# Patient Record
Sex: Male | Born: 1955 | Race: Black or African American | Hispanic: No | Marital: Married | Smoking: Former smoker
Health system: Southern US, Community
[De-identification: ages and names within clinical notes are randomized; demographics above are authoritative.]

## PROBLEM LIST (undated history)

## (undated) DIAGNOSIS — I10 Essential (primary) hypertension: Secondary | ICD-10-CM

## (undated) DIAGNOSIS — J9601 Acute respiratory failure with hypoxia: Secondary | ICD-10-CM

## (undated) DIAGNOSIS — I639 Cerebral infarction, unspecified: Secondary | ICD-10-CM

## (undated) HISTORY — DX: Cerebral infarction, unspecified: I63.9

## (undated) HISTORY — PX: SHOULDER SURGERY: SHX246

---

## 1997-12-02 ENCOUNTER — Encounter: Admission: RE | Admit: 1997-12-02 | Discharge: 1997-12-02 | Payer: Self-pay | Admitting: Family Medicine

## 1997-12-19 ENCOUNTER — Encounter: Admission: RE | Admit: 1997-12-19 | Discharge: 1997-12-19 | Payer: Self-pay | Admitting: Family Medicine

## 1997-12-31 ENCOUNTER — Encounter: Payer: Self-pay | Admitting: Cardiology

## 1997-12-31 ENCOUNTER — Ambulatory Visit (HOSPITAL_COMMUNITY): Admission: RE | Admit: 1997-12-31 | Discharge: 1997-12-31 | Payer: Self-pay | Admitting: Cardiology

## 1998-03-11 ENCOUNTER — Encounter: Admission: RE | Admit: 1998-03-11 | Discharge: 1998-03-11 | Payer: Self-pay | Admitting: Family Medicine

## 1998-05-15 ENCOUNTER — Encounter: Admission: RE | Admit: 1998-05-15 | Discharge: 1998-05-15 | Payer: Self-pay

## 1998-05-30 ENCOUNTER — Emergency Department (HOSPITAL_COMMUNITY): Admission: EM | Admit: 1998-05-30 | Discharge: 1998-05-30 | Payer: Self-pay | Admitting: Emergency Medicine

## 1998-06-04 ENCOUNTER — Encounter: Admission: RE | Admit: 1998-06-04 | Discharge: 1998-06-04 | Payer: Self-pay | Admitting: Family Medicine

## 1998-07-02 ENCOUNTER — Encounter: Admission: RE | Admit: 1998-07-02 | Discharge: 1998-07-02 | Payer: Self-pay | Admitting: Family Medicine

## 1998-07-09 ENCOUNTER — Encounter: Admission: RE | Admit: 1998-07-09 | Discharge: 1998-07-09 | Payer: Self-pay | Admitting: Family Medicine

## 1998-07-17 ENCOUNTER — Encounter: Admission: RE | Admit: 1998-07-17 | Discharge: 1998-07-17 | Payer: Self-pay | Admitting: Family Medicine

## 1998-07-18 ENCOUNTER — Encounter: Admission: RE | Admit: 1998-07-18 | Discharge: 1998-07-18 | Payer: Self-pay | Admitting: Family Medicine

## 1998-08-04 ENCOUNTER — Encounter: Admission: RE | Admit: 1998-08-04 | Discharge: 1998-08-04 | Payer: Self-pay | Admitting: Family Medicine

## 2000-10-27 ENCOUNTER — Emergency Department (HOSPITAL_COMMUNITY): Admission: EM | Admit: 2000-10-27 | Discharge: 2000-10-27 | Payer: Self-pay | Admitting: Emergency Medicine

## 2000-10-27 ENCOUNTER — Encounter: Payer: Self-pay | Admitting: Emergency Medicine

## 2004-06-12 ENCOUNTER — Emergency Department (HOSPITAL_COMMUNITY): Admission: EM | Admit: 2004-06-12 | Discharge: 2004-06-12 | Payer: Self-pay | Admitting: Emergency Medicine

## 2004-06-14 ENCOUNTER — Ambulatory Visit: Payer: Self-pay | Admitting: Infectious Diseases

## 2004-06-14 ENCOUNTER — Inpatient Hospital Stay (HOSPITAL_COMMUNITY): Admission: EM | Admit: 2004-06-14 | Discharge: 2004-06-18 | Payer: Self-pay | Admitting: Emergency Medicine

## 2004-06-14 ENCOUNTER — Ambulatory Visit: Payer: Self-pay | Admitting: Internal Medicine

## 2004-06-15 ENCOUNTER — Encounter (INDEPENDENT_AMBULATORY_CARE_PROVIDER_SITE_OTHER): Payer: Self-pay | Admitting: Cardiology

## 2004-06-19 ENCOUNTER — Inpatient Hospital Stay (HOSPITAL_COMMUNITY): Admission: EM | Admit: 2004-06-19 | Discharge: 2004-06-22 | Payer: Self-pay | Admitting: Emergency Medicine

## 2004-06-24 ENCOUNTER — Ambulatory Visit: Payer: Self-pay | Admitting: Internal Medicine

## 2006-12-19 ENCOUNTER — Emergency Department (HOSPITAL_COMMUNITY): Admission: EM | Admit: 2006-12-19 | Discharge: 2006-12-20 | Payer: Self-pay | Admitting: Emergency Medicine

## 2010-04-05 ENCOUNTER — Encounter: Payer: Self-pay | Admitting: Neurology

## 2010-07-31 NOTE — Discharge Summary (Signed)
Luis, Johnson NO.:  [id number]   MEDICAL RECORD NO.:  [id number]          PATIENT TYPE:  INP   LOCATION:                               FACILITY:  [facility]   PHYSICIAN:  Madaline Guthrie, M.D.    DATE OF BIRTH:  [birth year]   DATE OF ADMISSION:  06/19/2004  DATE OF DISCHARGE:                                 DISCHARGE SUMMARY   PRIMARY CARE PHYSICIAN:  Judie Grieve, MD   DISCHARGE DIAGNOSES:  1. Nausea, vomiting, headache secondary to recent right parietal occipital      intracranial hemorrhage.  2. Hypertensive urgency most likely to primary.  3. Right parietal occipital intracranial hemorrhage secondary to      hypertension April 2006.  4. Hyperlipidemia, elevated LDL.  5. History of nausea, vomiting secondary to opiate induced ileus.  6. History of elevated creatinine level secondary to prerenal volume      depletion.  7. History of left shoulder giant rotator cuff tear.     DISCHARGE MEDICATIONS:  1. Norvasc 10 mg p.o. b.i.d.  2. HCTZ 25 mg p.o. daily.  3. Zocor 40 mg p.o. daily.  4. Phenergan 25 mg p.o. q.8h. p.r.n.  5. Dulcolax 15 mg p.o. daily p.r.n. constipation.  6. Dilaudid 4 mg p.o. q.4h. p.r.n. headache.  7. Depakote 500 mg p.o. b.i.d. for headache.     DISPOSITION AND FOLLOW-UP:  Patient is being discharged home in fair  condition to follow up with me, Dr. Shannan Harper on April 12 in [facility] and also has a follow-up with Dr. Pearlean Brownie on June 12 at [facility]  Neurology.  At the time of this follow-up need to follow up on his blood  pressure status on Norvasc and HCTZ, follow up on his BMET to follow up on  potassium level, his creatinine level, his clinical improvement on PT and OT  at home, his clinical improvement in nausea, vomiting, headache, left-sided  hemianopsia.   CONSULTS:  Neurology, Dr. Pearlean Brownie   PROCEDURES:  1. CT scan of the head without contrast which showed no significant change      from prior CT head done on  April 2.  No change in the right occipital      intracranial hematoma.  No new hemorrhage.  2. MRA of the renal arteries negative for renal artery stenosis, left      renal cyst, bilateral patent single renal artery.     HISTORY OF PRESENT ILLNESS:  Luis Johnson is a 55 year old African-American  male with past medical history significant for hypertension, hyperlipidemia  who was recently in the hospital and was diagnosed with right parietal  occipital intracranial hemorrhage.  Returned shortly after discharge with  the complaints of intractable headache and vomiting with associated  dizziness.  He says he went home at 4 p.m. from the hospital, lied down,  then awoke with severe pounding headache behind both eyes and emesis x3  without any hematemesis.  He also had complaints of blurry vision in both  eyes with some visual defect on the left.  His blood pressure check  at that  time in emergency department was 195/117.   PHYSICAL EXAMINATION:  VITAL SIGNS:  Pulse 93, blood pressure 195/117,  temperature 100.2, respiratory rate 22, O2 saturations 95% on room air.  GENERAL:  Within normal limits, in mild distress secondary to headache.  HEENT:  Eyes:  PERRLA.  Extraocular muscles intact.  Mild photophobia.  Left  hemianopsia.  ENT:  Oropharynx clear without any erythema or exudates.  Mucous membranes moist.  NECK:  Supple.  No JVD or carotid bruit.  RESPIRATORY:  Clear to auscultation bilaterally.  No wheezing, rhonchi, or  rales.  CVS:  Regular rate and rhythm.  S1, S2.  GASTROINTESTINAL:  Soft, nontender, nondistended abdomen.  Positive bowel  sounds.  EXTREMITIES:  No clubbing, cyanosis, edema.  SKIN:  Warm and dry.  LYMPH:  No lymphadenopathy.  MUSCULOSKELETAL:  No joint effusions or deformities.  NEUROLOGIC:  No focal deficits.  Alert, awake, oriented x3.  Left temporal  hemianopsia.   ADMISSION LABORATORIES:  BMET with sodium 136, potassium 3.8, chloride 103,  BUN 13,  creatinine 1, glucose 108, bicarbonate 27.  CT scan of the head:  No  evidence of new bleed or CVA extension.   HOSPITAL COURSE:  #1 - HEADACHE WITH NAUSEA AND VOMITING WITH RECENT HISTORY  OF INTRACRANIAL HEMORRHAGE:  Was worrisome for new bleed of extension of the  prior bleed in the right parietal occipital region.  For this reason I did a  stat head CT which was negative for the above.  Headache was thought to be  most likely secondary to persisting irrigation of the dura from the existing  hematoma leading to pain and nausea and vomiting and also could have lead to  elevated blood pressure.  Given his history of vertigo along with headache  and nausea, also thought about vertigo but since he had a recent MRA/MRI in  April 3 which demonstrated wide patent vertebrobasilar system unlikely to be  vertigo.  His left homonomous hemianopsia was stable, did not have any new  focal deficits.  Treated him symptomatically with Phenergan and pain  management with Dilaudid and IV fluids for dehydration.  Monitored him  overnight.  Remained stable.  Nausea and vomiting resolved the next day and  he was discharged home in fair condition.   #2 - HYPERTENSIVE URGENCY:  [name] has recent history of intracerebral  hemorrhage which was thought most likely secondary to hypertension since his  work-up for thromboembolic phenomenon was negative.  Were concerned about  sudden elevation in the blood pressure in spite of medication with Norvasc,  HCTZ so at that point went ahead and did work-up of secondary causes  including renal artery stenosis.  MRA renal arteries was negative for renal  artery stenosis.  Pheochromocytoma work-up was done checking for urine  metanephrine and catecholamine.  Urine metanephrine was 950.  Normetanephrine was slightly elevated at 988, normal range being 50-650 and urine epinephrine was also slightly elevated at 107, normal range below 100.  These slight elevations are  associated with conditions such as emotional  stress, drug interferences, are not significantly high enough to consider  pheochromocytoma.  He was treated with a dose of labetalol in the emergency  department.  His blood pressure brought under pretty good control and later  he was continued on his home medication that is Norvasc 10 b.i.d. and  hydrochlorothiazide with good control of the blood pressure throughout his  hospitalization and was further discharged on the same medication.  The  sudden elevation in the blood pressure could most likely be secondary to  pain, headache, and nausea.  Later he was followed up in the clinic.  At  that time his blood pressure was pretty stable.   #3 - HYPERLIPIDEMIA:  Was continued on Zocor.   #4 - HEADACHE:  Most likely secondary to intracranial hematoma and dural  irritation.  His pain was not well controlled with opiates including  Dilaudid.  He was started on Depakote per neurology recommendation by Dr.  Pearlean Brownie.  Patient had very good control of the pain on Depakote and he was to  be continued on the medication for a couple of months until his headache is  under good control.   Discharge vital signs:  Blood pressure 150/80, heart rate 90, temperature  98.9, O2 saturations 99% on room air.  BMET showed sodium 135, potassium  4.2, chloride 94, BUN 29, creatinine 1.2.   DISCHARGE LABORATORIES:  Urine for catecholamines:  Urine norepinephrine  107, slightly high; urine epinephrine 20, within normal range; urine  dopamine 240, within normal range; urine metanephrine 950 mL with 220,  within normal limits; urine normetanephrine 988, slightly elevated, normal  range 50-650.      ________________________________________  Judie Grieve, MD  ___________________________________________  Madaline Guthrie, M.D.    SY/MEDQ  D:  08/12/2004  T:  08/12/2004  Job:  [id number]   cc:   [facility]   Pramod P. Pearlean Brownie, MD  Fax: [phone]
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
## 2010-07-31 NOTE — Discharge Summary (Signed)
Luis Johnson, Luis Johnson NO.:  [id number]   MEDICAL RECORD NO.:  [id number]          PATIENT TYPE:  INP   LOCATION:  5522                         FACILITY:  [facility]   PHYSICIAN:  Madaline Guthrie, M.D.    DATE OF BIRTH:  1955/09/09   DATE OF ADMISSION:  06/14/2004  DATE OF DISCHARGE:  06/17/2004                                 DISCHARGE SUMMARY   PRIMARY CARE PHYSICIAN:  [facility].   DISCHARGE DIAGNOSES:  1.  Right parietooccipital intracranial hemorrhage, most likely secondary to      hypertension.  2.  Hypertension.  3.  Hyperlipidemia, elevated LDL.  4.  Nausea, vomiting secondary to opiate-induced ileus.  5.  Increased creatinine level secondary to prerenal volume depletion.  6.  History of left shoulder giant rotator cuff tear.   DISCHARGE MEDICATIONS:  1.  Norvasc 10 mg p.o. daily.  2.  HCTZ 25 mg p.o. daily.  3.  Zocor 40 mg p.o. daily.  4.  Phenergan 25 mg p.o. q.8h. p.r.n.  5.  Dulcolax 15 mg p.o. daily p.r.n. constipation.  6.  Dilaudid 4 mg p.o. q.4h. p.r.n. headache.   DISPOSITION AND FOLLOW UP:  Luis Johnson has been discharged home in stable  condition to follow up with Dr. Shannan Harper, in [facility]  on April 12, Wednesday, at 2 p.m.  At that time, need to follow up on  following issues:  1.  His blood pressure.  With a goal systolic blood pressure around 130.  2.  His BMET to follow up on the potassium level.  3.  His creatinine level.  4.  His clinical improvement on PT and OT at home.  5.  His clinical improvement on nausea, vomiting, headache left-sided,      hemianopsia.   CONSULTATIONS:  Neurology, Dr. Sharene Skeans, Dr. Pearlean Brownie.  </PROCEDURES>  1.  CT head without contrast, April 2, showed 4 cm right occipital acute      intracranial hemorrhage with some surrounding edema.  2.  MRI done without contrast done on 3rd April, showed approximately 4 cm      right occipital bleed with surrounding edema, multiple __________   hypertensive-related hemorrhage are seen including microvascular      ischemic change in the white matter along with prominent perivascular      spaces.  No abnormal enhancement to suggest hemorrhagic metastases are      AV malformation.  3.  MR angiogram head without contrast showed mild intracranial      atherosclerotic disease without evidence for vascular malformation or      significant proximal occlusion.  4.  Repeat CT done on 4th April showed stable intraparenchymal hemorrhage      and mild associated edema in the right parietooccipital region.  5.  Bilateral carotid Dopplers done on June 16, 2004, showed right-sided      mild intimal wall thickening in the CCA and left mild intimal wall      thickening in common carotid artery, tortuosity of the proximal and      distal internal carotid artery and bilaterally  no internal carotid      artery stenosis.  Vertebral artery flow antegrade.  6.  2 D echocardiogram done showed overall left ventricular systolic      function within normal limits, left ventricular ejection fraction 55-      65%, no regional wall abnormalities, left ventricular thickness mildly      increased, moderate asymmetric septal hypertrophy.   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  It felt like something hit his left eye.  Pain started  aching around 6-9 intensity, coming around back of the eye, continuous, then  he started noticing visual deficit in his left eye.  He pulled over and  called his wife and was brought to the ER by his wife on that day, that is  June 12, 2004, 2 days prior to admission.  Referred to the ophthalmologist,  eye exam was completely normal and was again seen back  in the ER on the day  of admission with complaints of nausea, vomiting from the day prior 3 times  with speckles of blood in the vomitus.  Otherwise, he did not mention about  any fever, difficulty walking, or weakness in his arms, difficulty  swallowing, slurry speech, or trouble speaking.  He does not have any prior  history of similar episodes in the past.   PHYSICAL EXAMINATION:  ADMISSION VITAL SIGNS:  Pulse 93, blood pressure  173/105, temperature 98.1, respiratory rate 20, O2 saturations 99% on room  air.  GENERAL EXAMINATION:  No acute distress.  EYES:  PERRLA.  Extraocular muscles intact.  ENT:  Oropharynx is clear.  No lesions.  NECK:  Supple, no lymphadenopathy, no JVD.  CHEST:  Clear to auscultation bilaterally.  CVS:  S1, S2, regular rate and rhythm.  No murmur, gallops, or rubs  appreciated.  GI:  Soft, nontender, nondistended abdomen.  Positive bowel sounds.  EXTREMITIES:  No edema, cyanosis, or clubbing.  SKIN:  No lesions appreciated.  LYMPHADENOPATHY:  Negative.  NEUROLOGIC:  He is alert, awake, oriented x 3.  Muscle strength 5/5 in all  extremities.  Sensation is intact.  CRANIAL NERVE EXAMINATION:  Significant for left temporal peripheral  hemianopsia, Babinski's negative.  Vibration and position sense intact.   ADMISSION LABORATORY DATA:  CBC showed hemoglobin 15.3, white count 4.7,  thrombocytes 244, MCV 79.9, __________ showed sodium 135, potassium 3.7,  chloride 106, bicarbonate 23, BUN 5, creatinine 0.9, glucose 105, PT 12.1,  INR 0.9.  CT of head without contrast showed 4 cm right occipital acute  intraparenchymal hemorrhage.  Liver function tests within normal limits.   HOSPITAL COURSE BY ACTIVE PROBLEM LIST:  #1 - RIGHT PARIETOOCCIPITAL  HEMORRHAGE:  Most likely subacute in nature given history of headache,  nausea, vomiting since past 2-3 days and surrounding edema on the CT scan. Neurology consult was done for further management and care.  Given his  age,  there was the possibility of multiple etiologies including hypertension or  ischemic secondary to embolic-atheroembolic phenomenon.  For this reason, he  had a thorough work-up including 2 D echocardiogram, carotid Dopplers, and  hypercoagulability and MRI/MRA to look for cut off in the right posterior  middle cerebral artery branch that would suggest that the hemorrhage was  primarily hemorrhagic or ischemic.  MRA to rule out cerebral artery  aneurysms and minute hemorrhages or other smaller lesions or tumors.  Homocystine level was checked which was normal limits.  Carotid Dopplers  were done which did show atherosclerotic changes.  MRA showed significant  relation to hypertensive etiology for the hemorrhage countering the white  matter changes and prominent perivascular spaces.  2 D echocardiogram was  done which showed normal ejection fraction.  We have considered  transesophageal echocardiogram and Doppler bubble study if embolic  phenomenon was suspected but given the clear picture of hypertensive  etiology, did not do further work-up in this regard.  His blood pressure was  mildly elevated at the time of admission.  He was started on Norvasc.  His  blood pressure came down to 150s in systolic.  Given the subacute nature of  the hemorrhage, started him on HCTZ to control his blood pressure slowly  which came down to 130-140s systolic, and we will continue him on this same  medication after discharge with a blood pressure goal of 130s systolic.  Also routine screening was done for all the risk factors including a fasting  lipid profile which showed elevated cholesterol, so he was started on  statin, Zocor 40 mg daily.  Hemoglobin A1c was also checked which was normal  at 5.9.  Given his risk for recurrent hemorrhage, at this time did not start  him on aspirin, but this needs to be followed up in future.  After 4-6  weeks, he needs to be started on baby aspirin 81 mg daily.  Over  the next  couple of days, he also had a few problems with nausea, vomiting, and severe  headache.  He was started on Phenergan p.r.n. for nausea, vomiting and also  tried on Darvocet and IV morphine for headache, but there was a suspicion of  ileus and nausea and vomiting secondary to that because of the opiate use.  Morphine and Darvocet were discontinued, and he was started on Dilaudid IV,  later on switched to p.o.  His nausea, vomiting, and headache improved over  time, and he was stable at the time of discharge.  He is going to follow up  with me, Dr. Shannan Harper, in the outpatient clinic on April 12 and also with  Dr. Pearlean Brownie in neurology after 2 months.  Also arranged for PT and OT during  his hospital stay.  He did pretty good regarding this matter given his left  temporal hemianopsia which did improve during his hospital stay.  Would  continue him on PT and OT therapy after discharge.  Arranged for home health care for that reason and will follow up on that.   #2 - HYPERTENSION:  Again, a goal of systolic blood pressure at this time.  Given subacute nature, his blood pressure was slowly controlled.  He was  started on Norvasc.  Later on, HCTZ was added the next day which remained  pretty control in 130s systolic, and we will continue him on the same  medication at the time of discharge.   #3 - HYPERLIPIDEMIA:  Elevated LDL.  Started him on Zocor and will discharge  him on the same medication.   #4 - NAUSEA, VOMITING SECONDARY TO NARCOTIC-INDUCED ILEUS:  Morphine and  Darvocet were discontinued.  He was put on Phenergan for symptomatic relief,  also started him on Dilaudid for headache.  His nausea and vomiting resolved  over time and was in pretty good shape at  time of discharge.  A CT scan of  the head was repeated to rule out cerebellar edema or signs of increased  intracranial pressure, concerned about his nausea, which showed stable  hematoma and no significant change.   #5 -  ACUTE RENAL FAILURE:  Most likely secondary to prerenal failure  secondary to volume depletion from nausea, vomiting.  His creatinine level  bumped up from 0.8 to 1.6.  He was started on IV fluids, normal saline, put  on Phenergan for nausea, vomiting.  His nausea, vomiting improved over time.  He received IV fluids.      SY/MEDQ  D:  06/17/2004  T:  06/17/2004  Job:  [id number]   cc:   [facility]   Pramod P. Pearlean Brownie, MD  Fax: [phone]

## 2013-05-20 ENCOUNTER — Emergency Department: Payer: Self-pay | Admitting: Emergency Medicine

## 2014-01-21 ENCOUNTER — Emergency Department (HOSPITAL_COMMUNITY)
Admission: EM | Admit: 2014-01-21 | Discharge: 2014-01-21 | Disposition: A | Discharge status: Home / Self Care | Payer: BC Managed Care – PPO | Attending: Emergency Medicine | Admitting: Emergency Medicine

## 2014-01-21 ENCOUNTER — Emergency Department (HOSPITAL_COMMUNITY): Payer: BC Managed Care – PPO

## 2014-01-21 ENCOUNTER — Encounter (HOSPITAL_COMMUNITY): Payer: Self-pay | Admitting: Emergency Medicine

## 2014-01-21 DIAGNOSIS — R0602 Shortness of breath: Secondary | ICD-10-CM

## 2014-01-21 DIAGNOSIS — J439 Emphysema, unspecified: Secondary | ICD-10-CM | POA: Diagnosis not present

## 2014-01-21 DIAGNOSIS — R51 Headache: Secondary | ICD-10-CM | POA: Diagnosis present

## 2014-01-21 DIAGNOSIS — I10 Essential (primary) hypertension: Secondary | ICD-10-CM | POA: Insufficient documentation

## 2014-01-21 DIAGNOSIS — Z87891 Personal history of nicotine dependence: Secondary | ICD-10-CM | POA: Diagnosis not present

## 2014-01-21 HISTORY — DX: Essential (primary) hypertension: I10

## 2014-01-21 LAB — BASIC METABOLIC PANEL
ANION GAP: 16 — AB (ref 5–15)
BUN: 12 mg/dL (ref 6–23)
CO2: 22 mEq/L (ref 19–32)
Calcium: 9.6 mg/dL (ref 8.4–10.5)
Chloride: 100 mEq/L (ref 96–112)
Creatinine, Ser: 0.78 mg/dL (ref 0.50–1.35)
GLUCOSE: 124 mg/dL — AB (ref 70–99)
POTASSIUM: 3.8 meq/L (ref 3.7–5.3)
SODIUM: 138 meq/L (ref 137–147)

## 2014-01-21 LAB — CBC WITH DIFFERENTIAL/PLATELET
BASOS ABS: 0 10*3/uL (ref 0.0–0.1)
Basophils Relative: 0 % (ref 0–1)
EOS ABS: 0 10*3/uL (ref 0.0–0.7)
Eosinophils Relative: 0 % (ref 0–5)
HCT: 47.8 % (ref 39.0–52.0)
Hemoglobin: 16.1 g/dL (ref 13.0–17.0)
LYMPHS ABS: 1.1 10*3/uL (ref 0.7–4.0)
LYMPHS PCT: 12 % (ref 12–46)
MCH: 26.9 pg (ref 26.0–34.0)
MCHC: 33.7 g/dL (ref 30.0–36.0)
MCV: 79.9 fL (ref 78.0–100.0)
Monocytes Absolute: 0.8 10*3/uL (ref 0.1–1.0)
Monocytes Relative: 9 % (ref 3–12)
NEUTROS PCT: 79 % — AB (ref 43–77)
Neutro Abs: 7.2 10*3/uL (ref 1.7–7.7)
PLATELETS: 253 10*3/uL (ref 150–400)
RBC: 5.98 MIL/uL — AB (ref 4.22–5.81)
RDW: 14.4 % (ref 11.5–15.5)
WBC: 9.2 10*3/uL (ref 4.0–10.5)

## 2014-01-21 MED ORDER — PREDNISONE 20 MG PO TABS
60.0000 mg | ORAL_TABLET | Freq: Once | ORAL | Status: AC
  Administered 2014-01-21: 60 mg via ORAL
  Filled 2014-01-21: qty 3

## 2014-01-21 MED ORDER — AZITHROMYCIN 250 MG PO TABS
500.0000 mg | ORAL_TABLET | Freq: Once | ORAL | Status: AC
  Administered 2014-01-21: 500 mg via ORAL
  Filled 2014-01-21: qty 2

## 2014-01-21 MED ORDER — MAGNESIUM SULFATE 2 GM/50ML IV SOLN
2.0000 g | Freq: Once | INTRAVENOUS | Status: AC
  Administered 2014-01-21: 2 g via INTRAVENOUS
  Filled 2014-01-21: qty 50

## 2014-01-21 MED ORDER — IPRATROPIUM BROMIDE 0.02 % IN SOLN
1.0000 mg | Freq: Once | RESPIRATORY_TRACT | Status: AC
  Administered 2014-01-21: 1 mg via RESPIRATORY_TRACT
  Filled 2014-01-21: qty 5

## 2014-01-21 MED ORDER — SODIUM CHLORIDE 0.9 % IV BOLUS (SEPSIS)
1000.0000 mL | Freq: Once | INTRAVENOUS | Status: AC
  Administered 2014-01-21: 1000 mL via INTRAVENOUS

## 2014-01-21 MED ORDER — ACETAMINOPHEN-CODEINE #3 300-30 MG PO TABS
2.0000 | ORAL_TABLET | Freq: Once | ORAL | Status: AC
  Administered 2014-01-21: 2 via ORAL
  Filled 2014-01-21: qty 2

## 2014-01-21 MED ORDER — AZITHROMYCIN 250 MG PO TABS: 250.0000 mg | ORAL_TABLET | Freq: Every day | ORAL | Status: DC

## 2014-01-21 MED ORDER — PREDNISONE 20 MG PO TABS: 60.0000 mg | ORAL_TABLET | Freq: Every day | ORAL | Status: DC

## 2014-01-21 MED ORDER — ALBUTEROL (5 MG/ML) CONTINUOUS INHALATION SOLN
10.0000 mg/h | INHALATION_SOLUTION | RESPIRATORY_TRACT | Status: DC
  Filled 2014-01-21: qty 20

## 2014-01-21 MED ORDER — IPRATROPIUM BROMIDE 0.02 % IN SOLN
0.5000 mg | Freq: Once | RESPIRATORY_TRACT | Status: DC
  Filled 2014-01-21: qty 2.5

## 2014-01-21 NOTE — ED Provider Notes (Signed)
7:09 AM Patient signed out to me by Dr. Mora Bellmanni. Patient currently receiving hour-long nebulizer. Patient's chest xray unremarkable for acute changes. Patient will be re-evaluated after nebulizer treatment.   10:18 AM Patient reports initially "feeling the same" after his nebulizer. Patient requested to be observed for a bit in the ED. Patient is now "feeling much better" and is able to ambulate without feeling short of breath. Patient will be discharged without further evaluation. Patient instructed to return with worsening or concerning symptoms.   Results for orders placed or performed during the hospital encounter of 01/21/14  CBC with Differential  Result Value Ref Range   WBC 9.2 4.0 - 10.5 K/uL   RBC 5.98 (H) 4.22 - 5.81 MIL/uL   Hemoglobin 16.1 13.0 - 17.0 g/dL   HCT 16.147.8 09.639.0 - 04.552.0 %   MCV 79.9 78.0 - 100.0 fL   MCH 26.9 26.0 - 34.0 pg   MCHC 33.7 30.0 - 36.0 g/dL   RDW 40.914.4 81.111.5 - 91.415.5 %   Platelets 253 150 - 400 K/uL   Neutrophils Relative % 79 (H) 43 - 77 %   Neutro Abs 7.2 1.7 - 7.7 K/uL   Lymphocytes Relative 12 12 - 46 %   Lymphs Abs 1.1 0.7 - 4.0 K/uL   Monocytes Relative 9 3 - 12 %   Monocytes Absolute 0.8 0.1 - 1.0 K/uL   Eosinophils Relative 0 0 - 5 %   Eosinophils Absolute 0.0 0.0 - 0.7 K/uL   Basophils Relative 0 0 - 1 %   Basophils Absolute 0.0 0.0 - 0.1 K/uL  Basic metabolic panel  Result Value Ref Range   Sodium 138 137 - 147 mEq/L   Potassium 3.8 3.7 - 5.3 mEq/L   Chloride 100 96 - 112 mEq/L   CO2 22 19 - 32 mEq/L   Glucose, Bld 124 (H) 70 - 99 mg/dL   BUN 12 6 - 23 mg/dL   Creatinine, Ser 7.820.78 0.50 - 1.35 mg/dL   Calcium 9.6 8.4 - 95.610.5 mg/dL   GFR calc non Af Amer >90 >90 mL/min   GFR calc Af Amer >90 >90 mL/min   Anion gap 16 (H) 5 - 15   Dg Chest Port 1 View  01/21/2014   CLINICAL DATA:  Shortness of breath.  Emphysema.  EXAM: PORTABLE CHEST - 1 VIEW  COMPARISON:  PA and lateral chest 06/14/2004.  FINDINGS: The lungs are emphysematous but appear  clear. Heart size is normal. No pneumothorax pleural effusion.  IMPRESSION: Emphysema without acute disease.   Electronically Signed   By: Drusilla Kannerhomas  Dalessio M.D.   On: 01/21/2014 06:30      Emilia BeckKaitlyn Shanette Tamargo, PA-C 01/21/14 1019  Tomasita CrumbleAdeleke Oni, MD 01/21/14 1627

## 2014-01-21 NOTE — ED Notes (Signed)
Patient here with complaint of cough, shortness of breath, blurred vision, and headache. All of which began Friday. Denies chest pain, nausea, vomiting.

## 2014-01-21 NOTE — ED Notes (Signed)
Pt's hour neb is complete-- pt states he is still congested and not ready to go home. Dr. Patria Maneampos notified.

## 2014-01-21 NOTE — Discharge Instructions (Signed)
Emphysema Luis Johnson, you were seen for cough and wheezing. Continue to take medication as prescribed. Follow up with your primary care doctor within 3 days for continued treatment. If any of your symptoms worsen come back to emergency department immediately for repeat evaluation. Thank you.   Chronic obstructive pulmonary disease (COPD) is a common lung problem. In COPD, the flow of air from the lungs is limited. COPD exacerbations are times that breathing gets worse and you need extra treatment. Without treatment they can be life threatening. If they happen often, your lungs can become more damaged. HOME CARE  Do not smoke.  Avoid tobacco smoke and other things that bother your lungs.  If given, take your antibiotic medicine as told. Finish the medicine even if you start to feel better.  Only take medicines as told by your doctor.  Drink enough fluids to keep your pee (urine) clear or pale yellow (unless your doctor has told you not to).  Use a cool mist machine (vaporizer).  If you use oxygen or a machine that turns liquid medicine into a mist (nebulizer), continue to use them as told.  Keep up with shots (vaccinations) as told by your doctor.  Exercise regularly.  Eat healthy foods.  Keep all doctor visits as told. GET HELP RIGHT AWAY IF:  You are very short of breath and it gets worse.  You have trouble talking.  You have bad chest pain.  You have blood in your spit (sputum).  You have a fever.  You keep throwing up (vomiting).  You feel weak, or you pass out (faint).  You feel confused.  You keep getting worse. MAKE SURE YOU:   Understand these instructions.  Will watch your condition.  Will get help right away if you are not doing well or get worse. Document Released: 02/18/2011 Document Revised: 12/20/2012 Document Reviewed: 11/03/2012 [facility] Patient Information 2015 [place], [place]. This information is not intended to replace advice given to you by  your health care provider. Make sure you discuss any questions you have with your health care provider.

## 2014-01-21 NOTE — ED Notes (Signed)
Kaitlyn, PA in to see pt.

## 2014-01-21 NOTE — ED Provider Notes (Signed)
CSN: [id number]     Arrival date & time 01/21/14  0539 History   None    Chief Complaint  Patient presents with  . Headache  . Cough     (Consider location/radiation/quality/duration/timing/severity/associated sxs/prior Treatment) HPI  Antionette Polesony Freimark is a 58 y.o. male with past medical history of hypertension, emphysema coming in with coughing and shortness of breath. Patient states this began on November 6. His symptoms including subjective fevers and chills, productive cough, shortness of breath. He previously had a viral URI and now his symptoms have gotten worse. He states he rarely has problems with his emphysema. 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 He denies any  chest pain, nausea, vomiting. He is not taking any medication to help his symptoms. Coughing makes his shortness of breath worse. He is now developing associated headache as well. Patient did not describe any blurry vision to meet despite triage notes. Patient has no further complaints.  10 Systems reviewed and are negative for acute change except as noted in the HPI.   Past Medical History  Diagnosis Date  . Hypertension    No past surgical history on file. No family history on file. History  Substance Use Topics  . Smoking status: Former Smoker    Quit date: 01/21/1994  . Smokeless tobacco: Not on file  . Alcohol Use: No    Review of Systems    Allergies  Eggs or egg-derived products  Home Medications   Prior to Admission medications   Not on File   BP 151/96 mmHg  Pulse 102  Temp(Src) 99 F (37.2 C) (Oral)  Ht 5\' 7"  (1.702 m)  Wt 160 lb (72.576 kg)  BMI 25.05 kg/m2  SpO2 95% Physical Exam  Constitutional: He is oriented to person, place, and time. Vital signs are normal. He appears well-developed and well-nourished.  Non-toxic appearance. He does not appear ill. No distress.  HENT:  Head: Normocephalic and atraumatic.  Nose: Nose normal.  Mouth/Throat: Oropharynx is clear and  moist. No oropharyngeal exudate.  Eyes: Conjunctivae and EOM are normal. Pupils are equal, round, and reactive to light. No scleral icterus.  Neck: Normal range of motion. Neck supple. No tracheal deviation, no edema, no erythema and normal range of motion present. No thyroid mass and no thyromegaly present.  Cardiovascular: Normal rate, regular rhythm, S1 normal, S2 normal, normal heart sounds, intact distal pulses and normal pulses.  Exam reveals no gallop and no friction rub.   No murmur heard. Pulses:      Radial pulses are 2+ on the right side, and 2+ on the left side.       Dorsalis pedis pulses are 2+ on the right side, and 2+ on the left side.  Pulmonary/Chest: Effort normal. No respiratory distress. He has wheezes. He has no rhonchi. He has no rales.  Inspiratory and expiratory rhonchi heard bilateral lung fields.  Abdominal: Soft. Normal appearance and bowel sounds are normal. He exhibits no distension, no ascites and no mass. There is no hepatosplenomegaly. There is no tenderness. There is no rebound, no guarding and no CVA tenderness.  Musculoskeletal: Normal range of motion. He exhibits no edema or tenderness.  Lymphadenopathy:    He has no cervical adenopathy.  Neurological: He is alert and oriented to person, place, and time. He has normal strength. No cranial nerve deficit or sensory deficit. He exhibits normal muscle tone. GCS eye subscore is 4. GCS verbal subscore is 5. GCS motor subscore is  6.  Skin: Skin is warm, dry and intact. No petechiae and no rash noted. He is not diaphoretic. No erythema. No pallor.  Psychiatric: He has a normal mood and affect. His behavior is normal. Judgment normal.  Nursing note and vitals reviewed.   ED Course  Procedures (including critical care time) Labs Review Labs Reviewed  CBC WITH DIFFERENTIAL - Abnormal; Notable for the following:    RBC 5.98 (*)    Neutrophils Relative % 79 (*)    All other components within normal limits  BASIC  METABOLIC PANEL - Abnormal; Notable for the following:    Glucose, Bld 124 (*)    Anion gap 16 (*)    All other components within normal limits    Imaging Review Dg Chest Port 1 View  01/21/2014   CLINICAL DATA:  Shortness of breath.  Emphysema.  EXAM: PORTABLE CHEST - 1 VIEW  COMPARISON:  PA and lateral chest 06/14/2004.  FINDINGS: The lungs are emphysematous but appear clear. Heart size is normal. No pneumothorax pleural effusion.  IMPRESSION: Emphysema without acute disease.   Electronically Signed   By: Drusilla Kannerhomas  Dalessio M.D.   On: 01/21/2014 06:30     EKG Interpretation None      MDM   Final diagnoses:  SOB (shortness of breath)    Patient presents emergency department for shortness of breath and coughing. I have suspicion for pneumonia as the patient had a viral URI then suddenly got worse. Really does not have problems with his emphysema but is wheezing and has rhonchi today. We'll treat aggressively with breathing treatments, steroids, magnesium. Chest x-ray is pending. Was given Tylenol 3 with codeine for his fever, pain, and cough.  Patient signed out to Dr. Patria Maneampos and PA.  Finish breathing treatment and likely will be able to bo DC'ed.  I completed DC paperwork with follow up, prednisone and azithromycin course.    Tomasita CrumbleAdeleke Sequoia Witz, MD 01/21/14 [phone]

## 2014-11-11 ENCOUNTER — Emergency Department (HOSPITAL_COMMUNITY): Payer: BLUE CROSS/BLUE SHIELD

## 2014-11-11 ENCOUNTER — Encounter (HOSPITAL_COMMUNITY): Payer: Self-pay | Admitting: Emergency Medicine

## 2014-11-11 ENCOUNTER — Inpatient Hospital Stay (HOSPITAL_COMMUNITY)
Admission: EM | Admit: 2014-11-11 | Discharge: 2014-11-13 | DRG: 064 | Disposition: A | Discharge status: Home / Self Care | Payer: BLUE CROSS/BLUE SHIELD | Attending: Neurology | Admitting: Neurology

## 2014-11-11 ENCOUNTER — Inpatient Hospital Stay (HOSPITAL_COMMUNITY): Payer: BLUE CROSS/BLUE SHIELD

## 2014-11-11 DIAGNOSIS — Z79899 Other long term (current) drug therapy: Secondary | ICD-10-CM

## 2014-11-11 DIAGNOSIS — I10 Essential (primary) hypertension: Secondary | ICD-10-CM | POA: Diagnosis present

## 2014-11-11 DIAGNOSIS — I609 Nontraumatic subarachnoid hemorrhage, unspecified: Secondary | ICD-10-CM | POA: Diagnosis present

## 2014-11-11 DIAGNOSIS — I6523 Occlusion and stenosis of bilateral carotid arteries: Secondary | ICD-10-CM | POA: Diagnosis present

## 2014-11-11 DIAGNOSIS — I611 Nontraumatic intracerebral hemorrhage in hemisphere, cortical: Principal | ICD-10-CM | POA: Diagnosis present

## 2014-11-11 DIAGNOSIS — I619 Nontraumatic intracerebral hemorrhage, unspecified: Secondary | ICD-10-CM

## 2014-11-11 DIAGNOSIS — E785 Hyperlipidemia, unspecified: Secondary | ICD-10-CM | POA: Diagnosis present

## 2014-11-11 DIAGNOSIS — Z87891 Personal history of nicotine dependence: Secondary | ICD-10-CM

## 2014-11-11 DIAGNOSIS — I639 Cerebral infarction, unspecified: Secondary | ICD-10-CM

## 2014-11-11 DIAGNOSIS — Z91012 Allergy to eggs: Secondary | ICD-10-CM | POA: Diagnosis not present

## 2014-11-11 DIAGNOSIS — I6789 Other cerebrovascular disease: Secondary | ICD-10-CM | POA: Diagnosis not present

## 2014-11-11 DIAGNOSIS — G936 Cerebral edema: Secondary | ICD-10-CM | POA: Diagnosis present

## 2014-11-11 LAB — CBC
HEMATOCRIT: 48.5 % (ref 39.0–52.0)
Hemoglobin: 15.9 g/dL (ref 13.0–17.0)
MCH: 26.8 pg (ref 26.0–34.0)
MCHC: 32.8 g/dL (ref 30.0–36.0)
MCV: 81.8 fL (ref 78.0–100.0)
Platelets: 257 10*3/uL (ref 150–400)
RBC: 5.93 MIL/uL — AB (ref 4.22–5.81)
RDW: 14.8 % (ref 11.5–15.5)
WBC: 4.5 10*3/uL (ref 4.0–10.5)

## 2014-11-11 LAB — DIFFERENTIAL
Basophils Absolute: 0 10*3/uL (ref 0.0–0.1)
Basophils Relative: 0 % (ref 0–1)
Eosinophils Absolute: 0.1 10*3/uL (ref 0.0–0.7)
Eosinophils Relative: 2 % (ref 0–5)
LYMPHS PCT: 32 % (ref 12–46)
Lymphs Abs: 1.5 10*3/uL (ref 0.7–4.0)
MONO ABS: 0.3 10*3/uL (ref 0.1–1.0)
MONOS PCT: 7 % (ref 3–12)
NEUTROS ABS: 2.6 10*3/uL (ref 1.7–7.7)
Neutrophils Relative %: 59 % (ref 43–77)

## 2014-11-11 LAB — I-STAT CHEM 8, ED
BUN: 19 mg/dL (ref 6–20)
CALCIUM ION: 1.19 mmol/L (ref 1.13–1.30)
Chloride: 105 mmol/L (ref 101–111)
Creatinine, Ser: 1 mg/dL (ref 0.61–1.24)
GLUCOSE: 93 mg/dL (ref 65–99)
HCT: 53 % — ABNORMAL HIGH (ref 39.0–52.0)
HEMOGLOBIN: 18 g/dL — AB (ref 13.0–17.0)
Potassium: 4.7 mmol/L (ref 3.5–5.1)
Sodium: 141 mmol/L (ref 135–145)
TCO2: 25 mmol/L (ref 0–100)

## 2014-11-11 LAB — COMPREHENSIVE METABOLIC PANEL
ALK PHOS: 69 U/L (ref 38–126)
ALT: 15 U/L — AB (ref 17–63)
AST: 19 U/L (ref 15–41)
Albumin: 3.8 g/dL (ref 3.5–5.0)
Anion gap: 7 (ref 5–15)
BUN: 12 mg/dL (ref 6–20)
CALCIUM: 9.4 mg/dL (ref 8.9–10.3)
CO2: 25 mmol/L (ref 22–32)
CREATININE: 0.98 mg/dL (ref 0.61–1.24)
Chloride: 108 mmol/L (ref 101–111)
Glucose, Bld: 96 mg/dL (ref 65–99)
Potassium: 4.7 mmol/L (ref 3.5–5.1)
Sodium: 140 mmol/L (ref 135–145)
TOTAL PROTEIN: 7.6 g/dL (ref 6.5–8.1)
Total Bilirubin: 0.7 mg/dL (ref 0.3–1.2)

## 2014-11-11 LAB — PROTIME-INR
INR: 0.98 (ref 0.00–1.49)
Prothrombin Time: 13.2 seconds (ref 11.6–15.2)

## 2014-11-11 LAB — I-STAT TROPONIN, ED: TROPONIN I, POC: 0 ng/mL (ref 0.00–0.08)

## 2014-11-11 LAB — APTT: aPTT: 27 seconds (ref 24–37)

## 2014-11-11 MED ORDER — LABETALOL HCL 5 MG/ML IV SOLN: 10.0000 mg | INTRAVENOUS | Status: DC | PRN

## 2014-11-11 MED ORDER — STROKE: EARLY STAGES OF RECOVERY BOOK
Freq: Once | Status: DC
  Filled 2014-11-11: qty 1

## 2014-11-11 MED ORDER — NICARDIPINE HCL IN NACL 20-0.86 MG/200ML-% IV SOLN
3.0000 mg/h | Freq: Once | INTRAVENOUS | Status: AC
  Administered 2014-11-11: 2.5 mg/h via INTRAVENOUS
  Filled 2014-11-11: qty 200

## 2014-11-11 MED ORDER — ACETAMINOPHEN 650 MG RE SUPP: 650.0000 mg | RECTAL | Status: DC | PRN

## 2014-11-11 MED ORDER — ACETAMINOPHEN 325 MG PO TABS: 650.0000 mg | ORAL_TABLET | ORAL | Status: DC | PRN

## 2014-11-11 MED ORDER — AMLODIPINE BESYLATE 5 MG PO TABS
10.0000 mg | ORAL_TABLET | Freq: Once | ORAL | Status: AC
  Administered 2014-11-11: 10 mg via ORAL
  Filled 2014-11-11: qty 2

## 2014-11-11 MED ORDER — SODIUM CHLORIDE 0.9 % IV SOLN: INTRAVENOUS | Status: DC

## 2014-11-11 MED ORDER — PANTOPRAZOLE SODIUM 40 MG IV SOLR
40.0000 mg | Freq: Every day | INTRAVENOUS | Status: DC
  Administered 2014-11-11: 40 mg via INTRAVENOUS
  Filled 2014-11-11 (×2): qty 40

## 2014-11-11 MED ORDER — BUTALBITAL-APAP-CAFFEINE 50-325-40 MG PO TABS
1.0000 | ORAL_TABLET | ORAL | Status: DC | PRN
  Administered 2014-11-11 – 2014-11-12 (×3): 1 via ORAL
  Filled 2014-11-11 (×3): qty 1

## 2014-11-11 MED ORDER — SENNOSIDES-DOCUSATE SODIUM 8.6-50 MG PO TABS
1.0000 | ORAL_TABLET | Freq: Two times a day (BID) | ORAL | Status: DC
  Administered 2014-11-12 – 2014-11-13 (×2): 1 via ORAL
  Filled 2014-11-11 (×5): qty 1

## 2014-11-11 NOTE — ED Notes (Signed)
Attempted report x1. 

## 2014-11-11 NOTE — ED Notes (Signed)
Pt taken to MRI  

## 2014-11-11 NOTE — ED Notes (Signed)
Meal tray ordered per EDP 

## 2014-11-11 NOTE — ED Notes (Signed)
Attempted report 

## 2014-11-11 NOTE — ED Notes (Signed)
Pt sts blurred vision and left arm numbness upon waking this am; no obvious deficits noted

## 2014-11-11 NOTE — ED Notes (Signed)
Verbal order received from Dr Leroy Kennedy to d/c carden drip

## 2014-11-11 NOTE — ED Provider Notes (Signed)
CSN: [id number]     Arrival date & time 11/11/14  1429 History   First MD Initiated Contact with Patient 11/11/14 1542     Chief Complaint  Patient presents with  . Blurred Vision  . Numbness     (Consider location/radiation/quality/duration/timing/severity/associated sxs/prior Treatment) Patient is a 59 y.o. male presenting with neurologic complaint.  Neurologic Problem This is a new problem. The current episode started yesterday. The problem occurs constantly. The problem has been gradually improving. Associated symptoms include headaches, numbness and a visual change. Pertinent negatives include no abdominal pain, arthralgias, chest pain, chills, congestion, fever, myalgias, nausea, sore throat, vomiting or weakness. Associated symptoms comments: Ataxia. Nothing aggravates the symptoms. He has tried nothing for the symptoms.    Past Medical History  Diagnosis Date  . Hypertension    History reviewed. No pertinent past surgical history. History reviewed. No pertinent family history. Social History  Substance Use Topics  . Smoking status: Former Smoker    Quit date: 01/21/1994  . Smokeless tobacco: None  . Alcohol Use: No    Review of Systems  Constitutional: Negative for fever and chills.  HENT: Negative for congestion and sore throat.   Eyes: Negative for visual disturbance.  Respiratory: Negative for shortness of breath and wheezing.   Cardiovascular: Negative for chest pain.  Gastrointestinal: Negative for nausea, vomiting, abdominal pain, diarrhea and constipation.  Genitourinary: Negative for dysuria and difficulty urinating.  Musculoskeletal: Negative for myalgias and arthralgias.  Skin: Negative for wound.  Neurological: Positive for speech difficulty, numbness and headaches. Negative for syncope and weakness.  Psychiatric/Behavioral: Negative for behavioral problems.  All other systems reviewed and are negative.     Allergies  Eggs or egg-derived  products  Home Medications   Prior to Admission medications   Medication Sig Start Date End Date Taking? Authorizing Provider  acetaminophen (TYLENOL) 325 MG tablet Take 650 mg by mouth every 6 (six) hours as needed for mild pain.   Yes Historical Provider, MD  amLODipine (NORVASC) 10 MG tablet Take 10 mg by mouth daily.   Yes Historical Provider, MD  atorvastatin (LIPITOR) 40 MG tablet Take 40 mg by mouth daily.   Yes Historical Provider, MD  albuterol-ipratropium (COMBIVENT) 18-103 MCG/ACT inhaler Inhale 1 puff into the lungs every 4 (four) hours as needed for wheezing or shortness of breath.    Historical Provider, MD  azithromycin (ZITHROMAX) 250 MG tablet Take 1 tablet (250 mg total) by mouth daily. Patient not taking: Reported on 11/11/2014 01/21/14   Tomasita Crumble, MD  predniSONE (DELTASONE) 20 MG tablet Take 3 tablets (60 mg total) by mouth daily. Patient not taking: Reported on 11/11/2014 01/21/14   Tomasita Crumble, MD   BP 155/94 mmHg  Pulse 98  Temp(Src) 98.9 F (37.2 C) (Oral)  Resp 18  SpO2 96% Physical Exam  Constitutional: He is oriented to person, place, and time. He appears well-developed and well-nourished.  HENT:  Head: Normocephalic and atraumatic.  Eyes: EOM are normal.  Neck: Normal range of motion.  Cardiovascular: Normal rate, regular rhythm and normal heart sounds.   No murmur heard. Pulmonary/Chest: Effort normal and breath sounds normal. No respiratory distress.  Abdominal: Soft. There is no tenderness.  Musculoskeletal: He exhibits no edema.  Neurological: He is alert and oriented to person, place, and time. He has normal strength and normal reflexes. A cranial nerve deficit and sensory deficit is present. He displays a negative Romberg sign. Coordination abnormal. Gait normal. GCS eye subscore is 4. GCS verbal  subscore is 5. GCS motor subscore is 6.  Skin: No rash noted. He is not diaphoretic.    ED Course  Procedures (including critical care time) Labs  Review Labs Reviewed  CBC - Abnormal; Notable for the following:    RBC 5.93 (*)    All other components within normal limits  COMPREHENSIVE METABOLIC PANEL - Abnormal; Notable for the following:    ALT 15 (*)    All other components within normal limits  I-STAT CHEM 8, ED - Abnormal; Notable for the following:    Hemoglobin 18.0 (*)    HCT 53.0 (*)    All other components within normal limits  MRSA PCR SCREENING  PROTIME-INR  APTT  DIFFERENTIAL  URINE RAPID DRUG SCREEN, HOSP PERFORMED  URINALYSIS, ROUTINE W REFLEX MICROSCOPIC (NOT AT [facility])  I-STAT TROPOININ, ED    Imaging Review Ct Head Wo Contrast  11/11/2014   CLINICAL DATA:  Loss of vision to both eyes and left arm numbness last night.  EXAM: CT HEAD WITHOUT CONTRAST  TECHNIQUE: Contiguous axial images were obtained from the base of the skull through the vertex without intravenous contrast.  COMPARISON:  December 19, 2006  FINDINGS: There is acute hemorrhage in the left parietal occipital lobe measuring 1.7 x 1.9 cm. There is generalized bilateral cerebral edema with low-density in the bilateral frontal/parietal and temporal white matter which should present on prior CT of 2008. Left basal ganglia calcification is unchanged compared to prior exam. There is no midline shift or hydrocephalus. The bony calvarium is intact. There is a right maxillary sinus retention cyst.  IMPRESSION: Acute hemorrhage involving the left parietal occipital lobe measuring 1.7 x 1.9 cm. Generalized changes of bilateral cerebrum as described. The findings raises the possibility of posterior reversible encephalopathy syndrome. Further evaluation with MRI is recommended.  These results were called by telephone at the time of interpretation on 11/11/2014 at 3:49 pm to Dr. Cathren Laine , who verbally acknowledged these results.   Electronically Signed   By: Sherian Rein M.D.   On: 11/11/2014 15:52   [name] Head Wo Contrast  11/11/2014   CLINICAL DATA:  59 year old  hypertensive male with headache and left arm numbness with slurred speech. Subsequent encounter.  EXAM: MRI HEAD WITHOUT CONTRAST  MRA HEAD WITHOUT CONTRAST  TECHNIQUE: Multiplanar, multiecho pulse sequences of the brain and surrounding structures were obtained without intravenous contrast. Angiographic images of the head were obtained using MRA technique without contrast.  COMPARISON:  11/11/2014, 12/19/2006 and 06/18/2004 head CT.  FINDINGS: MRI HEAD FINDINGS  Acute left occipital lobe 2.4 x 2 x 2.2 cm hematoma with surrounding vasogenic edema.  Remote insult with blood-stained encephalomalacia posterior inferior left temporal- occipital lobe.  Remote hemorrhage (noted as acute on 06/18/2004 CT) right parietal-occipital lobe and now with blood-stained encephalomalacia.  The cause of these areas of hemorrhage is indeterminate. In this hypertensive patient with significant peri vascular spaces and white matter type changes, findings may be related to hypertensive bleeds. Result of underlying vasculitis or amyloid angiopathy are possibilities. With involvement of the posterior circulation, posterior reversible encephalopathy syndrome with subsequent bleeds not entirely excluded although a secondary consideration.  The major dural sinuses appear patent and therefore hemorrhage from venous occlusion is a less likely consideration.  No hydrocephalus.  No intracranial mass lesion seen from the above described findings. Contrast was not administered.  Cerebellar tonsils minimally low lying but within the range of normal limits. Although there appears to be a slightly sagging appearance  of the pons, no other findings of intracranial hypotension.  Orbital structures unremarkable.  Polypoid complex opacification inferior right maxillary sinus.  MRA HEAD FINDINGS  Anterior circulation without medium or large size vessel significant stenosis or occlusion.  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  Ectatic codominant vertebral arteries with slight narrowing but without high-grade stenosis.  Mild irregularity posterior inferior cerebellar arteries.  Mild ectasia mild narrowing basilar artery.  Nonvisualized anterior inferior cerebellar arteries.  Posterior cerebral artery branch vessel irregularity bilaterally.  No abnormal vessels seen surrounding the left occipital lobe acute hemorrhage.  IMPRESSION: MRI HEAD FINDINGS  Acute left occipital lobe 2.4 x 2 x 2.2 cm hematoma with surrounding vasogenic edema.  Remote insult with blood-stained encephalomalacia posterior inferior left temporal- occipital lobe.  Remote hemorrhage (noted as acute on 06/18/2004 CT) right parietal-occipital lobe and now with blood-stained encephalomalacia.  The cause of these areas of hemorrhage is indeterminate. In this hypertensive patient with significant peri vascular spaces and white matter type changes, findings may be related to hypertensive bleeds. Result of underlying vasculitis or amyloid angiopathy are possibilities. With involvement of the posterior circulation, posterior reversible encephalopathy syndrome with subsequent bleeds not entirely excluded although a secondary consideration.  The major dural sinuses appear patent and therefore hemorrhage from venous occlusion is a less likely consideration.  Cerebellar tonsils minimally low lying but within the range of normal limits. Although there appears to be a slightly sagging appearance of the pons, no other findings of intracranial hypotension.  Polypoid complex opacification inferior right maxillary sinus.  MRA HEAD FINDINGS  No abnormal vessels seen surrounding the left occipital lobe acute hemorrhage.  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  Anterior circulation without medium or large size vessel significant stenosis or occlusion.  Ectatic codominant vertebral arteries with slight  narrowing but without high-grade stenosis.  Mild irregularity posterior inferior cerebellar arteries.  Mild ectasia mild narrowing basilar artery.  Nonvisualized anterior inferior cerebellar arteries.  Posterior cerebral artery branch vessel irregularity bilaterally.   Electronically Signed   By: Lacy Duverney M.D.   On: 11/11/2014 21:57   Mr Brain Wo Contrast  11/11/2014   CLINICAL DATA:  59 year old hypertensive male with headache and left arm numbness with slurred speech. Subsequent encounter.  EXAM: MRI HEAD WITHOUT CONTRAST  MRA HEAD WITHOUT CONTRAST  TECHNIQUE: Multiplanar, multiecho pulse sequences of the brain and surrounding structures were obtained without intravenous contrast. Angiographic images of the head were obtained using MRA technique without contrast.  COMPARISON:  11/11/2014, 12/19/2006 and 06/18/2004 head CT.  FINDINGS: MRI HEAD FINDINGS  Acute left occipital lobe 2.4 x 2 x 2.2 cm hematoma with surrounding vasogenic edema.  Remote insult with blood-stained encephalomalacia posterior inferior left temporal- occipital lobe.  Remote hemorrhage (noted as acute on 06/18/2004 CT) right parietal-occipital lobe and now with blood-stained encephalomalacia.  The cause of these areas of hemorrhage is indeterminate. In this hypertensive patient with significant peri vascular spaces and white matter type changes, findings may be related to hypertensive bleeds. Result of underlying vasculitis or amyloid angiopathy are possibilities. With involvement of the posterior circulation, posterior reversible encephalopathy syndrome with subsequent bleeds not entirely excluded although a secondary consideration.  The major dural sinuses appear patent and therefore hemorrhage from venous occlusion is a less likely consideration.  No hydrocephalus.  No intracranial mass lesion seen from the above described findings. Contrast was not administered.  Cerebellar tonsils minimally  low lying but within the range of normal  limits. Although there appears to be a slightly sagging appearance of the pons, no other findings of intracranial hypotension.  Orbital structures unremarkable.  Polypoid complex opacification inferior right maxillary sinus.  MRA HEAD FINDINGS  Anterior circulation without medium or large size vessel significant stenosis or occlusion.  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  Ectatic codominant vertebral arteries with slight narrowing but without high-grade stenosis.  Mild irregularity posterior inferior cerebellar arteries.  Mild ectasia mild narrowing basilar artery.  Nonvisualized anterior inferior cerebellar arteries.  Posterior cerebral artery branch vessel irregularity bilaterally.  No abnormal vessels seen surrounding the left occipital lobe acute hemorrhage.  IMPRESSION: MRI HEAD FINDINGS  Acute left occipital lobe 2.4 x 2 x 2.2 cm hematoma with surrounding vasogenic edema.  Remote insult with blood-stained encephalomalacia posterior inferior left temporal- occipital lobe.  Remote hemorrhage (noted as acute on 06/18/2004 CT) right parietal-occipital lobe and now with blood-stained encephalomalacia.  The cause of these areas of hemorrhage is indeterminate. In this hypertensive patient with significant peri vascular spaces and white matter type changes, findings may be related to hypertensive bleeds. Result of underlying vasculitis or amyloid angiopathy are possibilities. With involvement of the posterior circulation, posterior reversible encephalopathy syndrome with subsequent bleeds not entirely excluded although a secondary consideration.  The major dural sinuses appear patent and therefore hemorrhage from venous occlusion is a less likely consideration.  Cerebellar tonsils minimally low lying but within the range of normal limits. Although there appears to be a slightly sagging appearance of the pons, no other findings of intracranial hypotension.   Polypoid complex opacification inferior right maxillary sinus.  MRA HEAD FINDINGS  No abnormal vessels seen surrounding the left occipital lobe acute hemorrhage.  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  Anterior circulation without medium or large size vessel significant stenosis or occlusion.  Ectatic codominant vertebral arteries with slight narrowing but without high-grade stenosis.  Mild irregularity posterior inferior cerebellar arteries.  Mild ectasia mild narrowing basilar artery.  Nonvisualized anterior inferior cerebellar arteries.  Posterior cerebral artery branch vessel irregularity bilaterally.   Electronically Signed   By: Lacy Duverney M.D.   On: 11/11/2014 21:57   I have personally reviewed and evaluated these images and lab results as part of my medical decision-making.   EKG Interpretation   Date/Time:  Monday November 11 2014 15:46:22 EDT Ventricular Rate:  85 PR Interval:  153 QRS Duration: 80 QT Interval:  352 QTC Calculation: 418 R Axis:   47 Text Interpretation:  Sinus rhythm RAE, consider biatrial enlargement No  significant change since last tracing Confirmed by Rhunette Croft, MD, Janey Genta  [phone]) on 11/11/2014 4:30:26 PM      MDM   Final diagnoses:  Subarachnoid bleed     Patient is a 59 year old male with a history of hypertension that presents with left upper extremity numbness that started last night. Patient awoke this morning at 3 AM with a headache and had left eye blurriness difficulty with ambulation and slurred speech. Patient also had a headache at that time. Patient states that his headache is resolved his ataxia has symmetrically improved however he still has left hand numbness and left eye blurriness. On arrival to the ED the patient is hypertensive otherwise vitals are stable. In triage CT scan was performed which shows a left occipital intraparenchymal hemorrhage. On exam the patient has dysmetria of the left  hand, blurred vision of the left eye, decreased sensation in the left hand. Patient has normal strength in bilateral upper and lower extremity's. Patient is able to ambulate without any ataxia. Patient has a negative Romberg. We will send screening labs and consult neurosurgery. Neurosurgery was consult and evaluate the case and feel that they have no additional intervention at this time. Neurology was consult it and they will admit the patient to their service. Patient's blood pressure was controlled with boluses and did not require a Cardene drip.   Beverely Risen, MD 11/12/14 1610  Derwood Kaplan, MD 11/15/14 1900

## 2014-11-11 NOTE — ED Notes (Signed)
RES at bedside 

## 2014-11-11 NOTE — Consult Note (Addendum)
Referring Physician: ED    Chief Complaint: left sided numbness, blurred vision, dysarthria, left occipital hemorrhage on CT brain  HPI:                                                                                                                                         Luis Johnson is an 59 y.o. male with a past medical history significant for HTN, presents to the ED for evaluation of the above stated symptoms. Patient indicated that he went to bed last night " with a small HA" and when he woke up this morning his left arm was numb, speech was slurred, his vision was blurry, and the HA was still there. Denies associated vertigo, double vision, focal weakness, imbalance, or confusion. Further, no recent head trauma, no use of anticoagulants. CT brain was personally reviewed and showed acute hemorrhage involving the left parietal occipital lobe measuring 1.7 x 1.9 cm as well as generalized bilateral cerebral edema with low-density in the bilateral frontal/parietal and temporal white matter. No hydrocephalus or midline shifting. Serologies reviewed: platelets 253, PTT 2, INR 0.98, PTT 27 BP 155/94 mmHg in the ED  ICH volume 4.9cc ICH score: 0 Past Medical History  Diagnosis Date  . Hypertension     History reviewed. No pertinent past surgical history.  History reviewed. No pertinent family history. Social History:  reports that he quit smoking about 20 years ago. He does not have any smokeless tobacco history on file. He reports that he does not drink alcohol or use illicit drugs.  Family history: mother had brain aneurysm. No epilepsy, brain tumor, or MS Allergies:  Allergies  Allergen Reactions  . Eggs Or Egg-Derived Products Swelling    Medications:                                                                                                                           I have reviewed the patient's current medications.  ROS:  History obtained from chart review and the patient  General ROS: negative for - chills, fatigue, fever, night sweats, weight gain or weight loss Psychological ROS: negative for - behavioral disorder, hallucinations, memory difficulties, mood swings or suicidal ideation Ophthalmic ROS: negative for - blurry vision, double vision, eye pain or loss of vision ENT ROS: negative for - epistaxis, nasal discharge, oral lesions, sore throat, tinnitus or vertigo Allergy and Immunology ROS: negative for - hives or itchy/watery eyes Hematological and Lymphatic ROS: negative for - bleeding problems, bruising or swollen lymph nodes Endocrine ROS: negative for - galactorrhea, hair pattern changes, polydipsia/polyuria or temperature intolerance Respiratory ROS: negative for - cough, hemoptysis, shortness of breath or wheezing Cardiovascular ROS: negative for - chest pain, dyspnea on exertion, edema or irregular heartbeat Gastrointestinal ROS: negative for - abdominal pain, diarrhea, hematemesis, nausea/vomiting or stool incontinence Genito-Urinary ROS: negative for - dysuria, hematuria, incontinence or urinary frequency/urgency Musculoskeletal ROS: negative for - joint swelling or muscular weakness Neurological ROS: as noted in HPI Dermatological ROS: negative for rash and skin lesion changes  Physical exam:  Constitutional: well developed, pleasant male in no apparent distress. Blood pressure 147/86, pulse 73, temperature 98.1 F (36.7 C), temperature source Oral, resp. rate 15, SpO2 97 %. Eyes: no jaundice or exophthalmos.  Head: normocephalic. Neck: supple, no bruits, no JVD. Cardiac: no murmurs. Lungs: clear. Abdomen: soft, no tender, no mass. Extremities: no edema, clubbing, or cyanosis.  Skin: no rash  Neurologic Examination:                                                                                                       General: Mental Status: Alert, oriented, thought content appropriate.  Speech fluent without evidence of aphasia.  Able to follow 3 step commands without difficulty. Cranial Nerves: II: Discs flat bilaterally; Visual fields grossly normal, pupils equal, round, reactive to light and accommodation III,IV, VI: ptosis not present, extra-ocular motions intact bilaterally V,VII: smile symmetric, facial light touch sensation normal bilaterally VIII: hearing normal bilaterally IX,X: uvula rises symmetrically XI: bilateral shoulder shrug XII: midline tongue extension without atrophy or fasciculations  Motor: Right : Upper extremity   5/5    Left:     Upper extremity   5/5  Lower extremity   5/5     Lower extremity   5/5 Tone and bulk:normal tone throughout; no atrophy noted Sensory: Pinprick and light touch intact throughout, bilaterally Deep Tendon Reflexes:  Right: Upper Extremity   Left: Upper extremity   biceps (C-5 to C-6) 2/4   biceps (C-5 to C-6) 2/4 tricep (C7) 2/4    triceps (C7) 2/4 Brachioradialis (C6) 2/4  Brachioradialis (C6) 2/4  Lower Extremity Lower Extremity  quadriceps (L-2 to L-4) 2/4   quadriceps (L-2 to L-4) 2/4 Achilles (S1) 2/4   Achilles (S1) 2/4  Plantars: Right: downgoing   Left: downgoing Cerebellar: normal finger-to-nose,  normal heel-to-shin test Gait:  No tested due to' \multiple'  leads     Results for orders placed or performed during the hospital encounter of 11/11/14 (from the past 48 hour(s))  I-stat troponin, ED (not at [facility], [facility])     Status: None   Collection Time: 11/11/14  3:07 PM  Result Value Ref Range   Troponin i, poc 0.00 0.00 - 0.08 ng/mL   Comment 3            Comment: Due to the release kinetics of cTnI, a negative result within the first hours of the onset of symptoms does not rule out myocardial infarction with certainty. If myocardial infarction is still suspected, repeat the test at appropriate intervals.   Protime-INR      Status: None   Collection Time: 11/11/14  3:09 PM  Result Value Ref Range   Prothrombin Time 13.2 11.6 - 15.2 seconds   INR 0.98 0.00 - 1.49  APTT     Status: None   Collection Time: 11/11/14  3:09 PM  Result Value Ref Range   aPTT 27 24 - 37 seconds  CBC     Status: Abnormal   Collection Time: 11/11/14  3:09 PM  Result Value Ref Range   WBC 4.5 4.0 - 10.5 K/uL   RBC 5.93 (H) 4.22 - 5.81 MIL/uL   Hemoglobin 15.9 13.0 - 17.0 g/dL   HCT 48.5 39.0 - 52.0 %   MCV 81.8 78.0 - 100.0 fL   MCH 26.8 26.0 - 34.0 pg   MCHC 32.8 30.0 - 36.0 g/dL   RDW 14.8 11.5 - 15.5 %   Platelets 257 150 - 400 K/uL  Differential     Status: None   Collection Time: 11/11/14  3:09 PM  Result Value Ref Range   Neutrophils Relative % 59 43 - 77 %   Neutro Abs 2.6 1.7 - 7.7 K/uL   Lymphocytes Relative 32 12 - 46 %   Lymphs Abs 1.5 0.7 - 4.0 K/uL   Monocytes Relative 7 3 - 12 %   Monocytes Absolute 0.3 0.1 - 1.0 K/uL   Eosinophils Relative 2 0 - 5 %   Eosinophils Absolute 0.1 0.0 - 0.7 K/uL   Basophils Relative 0 0 - 1 %   Basophils Absolute 0.0 0.0 - 0.1 K/uL  Comprehensive metabolic panel     Status: Abnormal   Collection Time: 11/11/14  3:09 PM  Result Value Ref Range   Sodium 140 135 - 145 mmol/L   Potassium 4.7 3.5 - 5.1 mmol/L   Chloride 108 101 - 111 mmol/L   CO2 25 22 - 32 mmol/L   Glucose, Bld 96 65 - 99 mg/dL   BUN 12 6 - 20 mg/dL   Creatinine, Ser 0.98 0.61 - 1.24 mg/dL   Calcium 9.4 8.9 - 10.3 mg/dL   Total Protein 7.6 6.5 - 8.1 g/dL   Albumin 3.8 3.5 - 5.0 g/dL   AST 19 15 - 41 U/L   ALT 15 (L) 17 - 63 U/L   Alkaline Phosphatase 69 38 - 126 U/L   Total Bilirubin 0.7 0.3 - 1.2 mg/dL   GFR calc non Af Amer >60 >60 mL/min   GFR calc Af Amer >60 >60 mL/min    Comment: (NOTE) The eGFR has been calculated using the CKD EPI equation. This calculation has not been validated in all clinical situations. eGFR's persistently <60 mL/min signify possible Chronic Kidney Disease.    Anion  gap 7 5 - 15  I-Stat Chem 8, ED  (not at [facility], [facility])     Status: Abnormal   Collection Time: 11/11/14  3:09 PM  Result Value Ref Range  Sodium 141 135 - 145 mmol/L   Potassium 4.7 3.5 - 5.1 mmol/L   Chloride 105 101 - 111 mmol/L   BUN 19 6 - 20 mg/dL   Creatinine, Ser 1.00 0.61 - 1.24 mg/dL   Glucose, Bld 93 65 - 99 mg/dL   Calcium, Ion 1.19 1.13 - 1.30 mmol/L   TCO2 25 0 - 100 mmol/L   Hemoglobin 18.0 (H) 13.0 - 17.0 g/dL   HCT 53.0 (H) 39.0 - 52.0 %   Ct Head Wo Contrast  11/11/2014   CLINICAL DATA:  Loss of vision to both eyes and left arm numbness last night.  EXAM: CT HEAD WITHOUT CONTRAST  TECHNIQUE: Contiguous axial images were obtained from the base of the skull through the vertex without intravenous contrast.  COMPARISON:  December 19, 2006  FINDINGS: There is acute hemorrhage in the left parietal occipital lobe measuring 1.7 x 1.9 cm. There is generalized bilateral cerebral edema with low-density in the bilateral frontal/parietal and temporal white matter which should present on prior CT of 2008. Left basal ganglia calcification is unchanged compared to prior exam. There is no midline shift or hydrocephalus. The bony calvarium is intact. There is a right maxillary sinus retention cyst.  IMPRESSION: Acute hemorrhage involving the left parietal occipital lobe measuring 1.7 x 1.9 cm. Generalized changes of bilateral cerebrum as described. The findings raises the possibility of posterior reversible encephalopathy syndrome. Further evaluation with MRI is recommended.  These results were called by telephone at the time of interpretation on 11/11/2014 at 3:49 pm to Dr. Lajean Saver , who verbally acknowledged these results.   Electronically Signed   By: Abelardo Diesel M.D.   On: 11/11/2014 15:52     Assessment: 59 y.o. male presents to the ED with new onset left sided numbness, blurred vision, dysarthria, and CT brain with an acute hemorrhage involving the left parietal occipital lobe measuring 1.7  x 1.9 cm as well as generalized bilateral cerebral edema with low-density in the bilateral frontal/parietal and temporal white matter. No hydrocephalus or midline shifting. Findings raising concern for PRES. ? Hemorrhagic mass Ordered MRI/MRA brain. BP control. No antiplatelets or anticoagulants. Stroke team will follow up tomorrow.   Stroke Risk Factors - age, HTN      Dorian Pod, MD [facility] [phone]  11/11/2014, 5:56 PM

## 2014-11-11 NOTE — ED Notes (Signed)
Meal tray at bedside.  

## 2014-11-12 ENCOUNTER — Inpatient Hospital Stay (HOSPITAL_COMMUNITY): Payer: BLUE CROSS/BLUE SHIELD

## 2014-11-12 ENCOUNTER — Encounter (HOSPITAL_COMMUNITY): Payer: Self-pay | Admitting: *Deleted

## 2014-11-12 DIAGNOSIS — E785 Hyperlipidemia, unspecified: Secondary | ICD-10-CM

## 2014-11-12 DIAGNOSIS — I10 Essential (primary) hypertension: Secondary | ICD-10-CM

## 2014-11-12 DIAGNOSIS — I611 Nontraumatic intracerebral hemorrhage in hemisphere, cortical: Principal | ICD-10-CM

## 2014-11-12 DIAGNOSIS — I639 Cerebral infarction, unspecified: Secondary | ICD-10-CM

## 2014-11-12 LAB — BASIC METABOLIC PANEL
Anion gap: 7 (ref 5–15)
BUN: 11 mg/dL (ref 6–20)
CALCIUM: 9.2 mg/dL (ref 8.9–10.3)
CO2: 25 mmol/L (ref 22–32)
CREATININE: 0.87 mg/dL (ref 0.61–1.24)
Chloride: 106 mmol/L (ref 101–111)
GFR calc non Af Amer: >60 mL/min (ref >60)
Glucose, Bld: 90 mg/dL (ref 65–99)
Potassium: 4 mmol/L (ref 3.5–5.1)
SODIUM: 138 mmol/L (ref 135–145)

## 2014-11-12 LAB — CBC
HEMATOCRIT: 48.9 % (ref 39.0–52.0)
Hemoglobin: 16.3 g/dL (ref 13.0–17.0)
MCH: 26.8 pg (ref 26.0–34.0)
MCHC: 33.3 g/dL (ref 30.0–36.0)
MCV: 80.4 fL (ref 78.0–100.0)
Platelets: 238 10*3/uL (ref 150–400)
RBC: 6.08 MIL/uL — ABNORMAL HIGH (ref 4.22–5.81)
RDW: 14.9 % (ref 11.5–15.5)
WBC: 4.7 10*3/uL (ref 4.0–10.5)

## 2014-11-12 LAB — URINALYSIS, ROUTINE W REFLEX MICROSCOPIC
BILIRUBIN URINE: NEGATIVE
Glucose, UA: NEGATIVE mg/dL
HGB URINE DIPSTICK: NEGATIVE
Ketones, ur: NEGATIVE mg/dL
Leukocytes, UA: NEGATIVE
Nitrite: NEGATIVE
PROTEIN: NEGATIVE mg/dL
Specific Gravity, Urine: 1.013 (ref 1.005–1.030)
UROBILINOGEN UA: 0.2 mg/dL (ref 0.0–1.0)
pH: 7 (ref 5.0–8.0)

## 2014-11-12 LAB — RAPID URINE DRUG SCREEN, HOSP PERFORMED
AMPHETAMINES: NOT DETECTED
BARBITURATES: POSITIVE — AB
Benzodiazepines: NOT DETECTED
Cocaine: NOT DETECTED
Opiates: NOT DETECTED
TETRAHYDROCANNABINOL: NOT DETECTED

## 2014-11-12 LAB — LIPID PANEL
Cholesterol: 302 mg/dL — ABNORMAL HIGH (ref 0–200)
HDL: 41 mg/dL (ref >40)
LDL CALC: 231 mg/dL — AB (ref 0–99)
TRIGLYCERIDES: 151 mg/dL — AB (ref <150)
Total CHOL/HDL Ratio: 7.4 RATIO
VLDL: 30 mg/dL (ref 0–40)

## 2014-11-12 LAB — TSH: TSH: 1.4 u[IU]/mL (ref 0.350–4.500)

## 2014-11-12 LAB — MRSA PCR SCREENING: MRSA BY PCR: NEGATIVE

## 2014-11-12 LAB — T4, FREE: FREE T4: 0.85 ng/dL (ref 0.61–1.12)

## 2014-11-12 MED ORDER — ATORVASTATIN CALCIUM 80 MG PO TABS
80.0000 mg | ORAL_TABLET | Freq: Every day | ORAL | Status: DC
  Administered 2014-11-13: 80 mg via ORAL
  Filled 2014-11-12: qty 1

## 2014-11-12 MED ORDER — AMLODIPINE BESYLATE 10 MG PO TABS
10.0000 mg | ORAL_TABLET | Freq: Every day | ORAL | Status: DC
  Administered 2014-11-12 – 2014-11-13 (×2): 10 mg via ORAL
  Filled 2014-11-12 (×2): qty 1

## 2014-11-12 MED ORDER — ATORVASTATIN CALCIUM 40 MG PO TABS
40.0000 mg | ORAL_TABLET | Freq: Every day | ORAL | Status: DC
  Administered 2014-11-12: 40 mg via ORAL
  Filled 2014-11-12: qty 1

## 2014-11-12 MED ORDER — PANTOPRAZOLE SODIUM 40 MG PO TBEC
40.0000 mg | DELAYED_RELEASE_TABLET | Freq: Every day | ORAL | Status: DC
  Administered 2014-11-12 – 2014-11-13 (×2): 40 mg via ORAL
  Filled 2014-11-12 (×2): qty 1

## 2014-11-12 MED ORDER — LABETALOL HCL 5 MG/ML IV SOLN: 10.0000 mg | INTRAVENOUS | Status: DC | PRN

## 2014-11-12 NOTE — Progress Notes (Signed)
Patient arrived to 5MW01, alert, oriented, very jovial. VSS. Informed of being in video room. Informed of bed alarm rules and oriented to unit. Patient resting at this time. Says his vision is "much much better" and that he has "no more numbness or weakness anywhere".

## 2014-11-12 NOTE — Evaluation (Signed)
Physical Therapy Evaluation and D/C Patient Details Name: Luis Johnson MRN: [id number] DOB: Aug 04, 1955 Today's Date: 11/12/2014   History of Present Illness  Pt admitted with blurry vision, left sided numbness (upper not lower), and headache. MRI: Acute left occipital lobe 2.4 x 2 x 2.2 cm hematoma with surrounding vasogenic edema.Remote insult with blood-stained encephalomalacia posterior inferior left temporal- occipital lobe.Remote hemorrhage (noted as acute on 06/18/2004 CT) right parietal-occipital lobe and now with blood-stained encephalomalacia.  Clinical Impression  Pt admitted with above diagnosis. Pt currently without significant functional limitations ambulating without physical assist at Independent level.  Pt will need Outpt OT f/u to address visual deficits but no PT f/u warranted at this time.  Will sign off as pt has no PT needs.        Follow Up Recommendations No PT follow up;Supervision - Intermittent    Equipment Recommendations  None recommended by PT    Recommendations for Other Services       Precautions / Restrictions Precautions Precautions: Other (comment) (visual disturbance) Precaution Comments: Right superior vision deficits both eyes as well as some "double"/blurring vision to mid-far left and right fo midline Restrictions Weight Bearing Restrictions: No      Mobility  Bed Mobility Overal bed mobility: Independent                Transfers Overall transfer level: Independent                  Ambulation/Gait Ambulation/Gait assistance: Independent Ambulation Distance (Feet): 450 Feet Assistive device: None Gait Pattern/deviations: WFL(Within Functional Limits)   Gait velocity interpretation: Below normal speed for age/gender General Gait Details: No LOB with challenges and with head turns as well as with min perturbations to balance.  C/O  a little dizziness with head turns but did not lose balance.  Question if this was secondary to  visual issue - see OT note.    Stairs            Wheelchair Mobility    Modified Rankin (Stroke Patients Only)       Balance Overall balance assessment: Needs assistance;History of Falls Sitting-balance support: No upper extremity supported;Feet supported Sitting balance-Leahy Scale: Good     Standing balance support: No upper extremity supported;During functional activity Standing balance-Leahy Scale: Good               High level balance activites: Direction changes;Turns;Sudden stops;Head turns High Level Balance Comments: Pt can do all of above with independence without need for assistance.               Pertinent Vitals/Pain Pain Assessment: 0-10 Pain Score: 6  Pain Location: headache Pain Descriptors / Indicators: Aching Pain Intervention(s): Limited activity within patient's tolerance;Monitored during session;Premedicated before session;Repositioned  VSS    Home Living Family/patient expects to be discharged to:: Private residence Living Arrangements: Spouse/significant other Available Help at Discharge: Available PRN/intermittently Type of Home: House Home Access: Stairs to enter Entrance Stairs-Rails: Right;Left;Can reach both Entrance Stairs-Number of Steps: 2 Home Layout: Two level Home Equipment: Cane - single point;Walker - 2 wheels Additional Comments: 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    Prior Function Level of Independence: Independent               Hand Dominance   Dominant Hand: Right    Extremity/Trunk Assessment   Upper Extremity Assessment: Defer to OT evaluation       LUE Deficits / Details:  hard to look at [facility] due to where IV is placed and pt trying not to aggravate site; GM intact, decreased light touch compared to RUE   Lower Extremity Assessment: Generalized weakness      Cervical / Trunk Assessment: Normal  Communication   Communication: No difficulties  Cognition  Arousal/Alertness: Awake/alert Behavior During Therapy: WFL for tasks assessed/performed Overall Cognitive Status: Within Functional Limits for tasks assessed                      General Comments      Exercises        Assessment/Plan    PT Assessment Patent does not need any further PT services  PT Diagnosis Generalized weakness   PT Problem List Decreased mobility  PT Treatment Interventions Patient/family education;Gait training;Stair training;Balance training   PT Goals (Current goals can be found in the Care Plan section) Acute Rehab PT Goals Patient Stated Goal: to go home PT Goal Formulation: All assessment and education complete, DC therapy    Frequency     Barriers to discharge        Co-evaluation PT/OT/SLP Co-Evaluation/Treatment: Yes Reason for Co-Treatment: For patient/therapist safety PT goals addressed during session: Mobility/safety with mobility OT goals addressed during session: ADL's and self-care;Strengthening/ROM       End of Session Equipment Utilized During Treatment: Gait belt Activity Tolerance: Patient tolerated treatment well Patient left: in chair;with call bell/phone within reach Nurse Communication: Mobility status         Time: 1610-9604 PT Time Calculation (min) (ACUTE ONLY): 20 min   Charges:   PT Evaluation $Initial PT Evaluation Tier I: 1 Procedure     PT G CodesBerline Lopes November 20, 2014, 11:03 AM  [facility] [phone] (pager)

## 2014-11-12 NOTE — Progress Notes (Signed)
CM received consult:Patient needs PCP an opthamology follow up - how to arrange? Needs assistance with arranging. CM manager spoke with with pt regarding no PCP and provided pt with information on [facility] to establish PCP. CM scheduled f/u opthalmology appointment with [facility] for 11/15/14 @ 8:15 am, pt made aware and placed on AVS. Gae Gallop RN,BSN,CM [phone]

## 2014-11-12 NOTE — Progress Notes (Signed)
STROKE TEAM PROGRESS NOTE   SUBJECTIVE (INTERVAL HISTORY) His wife is at the bedside.  Overall he feels his condition is stable. 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 OT advised him, as well as Dr. Roda Shutters, that he cannot drive at this time.  OBJECTIVE Temp:  [97.8 F (36.6 C)-98.3 F (36.8 C)] 98.2 F (36.8 C) (08/30 1605) Pulse Rate:  [60-95] 73 (08/30 1605) Cardiac Rhythm:  [-] Normal sinus rhythm (08/30 1235) Resp:  [12-22] 22 (08/30 1605) BP: (120-161)/(76-116) 133/80 mmHg (08/30 1605) SpO2:  [96 %-100 %] 98 % (08/30 1605) Weight:  [159 lb 9.8 oz (72.4 kg)] 159 lb 9.8 oz (72.4 kg) (08/29 2210)  CBC:   Recent Labs Lab 11/11/14 1509 11/12/14 0925  WBC 4.5 4.7  NEUTROABS 2.6  --   HGB 15.9  18.0* 16.3  HCT 48.5  53.0* 48.9  MCV 81.8 80.4  PLT 257 238   Basic Metabolic Panel:   Recent Labs Lab 11/11/14 1509 11/12/14 0925  NA 140  141 138  K 4.7  4.7 4.0  CL 108  105 106  CO2 25 25  GLUCOSE 96  93 90  BUN CREATININE 0.98  1.00 0.87  CALCIUM 9.4 9.2   Lipid Panel:     Component Value Date/Time   CHOL 302* 11/12/2014 0925   TRIG 151* 11/12/2014 0925   HDL 41 11/12/2014 0925   CHOLHDL 7.4 11/12/2014 0925   VLDL 30 11/12/2014 0925   LDLCALC 231* 11/12/2014 0925   HgbA1c: No results found for: HGBA1C Urine Drug Screen:     Component Value Date/Time   LABOPIA NONE DETECTED 11/12/2014 1015   COCAINSCRNUR NONE DETECTED 11/12/2014 1015   LABBENZ NONE DETECTED 11/12/2014 1015   AMPHETMU NONE DETECTED 11/12/2014 1015   THCU NONE DETECTED 11/12/2014 1015   LABBARB POSITIVE* 11/12/2014 1015      IMAGING  Ct Head Wo Contrast 11/11/2014    Acute hemorrhage involving the left parietal occipital lobe measuring 1.7 x 1.9 cm. Generalized changes of bilateral cerebrum as described. The findings raises the possibility of posterior reversible encephalopathy syndrome.   MRI HEAD 11/11/2014   Acute left occipital lobe 2.4 x 2 x 2.2 cm  hematoma with surrounding vasogenic edema.  Remote insult with blood-stained encephalomalacia posterior inferior left temporal- occipital lobe.  Remote hemorrhage (noted as acute on 06/18/2004 CT) right parietal-occipital lobe and now with blood-stained encephalomalacia.  The cause of these areas of hemorrhage is indeterminate. In this hypertensive patient with significant peri vascular spaces and white matter type changes, findings may be related to hypertensive bleeds. Result of underlying vasculitis or amyloid angiopathy are possibilities. With involvement of the posterior circulation, posterior reversible encephalopathy syndrome with subsequent bleeds not entirely excluded although a secondary consideration.  The major dural sinuses appear patent and therefore hemorrhage from venous occlusion is a less likely consideration.  Cerebellar tonsils minimally low lying but within the range of normal limits. Although there appears to be a slightly sagging appearance of the pons, no other findings of intracranial hypotension.  Polypoid complex opacification inferior right maxillary sinus.    MRA HEAD 11/11/2014   No abnormal vessels seen surrounding the left occipital lobe acute hemorrhage.  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  Anterior circulation without medium or large size vessel significant stenosis or occlusion.  Ectatic codominant vertebral arteries with slight narrowing but without high-grade stenosis.  Mild irregularity  posterior inferior cerebellar arteries.  Mild ectasia mild narrowing basilar artery.  Nonvisualized anterior inferior cerebellar arteries.  Posterior cerebral artery branch vessel irregularity bilaterally.     CUS - Bilateral: 1-39% ICA stenosis. Vertebral artery flow is antegrade.  2D echo - pending  PHYSICAL EXAM  Temp:  [97.8 F (36.6 C)-98.3 F (36.8 C)] 98.2 F (36.8 C) (08/30 1605) Pulse Rate:  [60-95] 73 (08/30  1605) Resp:  [12-22] 22 (08/30 1605) BP: (120-161)/(76-116) 133/80 mmHg (08/30 1605) SpO2:  [96 %-100 %] 98 % (08/30 1605) Weight:  [159 lb 9.8 oz (72.4 kg)] 159 lb 9.8 oz (72.4 kg) (08/29 2210)  General - Well nourished, well developed, in no apparent distress.  Ophthalmologic - Fundi not visualized due to eye movement.  Cardiovascular - Regular rate and rhythm with no murmur.  Mental Status -  Level of arousal and orientation to time, place, and person were intact. Language including expression, naming, repetition, comprehension was assessed and found intact. Attention span and concentration were impaired, difficulty with backward spelling and calculation. Fund of Knowledge was assessed and was intact.  Cranial Nerves II - XII - II - right upper quadrant quadrantanopia. III, IV, VI - Extraocular movements intact. V - Facial sensation intact bilaterally. VII - right nasolabial fold mild flattening. VIII - Hearing & vestibular intact bilaterally. X - Palate elevates symmetrically. XI - Chin turning & shoulder shrug intact bilaterally. XII - Tongue protrusion intact.  Motor Strength - The patient's strength was normal in all extremities and pronator drift was absent.  Bulk was normal and fasciculations were absent.   Motor Tone - Muscle tone was assessed at the neck and appendages and was normal.  Reflexes - The patient's reflexes were 1+ in all extremities and he had no pathological reflexes.  Sensory - Light touch, temperature/pinprick, vibration and proprioception, and Romberg testing were assessed and were symmetrical.    Coordination - The patient had normal movements in the hands and feet with no ataxia or dysmetria.  Tremor was absent.  Gait and Station - The patient's transfers, posture, gait, station, and turns were observed as normal  ASSESSMENT/PLAN Luis Johnson is a 58 y.o. male with history of HTN presenting with left sided numbness, blurred vision, dysarthria &  HA. CT head showed left occipital lobe ICH. MRI showed left occipital acute hemorrhage and right occipital chronic hemorrhage.  He did not receive IV t-PA due to hemorrhage.   ICH:  left occipital hemorrhage secondary to HTN (on home amlodipine) vs CAA (given BP not that high, previous right occipital ICH and family history of Alzheimer's disease with ICH).  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   no antithrombotic prior to admission, no antithrombotic now due to ICH.  Ongoing aggressive stroke risk factor management  Patient has been advised not to drive  Therapy recommendations:  pending. Ok to be OOB  Disposition:  pending   Hypertension  BP 155/94 on arrival  Home meds:  norvasc 10  SBP goal < 160 listed  Stable this am  Resume home med  Hyperlipidemia  Home meds:  lipitor 40, not yet resumed in hospital  LDL 231   Increase Lipitor to 80  Continue statin at discharge  ? CAA  Acute left occipital lobe ICH  Chronic right occipital lobe ICH  Blood pressure not very high at baseline  Mom had Alzheimer's disease and died of ICH  Other Stroke Risk Factors  Former Cigarette smoker, quit smoking 20 years ago  Hospital day # 1  Marvel Plan, MD PhD Stroke Neurology 11/12/2014 4:26 PM    To contact Stroke Continuity provider, please refer to [email or web address]. After hours, contact General Neurology

## 2014-11-12 NOTE — Care Management Note (Addendum)
Case Management Note  Patient Details  Name: Linville Decarolis MRN: [id number] Date of Birth: [birth year]  Subjective/Objective:               Admitted form home with  left sided numbness, blurred vision, dysarthria, left occipital hemorrhage on MRI(HEAD) noted.   Action/Plan: Discharge Planning  Expected Discharge Date:    unknown          Expected Discharge Plan:  Home/Self Care  In-House Referral:     Discharge planning Services  CM Consult  Post Acute Care Choice:    Choice offered to:     DME Arranged:    DME Agency:     HH Arranged:    HH Agency:     Status of Service:  In process, will continue to follow  Medicare Important Message Given:    Date Medicare IM Given:    Medicare IM give by:    Date Additional Medicare IM Given:    Additional Medicare Important Message give by:     If discussed at Long Length of Stay Meetings, dates discussed:    Additional Comments: Quanell Loughney (Spouse) [phone]  Gae Gallop [place], [place] [phone] 11/12/2014, 12:47 PM

## 2014-11-12 NOTE — Progress Notes (Signed)
VASCULAR LAB PRELIMINARY  PRELIMINARY  PRELIMINARY  PRELIMINARY  Carotid duplex completed.    Preliminary report:  Bilateral:  1-39% ICA stenosis lowest end of scale.  Vertebral artery flow is antegrade.     Maleny Candy, RVS 11/12/2014, 1:04 PM

## 2014-11-12 NOTE — Evaluation (Signed)
Occupational Therapy Evaluation Patient Details Name: Key Cen MRN: [id number] DOB: 1956/02/03 Today's Date: 11/12/2014    History of Present Illness Pt admitted with blurry vision, left sided numbness (upper not lower), and headache. MRI: Acute left occipital lobe 2.4 x 2 x 2.2 cm hematoma with surrounding vasogenic edema.Remote insult with blood-stained encephalomalacia posterior inferior left temporal- occipital lobe.Remote hemorrhage (noted as acute on 06/18/2004 CT) right parietal-occipital lobe and now with blood-stained encephalomalacia.   Clinical Impression   This 59 yo male admitted with above presents to acute OT with decreased use of LUE (mainly due to IV site, but with some sensory issues), vision changes both affecting his safety and independence with BADLs, IADLs, and driving. He will benefit from acute OT with follow up OT at OP for vision.     Follow Up Recommendations  Outpatient OT    Equipment Recommendations  None recommended by OT       Precautions / Restrictions Precautions Precautions: Other (comment) (visual disturbance) Precaution Comments: Right superior vision deficits both eyes as well as some "double"/blurring vision to mid-far left and right fo midline Restrictions Weight Bearing Restrictions: No      Mobility Bed Mobility Overal bed mobility: Independent                Transfers Overall transfer level: Independent                    Balance Overall balance assessment: Needs assistance;History of Falls Sitting-balance support: No upper extremity supported;Feet supported Sitting balance-Leahy Scale: Good     Standing balance support: No upper extremity supported;During functional activity Standing balance-Leahy Scale: Good               High level balance activites: Direction changes;Turns;Sudden stops;Head turns High Level Balance Comments: Pt can do all of above with independence without need for assistance.               ADL Overall ADL's : Needs assistance/impaired Eating/Feeding: Set up Eating/Feeding Details (indicate cue type and reason): due to left IV site Grooming: Moderate assistance;Standing Grooming Details (indicate cue type and reason): due to left IV site Upper Body Bathing: Minimal assitance Upper Body Bathing Details (indicate cue type and reason): due to left IV site Lower Body Bathing: Minimal assistance Lower Body Bathing Details (indicate cue type and reason): due to left IV site, independent sit<>stand Upper Body Dressing : Minimal assistance;Sitting Upper Body Dressing Details (indicate cue type and reason): due to left IV site Lower Body Dressing: Moderate assistance Lower Body Dressing Details (indicate cue type and reason): due to left IV site, independent sit<>stand Toilet Transfer: Supervision/safety;Regular Social worker and Hygiene: Minimal assistance Toileting - Clothing Manipulation Details (indicate cue type and reason): due to left IV site             Vision Vision Assessment?: Yes Eye Alignment: Impaired (comment) (pt reporting blurry/overlapping vision) Ocular Range of Motion: Within Functional Limits Alignment/Gaze Preference: Within Defined Limits Tracking/Visual Pursuits: Able to track stimulus in all quads without difficulty Saccades: Decreased speed of saccadic movement;Undershoots;Overshoots;Additional eye shifts occurred during testing Convergence: Within functional limits Visual Fields: Right superior homonymous quadranopsia Additional Comments: wears bifocal, advised him not to drive until cleared to do so by MD          Pertinent Vitals/Pain Pain Assessment: 0-10 Pain Score: 6  Pain Location: headache Pain Descriptors / Indicators: Aching Pain Intervention(s): Limited activity within patient's tolerance;Monitored during session;Premedicated before  session;Repositioned     Hand Dominance Right    Extremity/Trunk Assessment Upper Extremity Assessment Upper Extremity Assessment: Defer to OT evaluation LUE Deficits / Details: hard to look at [facility] due to where IV is placed and pt trying not to aggravate site; GM intact, decreased light touch compared to RUE   Lower Extremity Assessment Lower Extremity Assessment: Generalized weakness   Cervical / Trunk Assessment Cervical / Trunk Assessment: Normal   Communication Communication Communication: No difficulties   Cognition Arousal/Alertness: Awake/alert Behavior During Therapy: WFL for tasks assessed/performed Overall Cognitive Status: Within Functional Limits for tasks assessed                                Home Living Family/patient expects to be discharged to:: Private residence Living Arrangements: Spouse/significant other Available Help at Discharge: Available PRN/intermittently Type of Home: House Home Access: Stairs to enter [employer] of Steps: 2 Entrance Stairs-Rails: Right;Left;Can reach both Home Layout: Two level Alternate Level Stairs-Number of Steps: 18 Alternate Level Stairs-Rails: Left Bathroom Shower/Tub: Producer, television/film/video: Standard Bathroom Accessibility: Yes   Home Equipment: Cane - single point;Walker - 2 wheels   Additional Comments: 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      Prior Functioning/Environment Level of Independence: Independent             OT Diagnosis: Generalized weakness;Disturbance of vision   OT Problem List: Decreased strength;Impaired vision/perception   OT Treatment/Interventions: Visual/perceptual remediation/compensation;Patient/family education;Therapeutic exercise;Therapeutic activities    OT Goals(Current goals can be found in the care plan section) Acute Rehab OT Goals Patient Stated Goal: to go home and back to work as soon as possible OT Goal Formulation: With patient Time For Goal Achievement:  11/19/14 Potential to Achieve Goals: Good  OT Frequency: Min 3X/week           Co-evaluation PT/OT/SLP Co-Evaluation/Treatment: Yes (partial) Reason for Co-Treatment: For patient/therapist safety PT goals addressed during session: Mobility/safety with mobility OT goals addressed during session: ADL's and self-care;Strengthening/ROM      End of Session Nurse Communication: Mobility status  Activity Tolerance: Patient tolerated treatment well Patient left: in chair;with call bell/phone within reach   Time: 0831-0900 OT Time Calculation (min): 29 min Charges:  OT General Charges $OT Visit: 1 Procedure OT Evaluation $Initial OT Evaluation Tier I: 1 Procedure  Evette Georges [phone] 11/12/2014, 11:12 AM

## 2014-11-13 ENCOUNTER — Inpatient Hospital Stay (INDEPENDENT_AMBULATORY_CARE_PROVIDER_SITE_OTHER): Payer: BLUE CROSS/BLUE SHIELD

## 2014-11-13 DIAGNOSIS — I6789 Other cerebrovascular disease: Secondary | ICD-10-CM

## 2014-11-13 LAB — CBC
HEMATOCRIT: 45.9 % (ref 39.0–52.0)
HEMOGLOBIN: 14.8 g/dL (ref 13.0–17.0)
MCH: 26.2 pg (ref 26.0–34.0)
MCHC: 32.2 g/dL (ref 30.0–36.0)
MCV: 81.4 fL (ref 78.0–100.0)
Platelets: 268 10*3/uL (ref 150–400)
RBC: 5.64 MIL/uL (ref 4.22–5.81)
RDW: 14.8 % (ref 11.5–15.5)
WBC: 4.4 10*3/uL (ref 4.0–10.5)

## 2014-11-13 LAB — BASIC METABOLIC PANEL WITH GFR
Anion gap: 7 (ref 5–15)
BUN: 18 mg/dL (ref 6–20)
CO2: 25 mmol/L (ref 22–32)
Calcium: 8.9 mg/dL (ref 8.9–10.3)
Chloride: 105 mmol/L (ref 101–111)
Creatinine, Ser: 0.93 mg/dL (ref 0.61–1.24)
GFR calc Af Amer: >60 mL/min
GFR calc non Af Amer: >60 mL/min
Glucose, Bld: 92 mg/dL (ref 65–99)
Potassium: 4 mmol/L (ref 3.5–5.1)
Sodium: 137 mmol/L (ref 135–145)

## 2014-11-13 LAB — HEMOGLOBIN A1C
Hgb A1c MFr Bld: 6.1 % — ABNORMAL HIGH (ref 4.8–5.6)
MEAN PLASMA GLUCOSE: 128 mg/dL

## 2014-11-13 MED ORDER — ATORVASTATIN CALCIUM 80 MG PO TABS: 80.0000 mg | ORAL_TABLET | Freq: Every day | ORAL | Status: DC

## 2014-11-13 NOTE — Progress Notes (Signed)
  Echocardiogram 2D Echocardiogram has been performed.  Luis Johnson 11/13/2014, 8:59 AM

## 2014-11-13 NOTE — Progress Notes (Signed)
Occupational Therapy Treatment Patient Details Name: Luis Johnson MRN: [id number] DOB: October 01, 1955 Today's Date: 11/13/2014    History of present illness Pt admitted with blurry vision, left sided numbness (upper not lower), and headache. MRI: Acute left occipital lobe 2.4 x 2 x 2.2 cm hematoma with surrounding vasogenic edema.Remote insult with blood-stained encephalomalacia posterior inferior left temporal- occipital lobe.Remote hemorrhage (noted as acute on 06/18/2004 CT) right parietal-occipital lobe and now with blood-stained encephalomalacia.   OT comments  Pt progressing towards acute OT goals. Pt reports his vision has improved. Focus of session was compensatory strategies and exercises for visual field loss and diplopia. Session details below. OT to continue to follow acutely.  Follow Up Recommendations  Outpatient OT    Equipment Recommendations  None recommended by OT    Recommendations for Other Services      Precautions / Restrictions Precautions Precautions: Other (comment) Precaution Comments: right superior vision deficits, now reports intermittent blurriness/diplopia Restrictions Weight Bearing Restrictions: No       Mobility Bed Mobility               General bed mobility comments: sitting EOB  Transfers Overall transfer level: Independent Equipment used: None                  Balance Overall balance assessment: Needs assistance;History of Falls         Standing balance support: No upper extremity supported;During functional activity Standing balance-Leahy Scale: Good                     ADL Overall ADL's : Needs assistance/impaired                                     Functional mobility during ADLs: Modified independent General ADL Comments: Pt playing checkers EOB upon therapist arrival. Pt ambulated household distance including head turns, sudden stops, and turning with no LOB. Pt reports blurry/double vision  is now only intermittent. Educated on compensatory strageies for visual field loss as well as importance of f/u with opthamologist and hodling off on driving until cleared by MD. Rosine Abe on diplopia exercise. Pt reports sensation in LUE is at baseline.       Vision                 Additional Comments: see ADL comments. Pt reports vision is much improved.    Perception     Praxis      Cognition   Behavior During Therapy: WFL for tasks assessed/performed Overall Cognitive Status: Within Functional Limits for tasks assessed                       Extremity/Trunk Assessment               Exercises     Shoulder Instructions       General Comments      Pertinent Vitals/ Pain       Pain Assessment: 0-10 Pain Score: 1  Pain Location: headache Pain Descriptors / Indicators: Tingling Pain Intervention(s): Premedicated before session;Monitored during session  Home Living                                          Prior Functioning/Environment  Frequency Min 3X/week     Progress Toward Goals  OT Goals(current goals can now be found in the care plan section)  Progress towards OT goals: Progressing toward goals  Acute Rehab OT Goals Patient Stated Goal: to go home and back to work as soon as possible OT Goal Formulation: With patient Time For Goal Achievement: 11/19/14 Potential to Achieve Goals: Good ADL Goals Pt Will Perform Grooming: with modified independence;standing Pt/caregiver will Perform Home Exercise Program:  ((therapeutic activties for using LUE)) Additional ADL Goal #1: Pt will be Mod I to use strategies for RUQ homonymous hemianopsia and "double"/overlapping/blurry vision  Plan Discharge plan remains appropriate    Co-evaluation                 End of Session     Activity Tolerance Patient tolerated treatment well   Patient Left in bed;with call bell/phone within reach;with family/visitor  present;Other (comment) (EOB)   Nurse Communication          Time: 1610-9604 OT Time Calculation (min): 13 min  Charges: OT General Charges $OT Visit: 1 Procedure OT Treatments $Self Care/Home Management : 8-22 mins  Pilar Grammes 11/13/2014, 10:42 AM

## 2014-11-13 NOTE — Evaluation (Signed)
Speech Language Pathology Evaluation Patient Details Name: Luis Johnson MRN: [id number] DOB: February 03, 1956 Today's Date: 11/13/2014 Time: 4098-1191 SLP Time Calculation (min) (ACUTE ONLY): 28 min  Problem List:  Patient Active Problem List   Diagnosis Date Noted  . ICH (intracerebral hemorrhage) 11/11/2014   Past Medical History:  Past Medical History  Diagnosis Date  . Hypertension    Past Surgical History: History reviewed. No pertinent past surgical history. HPI:  Pt admitted with blurry vision, left sided numbness (upper not lower), and headache. MRI: Acute left occipital lobe 2.4 x 2 x 2.2 cm hematoma with surrounding vasogenic edema.Remote insult with blood-stained encephalomalacia posterior inferior left temporal- occipital lobe.Remote hemorrhage (noted as acute on 06/18/2004 CT) right parietal-occipital lobe and now with blood-stained encephalomalacia.   Assessment / Plan / Recommendation Clinical Impression  Pt demonstrates difficulty with retrieval of new information, but describes this as a baseline deficit. He verbalizes how he managed this PTA and shows good anticipatory awareness about how to proceed at home post-discharge. Given that pt seems to be at his cognitive-linguistic baseline, SLP to sign off acutely.    SLP Assessment  Patient does not need any further Speech Lanaguage Pathology Services    Follow Up Recommendations  None;24 hour supervision/assistance;Other (comment) (supervision initially upon return home)    Pertinent Vitals/Pain Pain Assessment: No/denies pain   SLP Goals  Patient/Family Stated Goal: none stated  SLP Evaluation Prior Functioning  Cognitive/Linguistic Baseline: Baseline deficits Baseline deficit details: pt describes baseline memory deficits Type of Home: House  Lives With: Spouse Available Help at Discharge: Family Vocation: Full time employment   Cognition  Overall Cognitive Status: History of cognitive impairments - at  baseline    Comprehension  Auditory Comprehension Overall Auditory Comprehension: Appears within functional limits for tasks assessed    Expression Expression Primary Mode of Expression: Verbal Verbal Expression Overall Verbal Expression: Appears within functional limits for tasks assessed   Oral / Motor Motor Speech Overall Motor Speech: Appears within functional limits for tasks assessed       Maxcine Ham, M.A. CCC-SLP [phone]  Maxcine Ham 11/13/2014, 2:31 PM

## 2014-11-13 NOTE — H&P (Signed)
I performed a history and physical examination of  Luis Johnson and discussed his management with the resident physician. I agree with the history, physical, assessment, and plan of care, with the following exceptions: None I was present for the following procedures: None  Time Spent in Critical Care of the patient: None  Time spent in discussions with the patient and family: 10 min  [place]    CRITICAL CARE Performed by: Derwood Kaplan   Total critical care time: 33 min  Critical care time was exclusive of separately billable procedures and treating other patients.  Critical care was necessary to treat or prevent imminent or life-threatening deterioration.  Critical care was time spent personally by me on the following activities: development of treatment plan with patient and/or surrogate as well as nursing, discussions with consultants, evaluation of patient's response to treatment, examination of patient, obtaining history from patient or surrogate, ordering and performing treatments and interventions, ordering and review of laboratory studies, ordering and review of radiographic studies, pulse oximetry and re-evaluation of patient's condition.    ICD-9-CM ICD-10-CM   1. Subarachnoid bleed 430 I60.9   2. Stroke with cerebral ischemia 434.91 I63.9   3. Stroke 434.91 I63.9 VAS US CAROTID     VAS US CAROTID  4. Cerebral hemorrhage 431 I61.9 CT Head Wo Contrast     CT Head Wo Contrast  5. Nontraumatic cortical hemorrhage of cerebral hemisphere 431 I61.1 Ambulatory referral to Neurology    Pt with SAH. He has no objective neuro deficits currently and GCS is 15. Mild headache during my exam. Will admit. Neuro consulted.

## 2014-11-13 NOTE — Discharge Summary (Signed)
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 Hypertension    History reviewed. No pertinent past surgical history.    Medication List    TAKE these medications        acetaminophen 325 MG tablet  Commonly known as:  TYLENOL  Take 650 mg by mouth every 6 (six) hours as needed for mild pain.     albuterol-ipratropium 18-103 MCG/ACT inhaler  Commonly known as:  COMBIVENT  Inhale 1 puff into the lungs every 4 (four) hours as needed for wheezing or shortness of breath.     amLODipine 10 MG tablet  Commonly known as:  NORVASC  Take 10 mg by mouth daily.     atorvastatin 80 MG tablet  Commonly known as:  LIPITOR  Take 1 tablet (80 mg total) by mouth daily.        LABORATORY STUDIES CBC    Component Value Date/Time   WBC 4.4 11/13/2014 0607   RBC 5.64 11/13/2014 0607   HGB 14.8 11/13/2014 0607   HCT 45.9 11/13/2014 0607   PLT 268 11/13/2014 0607   MCV 81.4 11/13/2014 0607   MCH 26.2 11/13/2014 0607   MCHC 32.2 11/13/2014 0607   RDW 14.8 11/13/2014 0607   LYMPHSABS 1.5 11/11/2014 1509   MONOABS 0.3 11/11/2014 1509   EOSABS 0.1 11/11/2014 1509   BASOSABS 0.0 11/11/2014 1509   CMP    Component Value Date/Time   NA 137 11/13/2014 0607   K 4.0 11/13/2014 0607   CL 105 11/13/2014 0607   CO2 25 11/13/2014 0607   GLUCOSE 92 11/13/2014 0607   BUN 18 11/13/2014 0607   CREATININE 0.93 11/13/2014 0607   CALCIUM 8.9 11/13/2014 0607   PROT 7.6 11/11/2014 1509   ALBUMIN 3.8 11/11/2014 1509   AST 19 11/11/2014 1509    ALT 15* 11/11/2014 1509   ALKPHOS 69 11/11/2014 1509   BILITOT 0.7 11/11/2014 1509   GFRNONAA >60 11/13/2014 0607   GFRAA >60 11/13/2014 0607   COAGS Lab Results  Component Value Date   INR 0.98 11/11/2014   Lipid Panel    Component Value Date/Time   CHOL 302* 11/12/2014 0925   TRIG 151* 11/12/2014 0925   HDL 41 11/12/2014 0925   CHOLHDL 7.4 11/12/2014 0925   VLDL 30 11/12/2014 0925   LDLCALC 231* 11/12/2014 0925   HgbA1C  Lab Results  Component Value Date   HGBA1C 6.1* 11/12/2014   Urinalysis    Component Value Date/Time   COLORURINE YELLOW 11/12/2014 1015   APPEARANCEUR CLEAR 11/12/2014 1015   LABSPEC 1.013 11/12/2014 1015   PHURINE 7.0 11/12/2014 1015   GLUCOSEU NEGATIVE 11/12/2014 1015   HGBUR NEGATIVE 11/12/2014 1015   BILIRUBINUR NEGATIVE 11/12/2014 1015   KETONESUR NEGATIVE 11/12/2014 1015   PROTEINUR NEGATIVE 11/12/2014 1015   UROBILINOGEN 0.2 11/12/2014 1015   NITRITE NEGATIVE 11/12/2014 1015   LEUKOCYTESUR NEGATIVE 11/12/2014 1015   Urine Drug Screen     Component Value Date/Time   LABOPIA NONE DETECTED 11/12/2014 1015   COCAINSCRNUR NONE DETECTED  11/12/2014 1015   LABBENZ NONE DETECTED 11/12/2014 1015   AMPHETMU NONE DETECTED 11/12/2014 1015   THCU NONE DETECTED 11/12/2014 1015   LABBARB POSITIVE* 11/12/2014 1015     SIGNIFICANT DIAGNOSTIC STUDIES Ct Head Wo Contrast 11/11/2014 Acute hemorrhage involving the left parietal occipital lobe measuring 1.7 x 1.9 cm. Generalized changes of bilateral cerebrum as described. The findings raises the possibility of posterior reversible encephalopathy syndrome.   MRI HEAD 11/11/2014 Acute left occipital lobe 2.4 x 2 x 2.2 cm hematoma with surrounding vasogenic edema. Remote insult with blood-stained encephalomalacia posterior inferior left temporal- occipital lobe. Remote hemorrhage (noted as acute on 06/18/2004 CT) right parietal-occipital lobe and now with blood-stained encephalomalacia. The cause of  these areas of hemorrhage is indeterminate. In this hypertensive patient with significant peri vascular spaces and white matter type changes, findings may be related to hypertensive bleeds. Result of underlying vasculitis or amyloid angiopathy are possibilities. With involvement of the posterior circulation, posterior reversible encephalopathy syndrome with subsequent bleeds not entirely excluded although a secondary consideration. The major dural sinuses appear patent and therefore hemorrhage from venous occlusion is a less likely consideration. Cerebellar tonsils minimally low lying but within the range of normal limits. Although there appears to be a slightly sagging appearance of the pons, no other findings of intracranial hypotension. Polypoid complex opacification inferior right maxillary sinus.   MRA HEAD 11/11/2014 No abnormal vessels seen surrounding the left occipital lobe acute hemorrhage. 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 Anterior circulation without medium or large size vessel significant stenosis or occlusion. Ectatic codominant vertebral arteries with slight narrowing but without high-grade stenosis. Mild irregularity posterior inferior cerebellar arteries. Mild ectasia mild narrowing basilar artery. Nonvisualized anterior inferior cerebellar arteries. Posterior cerebral artery branch vessel irregularity bilaterally.   Carotid Ultrasound - Bilateral: 1-39% ICA stenosis. Vertebral artery flow is antegrade.  2D echo - Left ventricle: The cavity size was normal. Wall thickness wasincreased in a pattern of mild LVH. There was focal basalhypertrophy. Systolic function was normal. The estimated ejectionfraction was in the range of 60% to 65%. Wall motion was normal;there were no regional wall motion abnormalities. Dopplerparameters are consistent with abnormal left ventricularrelaxation (grade 1 diastolic dysfunction).      HISTORY OF PRESENT ILLNESS Luis Johnson is an 59 y.o. male with a past medical history significant for HTN, presents to the ED 11/11/2014 for evaluation of left sided numbness, blurred vision, dysarthria. Patient indicated that he went to bed last night 11/10/2014 "with a small HA" and when he woke up this morning his left arm was numb, speech was slurred, his vision was blurry, and the HA was still there. Denies associated vertigo, double vision, focal weakness, imbalance, or confusion. Further, no recent head trauma, no use of anticoagulants. CT brain showed acute hemorrhage involving the left parietal occipital lobe measuring 1.7 x 1.9 cm as well as generalized bilateral cerebral edema with low-density in the bilateral frontal/parietal and temporal white matter. No hydrocephalus or midline shift. Serologies reviewed: platelets 253, PTT 2, INR 0.98, PTT 27. BP was 155/94 mmHg in the ED.  ICH score: 0    HOSPITAL COURSE [name] is a 59 y.o. male with history of HTN presenting with left sided numbness, blurred vision, dysarthria & HA. CT head showed left occipital lobe ICH. MRI showed left occipital acute hemorrhage and right occipital chronic hemorrhage.  ICH: left occipital hemorrhage secondary to HTN (on home amlodipine) vs CAA (given BP not that  high, previous right occipital ICH and family history of Alzheimer's disease with ICH).  Resultant RUQ field cut, R NL fold flattening  MRI L occipital hemorrhage, and chronic right occipital ICH  MRA no large vessel cut off, R MCA bifurcation 3.6 mm aneurysm  Repeat CT stable hematoma  Carotid Doppler Unremarkable  2D Echo No source of embolus   LDL 231  HgbA1c 6.1, WNL  On no antithrombotic prior to admission  Patient has been advised not to drive  Therapy recommendations: OP OT, no PT  Disposition: home with OP therapies  Hypertension  BP 155/94 on arrival  Home meds: norvasc 10  SBP goal < 160 in  hospital  Stable   Resumed home medication  Hyperlipidemia  Home meds: lipitor 40  LDL 231  Increased Lipitor to 80  Continue statin at discharge  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 rate 20, height 5\' 8"  (1.727 m), weight 72.4 kg (159 lb 9.8 oz), SpO2 98 %. General - Well nourished, well developed, in no apparent distress.  Ophthalmologic - Fundi not visualized due to eye movement.  Cardiovascular - Regular rate and rhythm with no murmur.  Mental Status -  Level of arousal and orientation to time, place, and person were intact. Language including expression, naming, repetition, comprehension was assessed and found intact. Attention span and concentration were impaired, difficulty with backward spelling and calculation. Fund of Knowledge was assessed and was intact.  Cranial Nerves II - XII - II - right upper quadrant quadrantanopia. III, IV, VI - Extraocular movements intact. V - Facial sensation intact bilaterally. VII - right nasolabial fold mild flattening. VIII - Hearing & vestibular intact bilaterally. X - Palate elevates symmetrically. XI - Chin turning & shoulder shrug intact bilaterally. XII - Tongue protrusion intact.  Motor Strength - The patient's strength was normal in all extremities and pronator drift was absent. Bulk was normal and fasciculations were absent.  Motor Tone - Muscle tone was assessed at the neck and appendages and was normal.  Reflexes - The patient's reflexes were 1+ in all extremities and he had no pathological reflexes.  Sensory - Light touch, temperature/pinprick, vibration and  proprioception, and Romberg testing were assessed and were symmetrical.   Coordination - The patient had normal movements in the hands and feet with no ataxia or dysmetria. Tremor was absent.  Gait and Station - The patient's transfers, posture, gait, station, and turns were observed as normal   Discharge Diet   Diet Heart Room service appropriate?: Yes; Fluid consistency:: Thin liquids  DISCHARGE PLAN  Disposition:  Home with family  OP OT  Due to hemorrhage and risk of bleeding, do not take aspirin, aspirin-containing medications, or ibuprofen products   Follow up opthamology Friday morning 11/15/2014 at 0800  Follow-up primary care MD in 2 weeks. Get one if you do not have.  Follow-up with Dr. Marvel Plan, Stroke Clinic in 1 month.  35 minutes were spent preparing discharge.  Rhoderick Moody [facility] See Amion for Pager information 11/13/2014 1:48 PM    I, the attending vascular neurologist, have personally obtained a history, examined the patient, evaluated laboratory data,  individually viewed imaging studies and agree with radiology interpretations. Together with the NP/PA, we formulated the assessment and plan of care which reflects our mutual decision.  I have made any additions or clarifications directly to the above note and agree with the findings and plan as currently documented.    Marvel Plan, MD PhD Stroke Neurology 11/13/2014 5:39 PM

## 2014-11-27 ENCOUNTER — Ambulatory Visit (INDEPENDENT_AMBULATORY_CARE_PROVIDER_SITE_OTHER): Payer: BLUE CROSS/BLUE SHIELD | Admitting: Neurology

## 2014-11-27 ENCOUNTER — Encounter: Payer: Self-pay | Admitting: Neurology

## 2014-11-27 VITALS — BP 129/77 | HR 92 | Ht 68.0 in | Wt 168.0 lb

## 2014-11-27 DIAGNOSIS — H53461 Homonymous bilateral field defects, right side: Secondary | ICD-10-CM

## 2014-11-27 DIAGNOSIS — I680 Cerebral amyloid angiopathy: Secondary | ICD-10-CM

## 2014-11-27 DIAGNOSIS — E854 Organ-limited amyloidosis: Secondary | ICD-10-CM

## 2014-11-27 NOTE — Patient Instructions (Addendum)
I had a long d/w patient and wife about his recent brain hemorrhage, discussed possible etiology including amyloid angiopathy versus hypertensive risk for recurrent stroke/TIAs, personally independently reviewed imaging studies and stroke evaluation results and answered questions.Avoid antiplatelets agents for secondary stroke prevention given history of brain hemorrhage and maintain strict control of hypertension with blood pressure goal below 130/90, diabetes with hemoglobin A1c goal below 6.5% and lipids with LDL cholesterol goal below 100 mg/dL. I also advised the patient to eat a healthy diet with plenty of whole grains, cereals, fruits and vegetables, exercise regularly and maintain ideal body weight he was advised not to drive his car as well as tractor trailer till his peripheral vision improves. I will refer him to ophthalmologist for computerized visual field testing in detail eye exam for clearance for driving. Short-term disability paperwork for the patient was completed during this visit. Followup in the future with me in 3 months or call earlier if necessary.

## 2014-11-28 ENCOUNTER — Telehealth: Payer: Self-pay | Admitting: *Deleted

## 2014-11-28 DIAGNOSIS — Z0289 Encounter for other administrative examinations: Secondary | ICD-10-CM

## 2014-11-28 NOTE — Progress Notes (Signed)
[facility] [address] [place]. [place] [address] [phone]       OFFICE FOLLOW-UP NOTE  Luis. Luis Johnson Date of Birth:  Sep 21, 1955 Medical Record Number:  [id number]   HPI: Luis Johnson is a 59 year male seen today for the first office follow-up visit for in-hospital admission for intracerebral hemorrhage on 11/11/14.Luis Johnson is an 59 y.o. male with a past medical history significant for HTN, presents to the ED 11/11/2014 for evaluation of left sided numbness, blurred vision, dysarthria. Patient indicated that he went to bed last night 11/10/2014 "with a small HA" and when he woke up this morning his left arm was numb, speech was slurred, his vision was blurry, and the HA was still there. Denies associated vertigo, double vision, focal weakness, imbalance, or confusion. Further, no recent head trauma, no use of anticoagulants. CT brain showed acute hemorrhage involving the left parietal occipital lobe measuring 1.7 x 1.9 cm as well as generalized bilateral cerebral edema with low-density in the bilateral frontal/parietal and temporal white matter. No hydrocephalus or midline shift. Serologies reviewed: platelets 253, PTT 2, INR 0.98, PTT 27. BP was 155/94 mmHg in the ED.  ICH score: 0 CT scan of the head as well as MRI scan of the brain which have personally reviewed showed an acute left occipital pole hemorrhage which was 2.4 x 2 x 2.2 cm with mild surrounding vasogenic edema. There was remote age encephalomalacia and blood products noted in the right parieto-occipital lobe as well from his prior hemorrhage from 2007. MRA of the brain showed no large vessel stenosis or occlusion. Carotid ultrasound showed no extracranial stenosis. Transthoracic echo showed normal ejection fraction without cardiac source of embolism. LDL cholesterol was significantly elevated at 231 and hemoglobin A1c was 6.1. Patient had not been on any antithrombotic prior to admission. He was found to have mild  right upper quadrant visual field deficit and right facial droop. He was seen by physical and was seen by occupational therapy and had no physical therapy needs. He was advised not to drive and to resume his home blood pressure medications. He states he continues to have some vision difficulties and has does not noticed any significant memory problems. He has some mild headaches but these are not bothersome. Patient does have history of dementia and his mother what that was late in life. States his blood pressure is well controlled and today it is 127/77. He has short-term disability paperwork to be filled out today. He has been started on Lipitor which is tolerating well without side effects.  ROS:   14 system review of systems is positive for  headache, vision difficulties, memory loss eye itching and all other systems negative  PMH:  Past Medical History  Diagnosis Date  . Hypertension     Social History:  Social History   Social History  . Marital Status: Married    Spouse Name: N/A  . Number of Children: N/A  . Years of Education: N/A   Occupational History  . Not on file.   Social History Main Topics  . Smoking status: Former Smoker    Quit date: 01/21/1994  . Smokeless tobacco: Not on file  . Alcohol Use: No  . Drug Use: No  . Sexual Activity: Not on file   Other Topics Concern  . Not on file   Social History Narrative    Medications:   Current Outpatient Prescriptions on File Prior to Visit  Medication Sig Dispense Refill  .  acetaminophen (TYLENOL) 325 MG tablet Take 650 mg by mouth every 6 (six) hours as needed for mild pain.    Marland Kitchen albuterol-ipratropium (COMBIVENT) 18-103 MCG/ACT inhaler Inhale 1 puff into the lungs every 4 (four) hours as needed for wheezing or shortness of breath.    Marland Kitchen amLODipine (NORVASC) 10 MG tablet Take 10 mg by mouth daily.    Marland Kitchen atorvastatin (LIPITOR) 80 MG tablet Take 1 tablet (80 mg total) by mouth daily. 30 tablet 2   No current  facility-administered medications on file prior to visit.    Allergies:   Allergies  Allergen Reactions  . Eggs Or Egg-Derived Products Swelling    Physical Exam General: well developed, well nourished, seated, in no evident distress Head: head normocephalic and atraumatic.  Neck: supple with no carotid or supraclavicular bruits Cardiovascular: regular rate and rhythm, no murmurs Musculoskeletal: no deformity Skin:  no rash/petichiae Vascular:  Normal pulses all extremities Filed Vitals:   11/27/14 1558  BP: 129/77  Pulse: 92   Neurologic Exam Mental Status: Awake and fully alert. Oriented to place and time. Recent and remote memory intact. Attention span, concentration and fund of knowledge appropriate. Mood and affect appropriate.  Cranial Nerves: Fundoscopic exam reveals sharp disc margins. Pupils equal, briskly reactive to light. Extraocular movements full without nystagmus. 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 Hearing intact. Facial sensation intact. Face, tongue, palate moves normally and symmetrically.  Motor: Normal bulk and tone. Normal strength in all tested extremity muscles. Sensory.: intact to touch ,pinprick .position and vibratory sensation.  Coordination: Rapid alternating movements normal in all extremities. Finger-to-nose and heel-to-shin performed accurately bilaterally. Gait and Station: Arises from chair without difficulty. Stance is normal. Gait demonstrates normal stride length and balance . Able to heel, toe and tandem walk without difficulty.  Reflexes: 1+ and symmetric. Toes downgoing.   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 Remote history of similar right occipital parenchymal hemorrhage in 2006. History of hypertension but it appears to be well  controlled. Hyperlipidemia.    PLAN: I had a long d/w patient and wife about his recent brain hemorrhage, discussed possible etiology including amyloid angiopathy versus hypertensive risk for recurrent stroke/TIAs, personally independently reviewed imaging studies and stroke evaluation results and answered questions.Avoid antiplatelets agents for secondary stroke prevention given history of brain hemorrhage and maintain strict control of hypertension with blood pressure goal below 130/90, diabetes with hemoglobin A1c goal below 6.5% and lipids with LDL cholesterol goal below 100 mg/dL. I also advised the patient to eat a healthy diet with plenty of whole grains, cereals, fruits and vegetables, exercise regularly and maintain ideal body weight he was advised not to drive his car as well as tractor trailer till his peripheral vision improves. I will refer him to ophthalmologist for computerized visual field testing in detail eye exam for clearance for driving. Short-term disability paperwork for the patient was completed during this visit.Greater than 50% of time during this 25 minute visit was spent on counseling, discussion with patient and family and coordination of care Followup in the future with me in 3 months or call earlier if necessary.  Delia Heady, MD  Note: This document was prepared with digital dictation and possible smart phrase technology. Any transcriptional errors that result from this process are unintentional

## 2014-11-28 NOTE — Telephone Encounter (Signed)
Form,[employer] completed by Dr Pearlean Brownie and Mikeal Hawthorne faxed 11/27/14.

## 2015-01-02 ENCOUNTER — Encounter: Payer: Self-pay | Admitting: Neurology

## 2015-01-07 NOTE — Telephone Encounter (Signed)
Patient returned call and also requested copy of completed FMLA form.

## 2015-01-07 NOTE — Telephone Encounter (Signed)
Called pt back. Spoke to wife (okay per DPR) and told her FMLA form is ready to be picked up at the front. She verbalized understanding.  Placed form up front.

## 2015-02-20 ENCOUNTER — Ambulatory Visit (INDEPENDENT_AMBULATORY_CARE_PROVIDER_SITE_OTHER): Payer: BLUE CROSS/BLUE SHIELD | Admitting: Neurology

## 2015-02-20 ENCOUNTER — Encounter: Payer: Self-pay | Admitting: Neurology

## 2015-02-20 VITALS — BP 146/80 | HR 89 | Ht 67.0 in | Wt 171.6 lb

## 2015-02-20 DIAGNOSIS — H53461 Homonymous bilateral field defects, right side: Secondary | ICD-10-CM

## 2015-02-20 NOTE — Patient Instructions (Signed)
I had a long discussion with the patient and family regarding his remote intracerebral hemorrhages and mild residual vision field loss. I instructed him to be careful with his driving and to limit his driving to ideal conditions. He was advised to maintain strict control of hypertension with blood pressure goal below 130/90 and lipids with LDL cholesterol goal below 70 mg percent. Was advised to return for follow-up in 6 months or call earlier if necessary

## 2015-02-20 NOTE — Progress Notes (Signed)
[facility] [address] [place]. [place] [address] [phone]       OFFICE FOLLOW-UP NOTE  Luis. Luis Johnson Date of Birth:  1955-08-04 Medical Record Number:  [id number]   HPI: Luis Johnson is a 59 year male seen today for the first office follow-up visit for in-hospital admission for intracerebral hemorrhage on 11/11/14.Luis Johnson is an 59 y.o. male with a past medical history significant for HTN, presents to the ED 11/11/2014 for evaluation of left sided numbness, blurred vision, dysarthria. Patient indicated that he went to bed last night 11/10/2014 "with a small HA" and when he woke up this morning his left arm was numb, speech was slurred, his vision was blurry, and the HA was still there. Denies associated vertigo, double vision, focal weakness, imbalance, or confusion. Further, no recent head trauma, no use of anticoagulants. CT brain showed acute hemorrhage involving the left parietal occipital lobe measuring 1.7 x 1.9 cm as well as generalized bilateral cerebral edema with low-density in the bilateral frontal/parietal and temporal white matter. No hydrocephalus or midline shift. Serologies reviewed: platelets 253, PTT 2, INR 0.98, PTT 27. BP was 155/94 mmHg in the ED.  ICH score: 0 CT scan of the head as well as MRI scan of the brain which have personally reviewed showed an acute left occipital pole hemorrhage which was 2.4 x 2 x 2.2 cm with mild surrounding vasogenic edema. There was remote age encephalomalacia and blood products noted in the right parieto-occipital lobe as well from his prior hemorrhage from 2007. MRA of the brain showed no large vessel stenosis or occlusion. Carotid ultrasound showed no extracranial stenosis. Transthoracic echo showed normal ejection fraction without cardiac source of embolism. LDL cholesterol was significantly elevated at 231 and hemoglobin A1c was 6.1. Patient had not been on any antithrombotic prior to admission. He was found to have mild  right upper quadrant visual field deficit and right facial droop. He was seen by physical and was seen by occupational therapy and had no physical therapy needs. He was advised not to drive and to resume his home blood pressure medications. He states he continues to have some vision difficulties and has does not noticed any significant memory problems. He has some mild headaches but these are not bothersome. Patient does have history of dementia and his mother what that was late in life. States his blood pressure is well controlled and today it is 127/77. He has short-term disability paperwork to be filled out today. He has been started on Lipitor which is tolerating well without side effects. Update 02/20/2015 : He returns for follow-up after last visit 3 months ago. He states he has been to the eye doctor Dr. Elmer Picker who did a computerized visual field testing and told him that he could drive but to be careful. I do not have the copy of her office notes. Patient has been driving and has not had any near misses or accidents yet. He has mild memory difficulties which are unchanged and not progressive. His blood pressure is quite well controlled at home but he does get nervous and goes to doctors office and it is slightly elevated today at 146/80 in our office. He has no new complaints. He walks with a cane his balance is good and his and no recent falls. His tolerating Norvasc and Lipitor quite well without any side effects. ROS:   14 system review of systems is positive for   activity and appetite change, eye itching, light sensitive, eye  pain, excessive thirst, nausea, vomiting, insomnia, frequent waking, snoring, sleep talking, food allergies, frequency of urination, joint and back pain, aching muscles, muscle cramps, memory loss, headache, numbness, speech difficulty, agitation, depression and nervousness and all other systems negative  PMH:  Past Medical History  Diagnosis Date  . Hypertension   . Stroke  [facility](HCC)     Social History:  Social History   Social History  . Marital Status: Married    Spouse Name: N/A  . Number of Children: N/A  . Years of Education: N/A   Occupational History  . Not on file.   Social History Main Topics  . Smoking status: Former Smoker    Quit date: 01/21/1994  . Smokeless tobacco: Not on file  . Alcohol Use: No  . Drug Use: No  . Sexual Activity: Not on file   Other Topics Concern  . Not on file   Social History Narrative    Medications:   Current Outpatient Prescriptions on File Prior to Visit  Medication Sig Dispense Refill  . acetaminophen (TYLENOL) 325 MG tablet Take 650 mg by mouth every 6 (six) hours as needed for mild pain.    Marland Kitchen. albuterol-ipratropium (COMBIVENT) 18-103 MCG/ACT inhaler Inhale 1 puff into the lungs every 4 (four) hours as needed for wheezing or shortness of breath.    Marland Kitchen. amLODipine (NORVASC) 10 MG tablet Take 10 mg by mouth daily.    Marland Kitchen. atorvastatin (LIPITOR) 80 MG tablet Take 1 tablet (80 mg total) by mouth daily. 30 tablet 2   No current facility-administered medications on file prior to visit.    Allergies:   Allergies  Allergen Reactions  . Eggs Or Egg-Derived Products Swelling    Physical Exam General: well developed, well nourished elderly African-American male, seated, in no evident distress Head: head normocephalic and atraumatic.  Neck: supple with no carotid or supraclavicular bruits Cardiovascular: regular rate and rhythm, no murmurs Musculoskeletal: no deformity Skin:  no rash/petichiae Vascular:  Normal pulses all extremities Filed Vitals:   02/20/15 1614  BP: 146/80  Pulse: 89   Neurologic Exam Mental Status: Awake and fully alert. Oriented to place and time. Recent and remote memory intact. Attention span, concentration and fund of knowledge appropriate. Mood and affect appropriate.  Cranial Nerves: Fundoscopic exam not donec   Pupils equal, briskly reactive to light. Extraocular movements full  without nystagmus. 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 Hearing intact. Facial sensation intact. Face, tongue, palate moves normally and symmetrically.  Motor: Normal bulk and tone. Normal strength in all tested extremity muscles. Sensory.: intact to touch ,pinprick .position and vibratory sensation.  Coordination: Rapid alternating movements normal in all extremities. Finger-to-nose and heel-to-shin performed accurately bilaterally. Gait and Station: Arises from chair without difficulty. Stance is normal. Gait demonstrates normal stride length and balance . Able to heel, toe and tandem walk without difficulty.  Reflexes: 1+ and symmetric. Toes downgoing.   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 Remote history of similar right occipital parenchymal hemorrhage in 2006. History of hypertension but it appears to be well controlled. Hyperlipidemia.    PLAN: I had a long discussion with the patient and family regarding his remote intracerebral hemorrhages and mild residual vision field loss. I instructed him to be careful with his driving and to limit his driving to ideal conditions. He was advised  to maintain strict control of hypertension with blood pressure goal below 130/90 and lipids with LDL cholesterol goal below 70 mg percent. He was advised to return for follow-up in 6 months or call earlier if necessary  Delia Heady, MD  Note: This document was prepared with digital dictation and possible smart phrase technology. Any transcriptional errors that result from this process are unintentional

## 2015-03-21 DIAGNOSIS — Z0289 Encounter for other administrative examinations: Secondary | ICD-10-CM

## 2015-08-01 ENCOUNTER — Telehealth: Payer: Self-pay | Admitting: *Deleted

## 2015-08-01 NOTE — Telephone Encounter (Signed)
Records faxed on 07/30/15.

## 2015-08-21 ENCOUNTER — Ambulatory Visit: Payer: BLUE CROSS/BLUE SHIELD | Admitting: Neurology

## 2015-08-22 ENCOUNTER — Encounter: Payer: Self-pay | Admitting: Neurology

## 2015-08-26 ENCOUNTER — Telehealth: Payer: Self-pay | Admitting: Neurology

## 2015-08-26 NOTE — Telephone Encounter (Signed)
Pt called to r/s appt but Dr Pearlean BrownieSethi is booked into August. Pt requested to be seen sooner, operator scheduled with Luis Johnson. Can NP see this pt?

## 2015-08-26 NOTE — Telephone Encounter (Signed)
ok 

## 2015-08-27 NOTE — Telephone Encounter (Signed)
Patient already schedule with Luis Johnson on 09/17/2015 for stroke follow up.

## 2015-09-17 ENCOUNTER — Encounter: Payer: PRIVATE HEALTH INSURANCE | Admitting: Nurse Practitioner

## 2015-09-17 ENCOUNTER — Encounter: Payer: Self-pay | Admitting: Nurse Practitioner

## 2015-09-17 NOTE — Progress Notes (Signed)
This encounter was created in error - please disregard.

## 2015-09-24 ENCOUNTER — Ambulatory Visit (INDEPENDENT_AMBULATORY_CARE_PROVIDER_SITE_OTHER): Payer: Managed Care, Other (non HMO) | Admitting: Nurse Practitioner

## 2015-09-24 ENCOUNTER — Encounter: Payer: Self-pay | Admitting: Nurse Practitioner

## 2015-09-24 VITALS — BP 158/93 | HR 79 | Ht 67.0 in | Wt 160.6 lb

## 2015-09-24 DIAGNOSIS — H53461 Homonymous bilateral field defects, right side: Secondary | ICD-10-CM | POA: Diagnosis not present

## 2015-09-24 DIAGNOSIS — I611 Nontraumatic intracerebral hemorrhage in hemisphere, cortical: Secondary | ICD-10-CM | POA: Diagnosis not present

## 2015-09-24 NOTE — Progress Notes (Signed)
[facility]  PATIENT: Luis Johnson DOB: December 13, 1955   REASON FOR VISIT: right homonymous   Hemianopsia, ICH HISTORY FROM: Patient    HISTORY OF PRESENT ILLNESS:Luis Johnson is a [age] year male seen today for the first office follow-up visit for in-hospital admission for intracerebral hemorrhage on 11/11/14.Luis Johnson is an 60 y.o. male with a past medical history significant for HTN, presents to the ED 11/11/2014 for evaluation of left sided numbness, blurred vision, dysarthria. Patient indicated that he went to bed last night 11/10/2014 "with a small HA" and when he woke up this morning his left arm was numb, speech was slurred, his vision was blurry, and the HA was still there. Denies associated vertigo, double vision, focal weakness, imbalance, or confusion. Further, no recent head trauma, no use of anticoagulants. CT brain showed acute hemorrhage involving the left parietal occipital lobe measuring 1.7 x 1.9 cm as well as generalized bilateral cerebral edema with low-density in the bilateral frontal/parietal and temporal white matter. No hydrocephalus or midline shift. Serologies reviewed: platelets 253, PTT 2, INR 0.98, PTT 27. BP was 155/94 mmHg in the ED.  ICH score: 0 CT scan of the head as well as MRI scan of the brain which have personally reviewed showed an acute left occipital pole hemorrhage which was 2.4 x 2 x 2.2 cm with mild surrounding vasogenic edema. There was remote age encephalomalacia and blood products noted in the right parieto-occipital lobe as well from his prior hemorrhage from 2007. MRA of the brain showed no large vessel stenosis or occlusion. Carotid ultrasound showed no extracranial stenosis. Transthoracic echo showed normal ejection fraction without cardiac source of embolism. LDL cholesterol was significantly elevated at 231 and hemoglobin A1c was 6.1. Patient had not been on any antithrombotic prior to admission. He was found to have mild right upper  quadrant visual field deficit and right facial droop. He was seen by physical and was seen by occupational therapy and had no physical therapy needs. He was advised not to drive and to resume his home blood pressure medications. He states he continues to have some vision difficulties and has does not noticed any significant memory problems. He has some mild headaches but these are not bothersome. Patient does have history of dementia and his mother what that was late in life. States his blood pressure is well controlled and today it is 127/77. He has short-term disability paperwork to be filled out today. He has been started on Lipitor which is tolerating well without side effects. Update 02/20/2015 PS: He returns for follow-up after last visit 3 months ago. He states he has been to the eye doctor Dr. Elmer PickerHecker who did a computerized visual field testing and told him that he could drive but to be careful. I do not have the copy of her office notes. Patient has been driving and has not had any near misses or accidents yet. He has mild memory difficulties which are unchanged and not progressive. His blood pressure is quite well controlled at home but he does get nervous and goes to doctors office and it is slightly elevated today at 146/80 in our office. He has no new complaints. He walks with a cane his balance is good and his and no recent falls. His tolerating Norvasc and Lipitor quite well without any side effects. UPDATE  07/12/2017CM Luis Johnson, 60 year old male returns for followup.He has history of intracranial hemorrhage and right hemianopsia.He has returned to driving without any accidents. He is having left  shoulder replacement at the [facility] soon.  His blood pressure is elevated in office today . [facility] He ambulates with a cane.  No recent falls.Tolerating Norvasc and Lipitor quite well without any side effects. He returns for reevaluation.   REVIEW OF SYSTEMS: Full 14 system review  of systems performed and notable only for those listed, all others are neg:  Constitutional: neg  Cardiovascular: neg Ear/Nose/Throat: hearing loss  Skin: neg Eyes:  allergies Respiratory: neg Gastroitestinal: neg  Hematology/Lymphatic: neg  Endocrine: neg Musculoskeletal: joint pain Allergy/Immunology: neg Neurological: neg Psychiatric:  anxiety Sleep : neg   ALLERGIES: Allergies  Allergen Reactions  . Eggs Or Egg-Derived Products Swelling    HOME MEDICATIONS: Outpatient Prescriptions Prior to Visit  Medication Sig Dispense Refill  . acetaminophen (TYLENOL) 325 MG tablet Take 650 mg by mouth every 6 (six) hours as needed for mild pain.    Marland Kitchen albuterol-ipratropium (COMBIVENT) 18-103 MCG/ACT inhaler Inhale 1 puff into the lungs every 4 (four) hours as needed for wheezing or shortness of breath.    Marland Kitchen amLODipine (NORVASC) 10 MG tablet Take 10 mg by mouth daily.    Marland Kitchen atorvastatin (LIPITOR) 80 MG tablet Take 1 tablet (80 mg total) by mouth daily. 30 tablet 2  . QVAR 80 MCG/ACT inhaler INHALE 2 PUFFS TWICE DAILY FOR 30 DAYS  0   No facility-administered medications prior to visit.    PAST MEDICAL HISTORY: Past Medical History  Diagnosis Date  . Hypertension   . Stroke [facility])     PAST SURGICAL HISTORY: No past surgical history on file.  FAMILY HISTORY: Family History  Problem Relation Age of Onset  . Seizures Mother   . Heart failure Mother   . Stroke Brother   . Heart attack Brother     SOCIAL HISTORY: Social History   Social History  . Marital Status: Married    Spouse Name: N/A  . Number of Children: N/A  . Years of Education: N/A   Occupational History  . Not on file.   Social History Main Topics  . Smoking status: Former Smoker    Quit date: 01/21/1994  . Smokeless tobacco: Not on file  . Alcohol Use: No  . Drug Use: No  . Sexual Activity: Not on file   Other Topics Concern  . Not on file   Social History Narrative     PHYSICAL  EXAM  Filed Vitals:   09/24/15 1524  Height: 5\' 7"  (1.702 m)   There is no weight on file to calculate BMI. General: well developed, well nourished  African-American male, seated, in no evident distress Head: head normocephalic and atraumatic.  Neck: supple with no carotid or supraclavicular bruits Cardiovascular: regular rate and rhythm, no murmurs Musculoskeletal: no deformity Skin: no rash/petichiae Vascular: Normal pulses all extremities  Neurological examination  Mental Status: Awake and fully alert. Oriented to place and time. Recent and remote memory intact. Attention span, concentration and fund of knowledge appropriate. Mood and affect appropriate.  Cranial Nerves: Fundoscopic exam not done Pupils equal, briskly reactive to light. Extraocular movements full without nystagmus. 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 Hearing intact. Facial sensation intact. Face, tongue, palate moves normally and symmetrically.  Motor: Normal bulk and tone. Normal strength in all tested extremity muscles except left shoulder. Sensory.: intact to touch ,pinprick .position and vibratory sensation.  Coordination: Rapid alternating movements normal in all extremities. Finger-to-nose and heel-to-shin performed accurately bilaterally. Gait and Station:  Arises from chair without difficulty. Stance is normal. Gait demonstrates normal stride length and balance . Able to heel, toe and tandem walk without difficulty.  Reflexes: 1+ and symmetric. Toes downgoing.   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 Remote history of similar right occipital parenchymal hemorrhage in 2006. History of hypertension but it appears to be well controlled. Hyperlipidemia.The patient is a current  patient of Dr. Pearlean Brownie  who is out of the office today . This note is sent to the work in doctor.     Mild  Residual vision field loss , limit driving to ideal  conditions Strict control of hypertension with blood pressure goal below 130/90  Lipids with LDL cholesterol goal below 70 mg percent.Followed by [facility] clinic He was advised to return for follow-up in 1 year Nilda Riggs, [facility], [facility], APRN  [facility] [address], [address] [place], [place] [address] [phone]

## 2015-09-24 NOTE — Progress Notes (Signed)
I have read the note, and I agree with the clinical assessment and plan.  Eva Griffo KEITH   

## 2015-09-24 NOTE — Patient Instructions (Addendum)
Mild  Residual vision field loss , limit driving to ideal  conditions Strict control of hypertension with blood pressure goal below 130/90 todays reading 158/93 Lipids with LDL cholesterol goal below 70 mg percent. He was advised to return for follow-up in 1 year

## 2015-09-25 ENCOUNTER — Telehealth: Payer: Self-pay

## 2015-09-25 NOTE — Telephone Encounter (Signed)
OFFICE NOTES FROM [facility] FAX TO SHELLY FEAGANS TWICE AND RECEIVE.

## 2015-09-29 NOTE — Telephone Encounter (Signed)
UNable to leave vm with Shelly at [employer] with number listed. Extension is asking for social security number. Rn will call back tomorrow.

## 2015-09-29 NOTE — Telephone Encounter (Signed)
Shelly/[employer] [phone] called said she rec'd paperwork but it is unclear if the Dr is keeping the pt out of work, and if there are any restrictions. A msg can be left on VM. She said they handle pt's long term disability claim

## 2016-02-08 ENCOUNTER — Inpatient Hospital Stay (HOSPITAL_COMMUNITY)
Admission: EM | Admit: 2016-02-08 | Discharge: 2016-02-11 | DRG: 872 | Disposition: A | Discharge status: Home / Self Care | Payer: Managed Care, Other (non HMO) | Attending: Oncology | Admitting: Oncology

## 2016-02-08 ENCOUNTER — Encounter (HOSPITAL_COMMUNITY): Payer: Self-pay | Admitting: Emergency Medicine

## 2016-02-08 ENCOUNTER — Emergency Department (HOSPITAL_COMMUNITY): Payer: Managed Care, Other (non HMO)

## 2016-02-08 DIAGNOSIS — R740 Nonspecific elevation of levels of transaminase and lactic acid dehydrogenase [LDH]: Secondary | ICD-10-CM | POA: Diagnosis present

## 2016-02-08 DIAGNOSIS — J449 Chronic obstructive pulmonary disease, unspecified: Secondary | ICD-10-CM | POA: Diagnosis present

## 2016-02-08 DIAGNOSIS — A419 Sepsis, unspecified organism: Secondary | ICD-10-CM | POA: Diagnosis present

## 2016-02-08 DIAGNOSIS — R351 Nocturia: Secondary | ICD-10-CM | POA: Diagnosis present

## 2016-02-08 DIAGNOSIS — R000 Tachycardia, unspecified: Secondary | ICD-10-CM | POA: Diagnosis present

## 2016-02-08 DIAGNOSIS — N3001 Acute cystitis with hematuria: Secondary | ICD-10-CM | POA: Diagnosis not present

## 2016-02-08 DIAGNOSIS — Z823 Family history of stroke: Secondary | ICD-10-CM

## 2016-02-08 DIAGNOSIS — Z87891 Personal history of nicotine dependence: Secondary | ICD-10-CM

## 2016-02-08 DIAGNOSIS — Z833 Family history of diabetes mellitus: Secondary | ICD-10-CM

## 2016-02-08 DIAGNOSIS — F329 Major depressive disorder, single episode, unspecified: Secondary | ICD-10-CM | POA: Diagnosis present

## 2016-02-08 DIAGNOSIS — R350 Frequency of micturition: Secondary | ICD-10-CM | POA: Diagnosis present

## 2016-02-08 DIAGNOSIS — Z8673 Personal history of transient ischemic attack (TIA), and cerebral infarction without residual deficits: Secondary | ICD-10-CM | POA: Diagnosis not present

## 2016-02-08 DIAGNOSIS — Z1611 Resistance to penicillins: Secondary | ICD-10-CM | POA: Diagnosis present

## 2016-02-08 DIAGNOSIS — N179 Acute kidney failure, unspecified: Secondary | ICD-10-CM | POA: Diagnosis present

## 2016-02-08 DIAGNOSIS — R972 Elevated prostate specific antigen [PSA]: Secondary | ICD-10-CM | POA: Diagnosis present

## 2016-02-08 DIAGNOSIS — N410 Acute prostatitis: Secondary | ICD-10-CM | POA: Diagnosis present

## 2016-02-08 DIAGNOSIS — N390 Urinary tract infection, site not specified: Secondary | ICD-10-CM | POA: Diagnosis present

## 2016-02-08 DIAGNOSIS — K567 Ileus, unspecified: Secondary | ICD-10-CM | POA: Diagnosis present

## 2016-02-08 DIAGNOSIS — Z1623 Resistance to quinolones and fluoroquinolones: Secondary | ICD-10-CM | POA: Diagnosis present

## 2016-02-08 DIAGNOSIS — B9620 Unspecified Escherichia coli [E. coli] as the cause of diseases classified elsewhere: Secondary | ICD-10-CM | POA: Diagnosis present

## 2016-02-08 DIAGNOSIS — Z8249 Family history of ischemic heart disease and other diseases of the circulatory system: Secondary | ICD-10-CM

## 2016-02-08 DIAGNOSIS — Z79899 Other long term (current) drug therapy: Secondary | ICD-10-CM

## 2016-02-08 DIAGNOSIS — R7401 Elevation of levels of liver transaminase levels: Secondary | ICD-10-CM | POA: Diagnosis present

## 2016-02-08 DIAGNOSIS — I10 Essential (primary) hypertension: Secondary | ICD-10-CM | POA: Diagnosis present

## 2016-02-08 DIAGNOSIS — B9689 Other specified bacterial agents as the cause of diseases classified elsewhere: Secondary | ICD-10-CM | POA: Diagnosis not present

## 2016-02-08 LAB — COMPREHENSIVE METABOLIC PANEL
ALBUMIN: 3.3 g/dL — AB (ref 3.5–5.0)
ALK PHOS: 65 U/L (ref 38–126)
ALT: 104 U/L — AB (ref 17–63)
AST: 100 U/L — ABNORMAL HIGH (ref 15–41)
Anion gap: 8 (ref 5–15)
BILIRUBIN TOTAL: 0.7 mg/dL (ref 0.3–1.2)
BUN: 15 mg/dL (ref 6–20)
CALCIUM: 8.7 mg/dL — AB (ref 8.9–10.3)
CO2: 23 mmol/L (ref 22–32)
CREATININE: 1.45 mg/dL — AB (ref 0.61–1.24)
Chloride: 105 mmol/L (ref 101–111)
GFR calc Af Amer: 59 mL/min — ABNORMAL LOW (ref >60)
GFR calc non Af Amer: 51 mL/min — ABNORMAL LOW (ref >60)
GLUCOSE: 131 mg/dL — AB (ref 65–99)
Potassium: 3.5 mmol/L (ref 3.5–5.1)
SODIUM: 136 mmol/L (ref 135–145)
TOTAL PROTEIN: 7 g/dL (ref 6.5–8.1)

## 2016-02-08 LAB — CBC WITH DIFFERENTIAL/PLATELET
BASOS ABS: 0 10*3/uL (ref 0.0–0.1)
Basophils Relative: 0 %
EOS ABS: 0 10*3/uL (ref 0.0–0.7)
EOS PCT: 0 %
HCT: 40.7 % (ref 39.0–52.0)
HEMOGLOBIN: 13.5 g/dL (ref 13.0–17.0)
LYMPHS ABS: 0.7 10*3/uL (ref 0.7–4.0)
Lymphocytes Relative: 5 %
MCH: 26.7 pg (ref 26.0–34.0)
MCHC: 33.2 g/dL (ref 30.0–36.0)
MCV: 80.4 fL (ref 78.0–100.0)
Monocytes Absolute: 0.7 10*3/uL (ref 0.1–1.0)
Monocytes Relative: 5 %
NEUTROS PCT: 90 %
Neutro Abs: 12.1 10*3/uL — ABNORMAL HIGH (ref 1.7–7.7)
PLATELETS: 209 10*3/uL (ref 150–400)
RBC: 5.06 MIL/uL (ref 4.22–5.81)
RDW: 15 % (ref 11.5–15.5)
WBC: 13.5 10*3/uL — AB (ref 4.0–10.5)

## 2016-02-08 LAB — I-STAT CG4 LACTIC ACID, ED: LACTIC ACID, VENOUS: 1.13 mmol/L (ref 0.5–1.9)

## 2016-02-08 LAB — URINE MICROSCOPIC-ADD ON

## 2016-02-08 LAB — PROTIME-INR
INR: 1.2
PROTHROMBIN TIME: 15.2 s (ref 11.4–15.2)

## 2016-02-08 LAB — URINALYSIS, ROUTINE W REFLEX MICROSCOPIC
BILIRUBIN URINE: NEGATIVE
Glucose, UA: NEGATIVE mg/dL
KETONES UR: 15 mg/dL — AB
NITRITE: NEGATIVE
PH: 5 (ref 5.0–8.0)
PROTEIN: 30 mg/dL — AB
Specific Gravity, Urine: 1.023 (ref 1.005–1.030)

## 2016-02-08 LAB — I-STAT TROPONIN, ED: TROPONIN I, POC: 0 ng/mL (ref 0.00–0.08)

## 2016-02-08 MED ORDER — SODIUM CHLORIDE 0.9 % IV BOLUS (SEPSIS)
1000.0000 mL | Freq: Once | INTRAVENOUS | Status: AC
  Administered 2016-02-09: 1000 mL via INTRAVENOUS

## 2016-02-08 MED ORDER — VANCOMYCIN HCL IN DEXTROSE 1-5 GM/200ML-% IV SOLN
1000.0000 mg | Freq: Once | INTRAVENOUS | Status: DC
  Filled 2016-02-08: qty 200

## 2016-02-08 MED ORDER — SODIUM CHLORIDE 0.9 % IV BOLUS (SEPSIS)
250.0000 mL | Freq: Once | INTRAVENOUS | Status: AC
  Administered 2016-02-09: 250 mL via INTRAVENOUS

## 2016-02-08 MED ORDER — PIPERACILLIN-TAZOBACTAM 3.375 G IVPB 30 MIN
3.3750 g | Freq: Once | INTRAVENOUS | Status: DC
  Filled 2016-02-08: qty 50

## 2016-02-08 MED ORDER — SODIUM CHLORIDE 0.9 % IV SOLN
1000.0000 mL | INTRAVENOUS | Status: DC
  Administered 2016-02-09 (×2): 1000 mL via INTRAVENOUS

## 2016-02-08 MED ORDER — SODIUM CHLORIDE 0.9 % IV BOLUS (SEPSIS)
1000.0000 mL | Freq: Once | INTRAVENOUS | Status: AC
Start: 2016-02-08 — End: 2016-02-09
  Administered 2016-02-09: 1000 mL via INTRAVENOUS

## 2016-02-08 NOTE — ED Notes (Signed)
Pt reports "a little" pain to center of chest that just started.  EKG in process of being obtained.

## 2016-02-08 NOTE — ED Triage Notes (Signed)
[facility]  He was told to go to ED if fever over 100.5 after procedure.  Wife states he didn't feel good yesterday and just layed around.  Decreased appetite.  She checked his fever tonight and it was 105.0.  Pt reports tightness to abd.  Last BM on Wednesday.  Took Tylenol 1000mg  1 hour ago.

## 2016-02-08 NOTE — ED Provider Notes (Signed)
TIME SEEN: 11:30 AM  CHIEF COMPLAINT: Fever  HPI: Pt is a 60 y.o. male with history of hypertension, CVA who presents to the emergency department with fever. Fever started yesterday. Measured today at 105 at home. Given a gram of Tylenol prior to arrival. Patient reports not feeling well. Denies headache, cough, sore throat, ear pain, neck pain or neck stiffness, current chest pain, shortness of breath, vomiting, diarrhea, rash. States he has been constipated for the past several days. Last bowel movement was 2 days ago. He is passing gas. No abdominal pain. No history of abdominal surgery or bowel obstruction. Did have a prostate biopsy at the [facility] by Dr. Caswell CorwinStubbs 2 days ago. Was on prophylactic Cipro which she finished yesterday, total of 6 doses.  ROS: See HPI Constitutional: no fever  Eyes: no drainage  ENT: no runny nose   Cardiovascular:  no chest pain  Resp: no SOB  GI: no vomiting GU: no dysuria Integumentary: no rash  Allergy: no hives  Musculoskeletal: no leg swelling  Neurological: no slurred speech ROS otherwise negative  PAST MEDICAL HISTORY/PAST SURGICAL HISTORY:  Past Medical History:  Diagnosis Date  . Hypertension   . Stroke [facility](HCC)     MEDICATIONS:  Prior to Admission medications   Medication Sig Start Date End Date Taking? Authorizing Provider  acetaminophen (TYLENOL) 325 MG tablet Take 1,000 mg by mouth 3 (three) times daily as needed for mild pain.     Historical Provider, MD  albuterol-ipratropium (COMBIVENT) 18-103 MCG/ACT inhaler Inhale 1 puff into the lungs every 4 (four) hours as needed for wheezing or shortness of breath.    Historical Provider, MD  amLODipine (NORVASC) 10 MG tablet Take 10 mg by mouth daily.    Historical Provider, MD  diphenhydrAMINE (BENADRYL) 25 MG tablet Take 50 mg by mouth every 6 (six) hours as needed.    Historical Provider, MD  doxycycline (VIBRAMYCIN) 100 MG capsule Take 100 mg by mouth 2 (two) times daily.    Historical  Provider, MD  EPINEPHrine 0.3 mg/0.3 mL IJ SOAJ injection Inject into the muscle as needed.    Historical Provider, MD  escitalopram (LEXAPRO) 10 MG tablet Take 10 mg by mouth daily.    Historical Provider, MD  methocarbamol (ROBAXIN) 500 MG tablet Take 500 mg by mouth 2 (two) times daily as needed for muscle spasms.    Historical Provider, MD  pravastatin (PRAVACHOL) 40 MG tablet Take 40 mg by mouth daily.    Historical Provider, MD  QVAR 80 MCG/ACT inhaler INHALE 2 PUFFS TWICE DAILY FOR 30 DAYS 01/08/15   Historical Provider, MD  traZODone (DESYREL) 100 MG tablet Take 100 mg by mouth at bedtime as needed for sleep.    Historical Provider, MD    ALLERGIES:  Allergies  Allergen Reactions  . Eggs Or Egg-Derived Products Swelling    SOCIAL HISTORY:  Social History  Substance Use Topics  . Smoking status: Former Smoker    Quit date: 01/21/1994  . Smokeless tobacco: Never Used  . Alcohol use No    FAMILY HISTORY: Family History  Problem Relation Age of Onset  . Seizures Mother   . Heart failure Mother   . Stroke Brother   . Heart attack Brother     EXAM: BP 109/81 (BP Location: Right Arm)   Pulse (!) 140   Temp 102.6 F (39.2 C) (Oral)   Resp 16   Wt 163 lb (73.9 kg)   SpO2 98%   BMI 25.53 kg/m  CONSTITUTIONAL: Alert and oriented and responds appropriately to questions. Well-appearing; well-nourished HEAD: Normocephalic EYES: Conjunctivae clear, PERRL, EOMI ENT: normal nose; no rhinorrhea; moist mucous membranes NECK: Supple, no meningismus, no nuchal rigidity, no LAD  CARD: Regular and tachycardic; S1 and S2 appreciated; no murmurs, no clicks, no rubs, no gallops RESP: Normal chest excursion without splinting or tachypnea; breath sounds clear and equal bilaterally; no wheezes, no rhonchi, no rales, no hypoxia or respiratory distress, speaking full sentences ABD/GI: Normal bowel sounds; non-distended; soft, non-tender, no rebound, no guarding, no peritoneal signs, no  hepatosplenomegaly BACK:  The back appears normal and is non-tender to palpation, there is no CVA tenderness EXT: Normal ROM in all joints; non-tender to palpation; no edema; normal capillary refill; no cyanosis, no calf tenderness or swelling    SKIN: Normal color for age and race; warm; no rash NEURO: Moves all extremities equally, sensation to light touch intact diffusely, cranial nerves II through XII intact, normal speech PSYCH: The patient's mood and manner are appropriate. Grooming and personal hygiene are appropriate.  MEDICAL DECISION MAKING: Patient here with fever, tachycardia, leukocytosis. Meets SIRS criteria. Will give broad-spectrum antibiotics, IV fluids. Abdomen soft and nontender. Doubt bowel obstruction based on his exam.  ED PROGRESS: Patient's source is likely his urine. Culture pending. Blood cultures also pending. We'll discuss with medicine for admission. PCP is at the [facility] in [place].  Patient does not want transfer to [facility]. [facility] at [facility].  No hypotension.  11:45 PM  Discussed patient's case with IM teaching service resident.  Recommend admission to tele, inpt bed.  I will place holding orders per their request. Patient and family (if present) updated with plan. Care transferred to IM service.  Attending is Dr. Cyndie ChimeGranfortuna.  I reviewed all nursing notes, vitals, pertinent old records, EKGs, labs, imaging (as available).    EKG Interpretation  Date/Time:  Sunday February 08 2016 22:22:21 EST Ventricular Rate:  133 PR Interval:  140 QRS Duration: 76 QT Interval:  290 QTC Calculation: 431 R Axis:   -130 Text Interpretation:  Sinus tachycardia Biatrial enlargement Right superior axis deviation Pulmonary disease pattern Abnormal ECG Rate is faster compared to prior EKG Confirmed by Callie Bunyard,  DO, Omunique Pederson ([id number]) on 02/08/2016 11:07:47 PM        CRITICAL CARE Performed by: Raelyn NumberWARD, Letishia Elliott N   Total critical care time: 45  minutes  Critical care time was exclusive of separately billable procedures and treating other patients.  Critical care was necessary to treat or prevent imminent or life-threatening deterioration.  Critical care was time spent personally by me on the following activities: development of treatment plan with patient and/or surrogate as well as nursing, discussions with consultants, evaluation of patient's response to treatment, examination of patient, obtaining history from patient or surrogate, ordering and performing treatments and interventions, ordering and review of laboratory studies, ordering and review of radiographic studies, pulse oximetry and re-evaluation of patient's condition.     Layla MawKristen N Andrya Roppolo, DO 02/09/16 [phone]

## 2016-02-09 ENCOUNTER — Encounter (HOSPITAL_COMMUNITY): Payer: Self-pay | Admitting: General Practice

## 2016-02-09 DIAGNOSIS — Z87891 Personal history of nicotine dependence: Secondary | ICD-10-CM

## 2016-02-09 DIAGNOSIS — R7401 Elevation of levels of liver transaminase levels: Secondary | ICD-10-CM | POA: Diagnosis present

## 2016-02-09 DIAGNOSIS — N410 Acute prostatitis: Secondary | ICD-10-CM | POA: Diagnosis present

## 2016-02-09 DIAGNOSIS — N179 Acute kidney failure, unspecified: Secondary | ICD-10-CM | POA: Diagnosis present

## 2016-02-09 DIAGNOSIS — A419 Sepsis, unspecified organism: Secondary | ICD-10-CM

## 2016-02-09 DIAGNOSIS — R740 Nonspecific elevation of levels of transaminase and lactic acid dehydrogenase [LDH]: Secondary | ICD-10-CM

## 2016-02-09 DIAGNOSIS — Z91012 Allergy to eggs: Secondary | ICD-10-CM

## 2016-02-09 DIAGNOSIS — J449 Chronic obstructive pulmonary disease, unspecified: Secondary | ICD-10-CM | POA: Diagnosis present

## 2016-02-09 DIAGNOSIS — Z7951 Long term (current) use of inhaled steroids: Secondary | ICD-10-CM

## 2016-02-09 DIAGNOSIS — I10 Essential (primary) hypertension: Secondary | ICD-10-CM | POA: Diagnosis present

## 2016-02-09 LAB — CBC WITH DIFFERENTIAL/PLATELET
Basophils Absolute: 0 10*3/uL (ref 0.0–0.1)
Basophils Relative: 0 %
EOS PCT: 0 %
Eosinophils Absolute: 0 10*3/uL (ref 0.0–0.7)
HEMATOCRIT: 37.4 % — AB (ref 39.0–52.0)
Hemoglobin: 12.3 g/dL — ABNORMAL LOW (ref 13.0–17.0)
LYMPHS ABS: 0.5 10*3/uL — AB (ref 0.7–4.0)
LYMPHS PCT: 5 %
MCH: 26.5 pg (ref 26.0–34.0)
MCHC: 32.9 g/dL (ref 30.0–36.0)
MCV: 80.4 fL (ref 78.0–100.0)
Monocytes Absolute: 0.7 10*3/uL (ref 0.1–1.0)
Monocytes Relative: 6 %
Neutro Abs: 9.8 10*3/uL — ABNORMAL HIGH (ref 1.7–7.7)
Neutrophils Relative %: 89 %
PLATELETS: 192 10*3/uL (ref 150–400)
RBC: 4.65 MIL/uL (ref 4.22–5.81)
RDW: 15.1 % (ref 11.5–15.5)
WBC: 11.1 10*3/uL — AB (ref 4.0–10.5)

## 2016-02-09 LAB — COMPREHENSIVE METABOLIC PANEL
ALBUMIN: 2.8 g/dL — AB (ref 3.5–5.0)
ALT: 82 U/L — ABNORMAL HIGH (ref 17–63)
AST: 69 U/L — AB (ref 15–41)
Alkaline Phosphatase: 59 U/L (ref 38–126)
Anion gap: 7 (ref 5–15)
BUN: 13 mg/dL (ref 6–20)
CHLORIDE: 108 mmol/L (ref 101–111)
CO2: 22 mmol/L (ref 22–32)
Calcium: 8.1 mg/dL — ABNORMAL LOW (ref 8.9–10.3)
Creatinine, Ser: 1.2 mg/dL (ref 0.61–1.24)
GFR calc Af Amer: >60 mL/min (ref >60)
GLUCOSE: 118 mg/dL — AB (ref 65–99)
POTASSIUM: 3.5 mmol/L (ref 3.5–5.1)
Sodium: 137 mmol/L (ref 135–145)
Total Bilirubin: 1 mg/dL (ref 0.3–1.2)
Total Protein: 6.3 g/dL — ABNORMAL LOW (ref 6.5–8.1)

## 2016-02-09 LAB — PROCALCITONIN: Procalcitonin: 44.77 ng/mL

## 2016-02-09 LAB — GRAM STAIN

## 2016-02-09 LAB — HIV ANTIBODY (ROUTINE TESTING W REFLEX): HIV Screen 4th Generation wRfx: NONREACTIVE

## 2016-02-09 MED ORDER — SENNA 8.6 MG PO TABS: 1.0000 | ORAL_TABLET | Freq: Every day | ORAL | Status: DC

## 2016-02-09 MED ORDER — DEXTROSE 5 % IV SOLN
2.0000 g | Freq: Every day | INTRAVENOUS | Status: DC
  Administered 2016-02-09 – 2016-02-10 (×3): 2 g via INTRAVENOUS
  Filled 2016-02-09 (×4): qty 2

## 2016-02-09 MED ORDER — SODIUM CHLORIDE 0.9% FLUSH
3.0000 mL | Freq: Two times a day (BID) | INTRAVENOUS | Status: DC
  Administered 2016-02-10 – 2016-02-11 (×2): 3 mL via INTRAVENOUS

## 2016-02-09 MED ORDER — ENOXAPARIN SODIUM 40 MG/0.4ML ~~LOC~~ SOLN
40.0000 mg | Freq: Every day | SUBCUTANEOUS | Status: DC
  Administered 2016-02-09 – 2016-02-11 (×3): 40 mg via SUBCUTANEOUS
  Filled 2016-02-09 (×3): qty 0.4

## 2016-02-09 MED ORDER — PRAVASTATIN SODIUM 40 MG PO TABS
40.0000 mg | ORAL_TABLET | Freq: Every day | ORAL | Status: DC
  Administered 2016-02-09 – 2016-02-11 (×3): 40 mg via ORAL
  Filled 2016-02-09 (×3): qty 1

## 2016-02-09 MED ORDER — ACETAMINOPHEN 650 MG RE SUPP: 650.0000 mg | Freq: Four times a day (QID) | RECTAL | Status: DC | PRN

## 2016-02-09 MED ORDER — ACETAMINOPHEN 325 MG PO TABS
650.0000 mg | ORAL_TABLET | Freq: Four times a day (QID) | ORAL | Status: DC | PRN
  Administered 2016-02-09 – 2016-02-10 (×3): 650 mg via ORAL
  Filled 2016-02-09 (×3): qty 2

## 2016-02-09 MED ORDER — IPRATROPIUM-ALBUTEROL 0.5-2.5 (3) MG/3ML IN SOLN: 3.0000 mL | RESPIRATORY_TRACT | Status: DC | PRN

## 2016-02-09 MED ORDER — BUDESONIDE 0.5 MG/2ML IN SUSP
0.5000 mg | Freq: Two times a day (BID) | RESPIRATORY_TRACT | Status: DC
  Administered 2016-02-09 – 2016-02-11 (×5): 0.5 mg via RESPIRATORY_TRACT
  Filled 2016-02-09 (×5): qty 2

## 2016-02-09 MED ORDER — SODIUM CHLORIDE 0.9 % IV SOLN
1000.0000 mL | INTRAVENOUS | Status: DC
  Administered 2016-02-09 – 2016-02-10 (×2): 1000 mL via INTRAVENOUS

## 2016-02-09 MED ORDER — BECLOMETHASONE DIPROPIONATE 80 MCG/ACT IN AERS
2.0000 | INHALATION_SPRAY | Freq: Two times a day (BID) | RESPIRATORY_TRACT | Status: DC
  Filled 2016-02-09: qty 8.7

## 2016-02-09 MED ORDER — HYPROMELLOSE (GONIOSCOPIC) 2.5 % OP SOLN: 1.0000 [drp] | Freq: Four times a day (QID) | OPHTHALMIC | Status: DC | PRN

## 2016-02-09 MED ORDER — POLYETHYLENE GLYCOL 3350 17 G PO PACK
17.0000 g | PACK | Freq: Every day | ORAL | Status: DC
  Administered 2016-02-10 – 2016-02-11 (×2): 17 g via ORAL
  Filled 2016-02-09 (×2): qty 1

## 2016-02-09 MED ORDER — ESCITALOPRAM OXALATE 10 MG PO TABS
10.0000 mg | ORAL_TABLET | Freq: Every day | ORAL | Status: DC
  Administered 2016-02-09 – 2016-02-11 (×3): 10 mg via ORAL
  Filled 2016-02-09 (×3): qty 1

## 2016-02-09 NOTE — ED Notes (Signed)
Report called to rn on the floor 

## 2016-02-09 NOTE — ED Notes (Signed)
I came out of a 4 hoour trauma to find this pt htat has not had anything done iv fluids antibiotics he has a room assigned  Upstairs admitting doctors aware that the pt has not had fluids etc

## 2016-02-09 NOTE — ED Notes (Signed)
1 IV in place due to difficult IV start.

## 2016-02-09 NOTE — ED Notes (Signed)
pts rt forearm iv has infiltrated  discd  Iv team notified

## 2016-02-09 NOTE — ED Notes (Signed)
Lab called  Back they cannot  Use the urine in lab too old another has to be collected and sent  Admitting doctor has been notified

## 2016-02-09 NOTE — ED Notes (Signed)
Urine sent for gm stain

## 2016-02-09 NOTE — ED Notes (Signed)
Iv team here 

## 2016-02-09 NOTE — H&P (Signed)
Date: 02/09/2016               Patient Name:  Luis Johnson MRN: [id number]  DOB: 11-Feb-1956 Age / Sex: 60 y.o., male   PCP: Marva PandaKimberly Millsaps, NP         Medical Service: Internal Medicine Teaching Service         Attending Physician: Dr. Levert FeinsteinJames M Granfortuna, MD    First Contact: Dr. Nyra MarketGorica Svalina, MD Pager: [phone]  Second Contact: Dr. Deneise LeverParth Saraiya, MD Pager: [phone]       After Hours (After 5p/  First Contact Pager: [phone]  weekends / holidays): Second Contact Pager: [phone]   Chief Complaint: fevers, fatigue.   History of Present Illness:  This is a 60 y/o M with Mhx significant for elevated PSA, HTN, COPD and CVA who presents for evaluation of fever and fatigue. Pt reports feeling lethargic and "run down" with a fever of 105* tonight at home after recent prostate biopsy Friday for an elevated PSA. Pt denied any urinary symptoms until after the procedure and now complains of nocturia and frequent urination however is without dysuria. Admits HA, dizziness and constipation since the procedure. Denies any abdominal pain, pelvic pain, dysuria, persistent hematuria, back pain or weight loss.   In the ED he was febrile to 102.6*, pulse 140, BP 109/81, respirations 16 and was saturating well on RA. CMET significant for AKI (Cr 1.45, normal baseline), and transaminitis (AST 100, ALT 104, normal baselines). CBC significant for leukocytosis of 13.5 with a neutrophilic predominance. Procalcitonin 45. Lactic acid 1.1. UA mildly suggestive of infection (Few bacteria, moderate leukocytes, 6-30 WBC's and negative nitrite). CXR clear. He was subsequently given IV Vancomycin and Zosyn and admitted for evaluation and treatment of sepsis.   Meds:  Current Meds  Medication Sig  . acetaminophen (TYLENOL) 325 MG tablet Take 1,000 mg by mouth 3 (three) times daily as needed for mild pain.   Marland Kitchen. albuterol-ipratropium (COMBIVENT) 18-103 MCG/ACT inhaler Inhale 1 puff into the lungs every 4 (four)  hours as needed for wheezing or shortness of breath.  Marland Kitchen. amLODipine (NORVASC) 10 MG tablet Take 10 mg by mouth daily.  . diphenhydrAMINE (BENADRYL) 25 MG tablet Take 50 mg by mouth every 6 (six) hours as needed for itching or allergies.   Marland Kitchen. EPINEPHrine 0.3 mg/0.3 mL IJ SOAJ injection Inject 0.3 mg into the muscle daily as needed (anaphylaxis).   Marland Kitchen. escitalopram (LEXAPRO) 10 MG tablet Take 10 mg by mouth daily.  . hydroxypropyl methylcellulose / hypromellose (ISOPTO TEARS / GONIOVISC) 2.5 % ophthalmic solution Place 1 drop into both eyes 4 (four) times daily as needed for dry eyes.  . methocarbamol (ROBAXIN) 500 MG tablet Take 500 mg by mouth 2 (two) times daily as needed for muscle spasms.  . pravastatin (PRAVACHOL) 40 MG tablet Take 40 mg by mouth daily.  Marland Kitchen. QVAR 80 MCG/ACT inhaler Take 2 puffs daily as needed for shortness of breath  . traZODone (DESYREL) 100 MG tablet Take 100 mg by mouth at bedtime as needed for sleep.   Allergies: Allergies as of 02/08/2016 - Review Complete 02/08/2016  Allergen Reaction Noted  . Eggs or egg-derived products Swelling 01/21/2014   Past Medical History:  Diagnosis Date  . Hypertension   . Stroke [facility](HCC)    Family History:  Mother: DM, heart failure, seizures Sister: CVA, MI  Social History: Former smoker, smoked 2-3 pks/day however quit 20 years ago. Reports he doesn't do drink or do drugs anymore.  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 Lives at home with his wife of 47 years who appears supportive and involved in his medical care, they have 9 children together.   Review of Systems: A complete ROS was negative except as per HPI.   Physical Exam: Blood pressure 109/81, pulse (!) 140, temperature 102.6 F (39.2 C), temperature source Oral, resp. rate 16, weight 163 lb (73.9 kg), SpO2 98 %. General: Older african american male resting comfortably in bed. In no acute distress. Appears fatigued. Wife present at bedside.    HENT: Normocephalic, atraumatic. EOMI. PERRL. Oropharynx clear, mucous membranes dry.  Cardiovascular: Tachycardic otherwise regular. No murmur or rub appreciated.  Pulmonary: CTA BL. No wheezes, rales or rhonchi. Breathing unlabored.  Abdomen: Soft, non-tender and non-distended. No guarding or rigidity. +Bowel sounds Extremities: Without peripheral edema. No calf tenderness. No suspicious lesions.  Skin: Hot, dry. No cyanosis.  Neuro: Strength and sensation to light touch grossly intact. Moves all extremities equally. Normal speech.  Psych: Normal mood and affect. Answers all questions appropriately   EKG: Sinus Tachycardia, rate 143. Biatrial enlargement, RAD. Unchanged from prior except rate.  CXR: Pulmonary hyperinflation. No active cardiopulmonary disease.   Assessment & Plan by Problem: This is a 60 y/o M who presents with fevers (max 105) and lethargy after recent prostate biopsy who received Cipro before and after procedure. He denies dysuria however endorses frequent urination and nocturia since the procedure. Vitals & labs c/w sepsis, received IV Vanc and Zosyn in ED PTA.   Principal Problem:   Prostatitis, acute Active Problems:   UTI (urinary tract infection)   HTN (hypertension)   COPD (chronic obstructive pulmonary disease) (HCC)   AKI (acute kidney injury) (HCC)   Transaminitis  Sepsis, likely urinary source Febrile, tachycardic, leukocytosis with prostate/urinary tract the likely infectious source. Was given IV Vancomycin and Zosyn in ED per sepsis protocol however has been narrowed to IV Ceftriaxone.  -IVF, 2.5 L ns bolus followed by ns at 125 mL/hr -IV Ceftriaxone -Tylenol for fever -Blood cx -HIV and HepC Ab -F/u AM CBC shows improved leukocytosis of 11.1  Acute Prostatitis, after recent prostate instrumentation History and labs consistent with probable prostatitis. Procalcitonin 45. Received Cipro before and after procedure. IV Abx as above, will narrow further  based on Urine culture & sensitivity results -Urine cx+sens -Urine gram stain  AKI Cr 1.45 and GFR 59 with normal baselines. Likely secondary to above processes. Will rehydrate patient and continue to monitor.  -Morning CMET  Transaminitis Not present on CMET from 2016, Likely secondary to above process however patient endorses hx of alcohol, tobacco and drug abuse. Will monitor for improvement with treatment of infection. -F/u AM CMET shows improving LFTs (AST 69, ALT 82).    HTN -Controlled with Amlodipine 10 mg at home, held at this time.   History of CVA in 2016 without apparent residual deficit -Pravastatin 40 mg  COPD Denies any worsening symptoms. Continue Combivent and Qvar inhalers.   Constipation No BM since procedure on Friday.  -Senna.   Depression Continue Lexapro 10 mg and Trazodone 100mg  QHS PRN  IVF: 2.5L ns bolus + IVF @ 125 mL/hr Diet: HH DVT Prophylaxis: Lovenox Code Status: FULL CODE  Dispo: Admit patient to Inpatient with expected length of stay greater than 2 midnights.  SignedNoemi Chapel: Luisantonio Adinolfi, DO 02/09/2016, 12:39 AM  Pager: [phone]

## 2016-02-09 NOTE — Progress Notes (Signed)
   Subjective:  Patient states he is feeling a lot better today; he does still have some chills. He denies chest pain, shortness of breath, abdominal pain, nausea or vomiting. He states that before hospitalization he had not had a BM in 3 days, but once he received antibiotics on admission this was resolved. He has no further concerns today.   Objective:  Vital signs in last 24 hours: Vitals:   02/09/16 0145 02/09/16 0239 02/09/16 1000 02/09/16 1414  BP: 124/86 140/85 128/72 122/72  Pulse: 103 (!) 115 (!) 114 87  Resp: 17 17 18    Temp:  99.7 F (37.6 C) 99.8 F (37.7 C) 98.7 F (37.1 C)  TempSrc:  Oral Oral Oral  SpO2: 100% 100% 100% 100%  Weight:  73.9 kg (162 lb 14.7 oz)     Constitutional: NAD, pleasant ENT: no lymphadenopathy, no oropharyngeal exudates or erythema CV: tachycardic, regular rhythm, no murmurs, rubs or gallops appreciated Resp: CTAB, no increased work of breathing, no crackles or wheezing appreciated Abd: soft, +BS, NTND MSK: moves all extremities freely  Assessment/Plan:  Principal Problem:   Prostatitis, acute Active Problems:   UTI (urinary tract infection)   HTN (hypertension)   COPD (chronic obstructive pulmonary disease) (HCC)   AKI (acute kidney injury) (HCC)   Transaminitis   Sepsis (HCC)  Sepsis, likely urinary source Resolved. Afebrile this AM, normotensive, tachycardic this AM but now has normal heart rate. Improvement of leukocytosis to 11.1. Will continue to monitor. --NS at 125 mL/hr --IV Ceftriaxone --f/u Blood cx - NGTD <12hrs  Acute Prostatitis, after recent prostate instrumentation History and labs consistent with probable prostatitis. Procalcitonin 45. Received Cipro before and after procedure. Gram stain shows gram negative rods and PMNs --f/u Urine cx+sens --IV Ceftriaxone  AKI Creatinine improved to 1.2 this AM with IVF. Will continue to monitor.  Transaminitis Improvement this AM: AST 69, ALT 82. HIV negative, Hep C  pending --f/u CMP in am --f/u Hep C  HTN Controlled with Amlodipine 10 mg at home --held at this time as normotensive; will continue to monitor for reintroduction   History of CVA in 2016 without apparent residual deficit --Pravastatin 40 mg  COPD --continue Combivent and Qvar inhalers.   Constipation Resolved  Depression --continue Lexapro 10 mg and Trazodone 100mg  QHS PRN  Dispo: Anticipated discharge in approximately 2 day(s).   Luis MarketGorica Arvind Mexicano, MD 02/09/2016, 4:41 PM Pager [phone]

## 2016-02-10 DIAGNOSIS — K5900 Constipation, unspecified: Secondary | ICD-10-CM

## 2016-02-10 DIAGNOSIS — F329 Major depressive disorder, single episode, unspecified: Secondary | ICD-10-CM

## 2016-02-10 DIAGNOSIS — J449 Chronic obstructive pulmonary disease, unspecified: Secondary | ICD-10-CM

## 2016-02-10 DIAGNOSIS — Z9889 Other specified postprocedural states: Secondary | ICD-10-CM

## 2016-02-10 DIAGNOSIS — B9689 Other specified bacterial agents as the cause of diseases classified elsewhere: Secondary | ICD-10-CM

## 2016-02-10 DIAGNOSIS — Z8673 Personal history of transient ischemic attack (TIA), and cerebral infarction without residual deficits: Secondary | ICD-10-CM

## 2016-02-10 DIAGNOSIS — Z79899 Other long term (current) drug therapy: Secondary | ICD-10-CM

## 2016-02-10 DIAGNOSIS — N179 Acute kidney failure, unspecified: Secondary | ICD-10-CM

## 2016-02-10 DIAGNOSIS — I10 Essential (primary) hypertension: Secondary | ICD-10-CM

## 2016-02-10 LAB — COMPREHENSIVE METABOLIC PANEL
ALBUMIN: 2.8 g/dL — AB (ref 3.5–5.0)
ALK PHOS: 62 U/L (ref 38–126)
ALT: 61 U/L (ref 17–63)
ANION GAP: 7 (ref 5–15)
AST: 39 U/L (ref 15–41)
BILIRUBIN TOTAL: 0.5 mg/dL (ref 0.3–1.2)
BUN: 7 mg/dL (ref 6–20)
CALCIUM: 8.6 mg/dL — AB (ref 8.9–10.3)
CO2: 22 mmol/L (ref 22–32)
Chloride: 108 mmol/L (ref 101–111)
Creatinine, Ser: 0.81 mg/dL (ref 0.61–1.24)
GFR calc non Af Amer: >60 mL/min (ref >60)
GLUCOSE: 99 mg/dL (ref 65–99)
POTASSIUM: 3.8 mmol/L (ref 3.5–5.1)
SODIUM: 137 mmol/L (ref 135–145)
TOTAL PROTEIN: 6 g/dL — AB (ref 6.5–8.1)

## 2016-02-10 LAB — CBC
HCT: 38.8 % — ABNORMAL LOW (ref 39.0–52.0)
HEMOGLOBIN: 12.7 g/dL — AB (ref 13.0–17.0)
MCH: 26.5 pg (ref 26.0–34.0)
MCHC: 32.7 g/dL (ref 30.0–36.0)
MCV: 80.8 fL (ref 78.0–100.0)
Platelets: 172 10*3/uL (ref 150–400)
RBC: 4.8 MIL/uL (ref 4.22–5.81)
RDW: 15.8 % — AB (ref 11.5–15.5)
WBC: 6.1 10*3/uL (ref 4.0–10.5)

## 2016-02-10 LAB — HEPATITIS C ANTIBODY

## 2016-02-10 NOTE — Progress Notes (Signed)
   Subjective:  Patient states his appetite has been down a little bit, but he has been able to eat without problem. Denies nausea, vomiting, diarrhea, or abdominal pain. Patient states he had BM yesterday that had a small streak of blood - told that was expected after his procedure earlier this week; he has not had further bowel movements or bleeding since then.   Objective:  Vital signs in last 24 hours: Vitals:   02/09/16 2228 02/10/16 0219 02/10/16 0509 02/10/16 0922  BP: 140/88  (!) 145/83 120/74  Pulse: 95  95 88  Resp: 18  18 18   Temp: 98.6 F (37 C)  99.2 F (37.3 C) 98.4 F (36.9 C)  TempSrc: Oral  Oral Oral  SpO2: 99%  100% 100%  Weight: 74.1 kg (163 lb 5.8 oz) 74.1 kg (163 lb 5.8 oz)     Constitutional: NAD, pleasant CV: RRR, no murmurs, rubs or gallops appreciated Resp: CTAB, no increased work of breathing, no crackles or wheezing appreciated Abd: soft, +BS, NTND MSK: moves all extremities freely  Assessment/Plan:  Principal Problem:   Prostatitis, acute Active Problems:   UTI (urinary tract infection)   HTN (hypertension)   COPD (chronic obstructive pulmonary disease) (HCC)   AKI (acute kidney injury) (HCC)   Transaminitis   Sepsis (HCC)  Sepsis, likely urinary source Resolved. No fevers, leukocytosis today. Reports good urine output. --NS at 125 mL/hr - discontinue --IV Ceftriaxone --f/u Blood cx - NGTD <12hrs  Acute Prostatitis, after recent prostate instrumentation History and labs consistent with probable prostatitis. Procalcitonin 45. Received Cipro before and after procedure. Gram stain shows gram negative rods and PMNs --f/u Urine cx+sens  --f/u blood cultures --IV Ceftriaxone - 3rd dose this evening; can likely switch to PO tomorrow pending cultures  AKI Creatinine improved to 0.81 this AM --d/c fluids  Transaminitis Resolved. HIV negative, Hep C negative --f/u CMP in am  HTN Controlled with Amlodipine 10 mg at home --held at this  time as normotensive; will continue to monitor for reintroduction   History of CVA in 2016 without apparent residual deficit --Pravastatin 40 mg  COPD --continue Combivent and Qvar inhalers.   Constipation --Miralax daily  Depression --continue Lexapro 10 mg  Dispo: Anticipated discharge in approximately 1-2 day(s).   Nyra MarketGorica Lovette Merta, MD 02/10/2016, 10:54 AM Pager [phone]

## 2016-02-11 DIAGNOSIS — A419 Sepsis, unspecified organism: Principal | ICD-10-CM

## 2016-02-11 DIAGNOSIS — N410 Acute prostatitis: Secondary | ICD-10-CM

## 2016-02-11 DIAGNOSIS — B9620 Unspecified Escherichia coli [E. coli] as the cause of diseases classified elsewhere: Secondary | ICD-10-CM

## 2016-02-11 LAB — COMPREHENSIVE METABOLIC PANEL
ALT: 55 U/L (ref 17–63)
AST: 35 U/L (ref 15–41)
Albumin: 2.7 g/dL — ABNORMAL LOW (ref 3.5–5.0)
Alkaline Phosphatase: 64 U/L (ref 38–126)
Anion gap: 7 (ref 5–15)
BUN: 7 mg/dL (ref 6–20)
CHLORIDE: 107 mmol/L (ref 101–111)
CO2: 26 mmol/L (ref 22–32)
Calcium: 8.9 mg/dL (ref 8.9–10.3)
Creatinine, Ser: 0.84 mg/dL (ref 0.61–1.24)
Glucose, Bld: 110 mg/dL — ABNORMAL HIGH (ref 65–99)
POTASSIUM: 4 mmol/L (ref 3.5–5.1)
SODIUM: 140 mmol/L (ref 135–145)
Total Bilirubin: 0.3 mg/dL (ref 0.3–1.2)
Total Protein: 6.3 g/dL — ABNORMAL LOW (ref 6.5–8.1)

## 2016-02-11 LAB — URINE CULTURE: Culture: 50000 — AB

## 2016-02-11 LAB — CBC
HEMATOCRIT: 36.6 % — AB (ref 39.0–52.0)
Hemoglobin: 12.1 g/dL — ABNORMAL LOW (ref 13.0–17.0)
MCH: 26.4 pg (ref 26.0–34.0)
MCHC: 33.1 g/dL (ref 30.0–36.0)
MCV: 79.7 fL (ref 78.0–100.0)
Platelets: 221 10*3/uL (ref 150–400)
RBC: 4.59 MIL/uL (ref 4.22–5.81)
RDW: 15.3 % (ref 11.5–15.5)
WBC: 4.3 10*3/uL (ref 4.0–10.5)

## 2016-02-11 MED ORDER — SENNOSIDES-DOCUSATE SODIUM 8.6-50 MG PO TABS: 2.0000 | ORAL_TABLET | Freq: Once | ORAL | Status: DC

## 2016-02-11 MED ORDER — CEPHALEXIN 500 MG PO CAPS: 500.0000 mg | ORAL_CAPSULE | Freq: Four times a day (QID) | ORAL | 0 refills | Status: DC

## 2016-02-11 MED ORDER — MAGNESIUM CITRATE PO SOLN
1.0000 | Freq: Once | ORAL | Status: AC
  Administered 2016-02-11: 1 via ORAL
  Filled 2016-02-11: qty 296

## 2016-02-11 NOTE — Progress Notes (Signed)
Discharge instructions and medications discussed with patient.  All questions answered.  

## 2016-02-11 NOTE — Progress Notes (Signed)
   Subjective:  Patient is feeling better today. He had been walking in hallway and states that it was good to get out of bed. He denies further BM's or blood per rectum. He denies dysuria, hematuria or suprapubic pain. Patient is in agreement with discharge today [facility]   Objective:  Vital signs in last 24 hours: Vitals:   02/10/16 2042 02/10/16 2309 02/11/16 0512 02/11/16 1020  BP:  125/81 137/86 131/87  Pulse:  94 78 95  Resp:  18 16 17   Temp:  99.7 F (37.6 C) 99 F (37.2 C) 98.7 F (37.1 C)  TempSrc:  Oral Oral Oral  SpO2: 97% 99% 97% 97%  Weight:  72.8 kg (160 lb 8 oz)     Constitutional: NAD, pleasant CV: RRR, no murmurs, rubs or gallops appreciated Resp: CTAB, no increased work of breathing, no crackles or wheezing appreciated Abd: soft, decreased BS, NTND MSK: moves all extremities freely  Assessment/Plan:  Principal Problem:   Prostatitis, acute Active Problems:   UTI (urinary tract infection)   HTN (hypertension)   COPD (chronic obstructive pulmonary disease) (HCC)   AKI (acute kidney injury) (HCC)   Transaminitis   Sepsis (HCC)  Acute Prostatitis, after recent prostate instrumentation History and labs consistent with probable prostatitis. Procalcitonin 45. Received Cipro before and after procedure. Gram stain shows gram negative rods and PMNs --f/u Urine cx+sens - E coli resistant to cipro and ampicillin --f/u blood cultures - NGTD x 2 days --Switching to PO Keflex - will continue for 2 weeks  AKI Resolved  Transaminitis Resolved. HIV negative, Hep C negative  HTN Controlled with Amlodipine 10 mg at home --resume at discharge  History of CVA in 2016 without apparent residual deficit --Pravastatin 40 mg  COPD --continue Combivent and Qvar inhalers.   Constipation --Miralax daily  Depression --continue Lexapro 10 mg  Constipation: Patient with mild abdominal distention, hypoactive bowel sounds and no  bowel movement x 2 days despite Miralax.  --Mag citrate  Dispo: Anticipated discharge today.   Nyra MarketGorica Laurita Peron, MD 02/11/2016, 1:16 PM Pager [phone]

## 2016-02-11 NOTE — Discharge Summary (Signed)
Medicine attending discharge note: I personally examined this patient on the day of discharge and I attest to the accuracy of the discharge evaluation and plan which will be subsequently recorded in the note by resident physician Dr. 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 He has been recently under evaluation for an elevated PSA at the [facility]. He underwent transrectal biopsy on November 24. He was given. Peri-procedure antibiotic prophylaxis with ciprofloxacin. He presents now 2 days later with sudden onset of fever to 105, lethargy, and chills. He has had nocturia and urinary frequency without dysuria since the procedure. On arrival in the emergency department temperature was 102.6, pulse 140, blood pressure 109/81, respirations 16. No cardiac murmurs. Clear lungs. Alert and oriented without focal deficits. Lactic acid 1.1, pro calcitonin 45, white count 13,500 with 90% neutrophils, BUN 15, creatinine 1.45 with baseline creatinine 0.9 on 11/13/2014.  Hospital course: Cultures were obtained and he was given saline fluid resuscitation and started on ceftriaxone. He improved rapidly. Fevers resolved. White count normalized and was 4300 at discharge. Renal function normalized with BUN 7 and creatinine 0.8 at discharge. Routine screening for hepatitis C and HIV were negative. Blood cultures were negative at 72 hours. Urine grew Escherichia coli resistant to ampicillin and Cipro but sensitive to cephalosporins and trimethoprim sulfa. He did develop constipation poorly responsive to laxatives and a mild ileus. No obstructive symptoms. Hospital course otherwise uncomplicated.  Disposition: Condition stable at time of discharge He was transitioned to oral Keflex. He will continue antibiotics to complete a 14 day  course. Follow-up with his urologist. I believe his [facility] physicians are at the [facility].

## 2016-02-11 NOTE — Discharge Summary (Signed)
Name: Luis Johnson MRN: [id number] DOB: 1955-07-12 60 y.o. PCP: Marva PandaKimberly Millsaps, NP  Date of Admission: 02/08/2016 10:30 PM Date of Discharge: 02/11/2016 Attending Physician: Levert FeinsteinJames M Granfortuna, MD  Discharge Diagnosis: Sepsis 2/2 prostatitis Principal Problem:   Prostatitis, acute Active Problems:   UTI (urinary tract infection)   HTN (hypertension)   COPD (chronic obstructive pulmonary disease) (HCC)   AKI (acute kidney injury) (HCC)   Transaminitis   Sepsis (HCC)   Discharge Medications:   Medication List    TAKE these medications   acetaminophen 325 MG tablet Commonly known as:  TYLENOL Take 1,000 mg by mouth 3 (three) times daily as needed for mild pain.   albuterol-ipratropium 18-103 MCG/ACT inhaler Commonly known as:  COMBIVENT Inhale 1 puff into the lungs every 4 (four) hours as needed for wheezing or shortness of breath.   amLODipine 10 MG tablet Commonly known as:  NORVASC Take 10 mg by mouth daily.   BENADRYL 25 MG tablet Generic drug:  diphenhydrAMINE Take 50 mg by mouth every 6 (six) hours as needed for itching or allergies.   cephALEXin 500 MG capsule Commonly known as:  KEFLEX Take 1 capsule (500 mg total) by mouth 4 (four) times daily.   EPINEPHrine 0.3 mg/0.3 mL Soaj injection Commonly known as:  EPI-PEN Inject 0.3 mg into the muscle daily as needed (anaphylaxis).   escitalopram 10 MG tablet Commonly known as:  LEXAPRO Take 10 mg by mouth daily.   hydroxypropyl methylcellulose / hypromellose 2.5 % ophthalmic solution Commonly known as:  ISOPTO TEARS / GONIOVISC Place 1 drop into both eyes 4 (four) times daily as needed for dry eyes.   methocarbamol 500 MG tablet Commonly known as:  ROBAXIN Take 500 mg by mouth 2 (two) times daily as needed for muscle spasms.   pravastatin 40 MG tablet Commonly known as:  PRAVACHOL Take 40 mg by mouth daily.   QVAR 80 MCG/ACT inhaler Generic drug:  beclomethasone Take 2 puffs daily as needed  for shortness of breath   traZODone 100 MG tablet Commonly known as:  DESYREL Take 100 mg by mouth at bedtime as needed for sleep.       Disposition and follow-up:   [name] [facility].  At the hospital follow up visit please address:  1.   --Has he been able to complete his antibiotic course - Keflex 500mg  q6hrs x 14 days with end date Dec 10th --Has he follow up with urologist? --Any further fevers, chills? --Any urinary symptoms? Any blood in stool?  2.  Labs / imaging needed at time of follow-up: CMP - check LFT's for no recurrent transaminitis  3.  Pending labs/ test needing follow-up: none  Follow-up Appointments: Follow-up Information    Primary Care Provider. Schedule an appointment as soon as possible for a visit in 2 week(s).   Why:  Please call and make an appointment with your primary care provider in 2-3 weeks.       Urologist. Schedule an appointment as soon as possible for a visit in 1 week(s).   Why:  Please make an appointment to follow up in 1-2 weeks.          Hospital Course by problem list: Principal Problem:   Prostatitis, acute Active Problems:   UTI (urinary tract infection)   HTN (hypertension)   COPD (chronic obstructive pulmonary disease) (HCC)   AKI (acute kidney injury) (HCC)   Transaminitis   Sepsis (HCC)  Sepsis: Patient with recent transrectal prostate [facility] with pre-and post-procedure prophylaxis with ciprofloxacin [facility] sudden onset of fevers to 105F at home, lethargy and chills. He has had concurrent symptoms of nocturia and urinary frequency since the procedure but has denied dysuria and hematuria. On arrival, patient was febrile, tachycardic, had leukocytosis with left shift, AKI and UA suspicious for infection so code sepsis was called and he received IVF resuscitation, and was started on  ceftriaxone. Patient responded well to above interventions. Blood cultures were negative x5 days, Urine cultures were positive for E coli that was resistant to ciprofloxacin. He was transitioned to oral keflex treatment x 14 days for presumed prostatitis.  Prostatitis 2/2 instrumentation: Patient with transurethral biopsy of prostate 2 days prior to presentation with pre and post procedure prophylaxis with ciprofloxacin. UA on presentation was suspicious for infection and eventual cultures grew E coli that was resistant to ciprofloxacin. Patient was started on IV ceftriaxone on admission and transitioned to oral keflex 500mg  q6hrs for 2 weeks. He was instructed to follow up [facility]; will leave up to discretion of urologist if need for continued antibiotics.   AKI: Patient with baseline creatine of 0.9 with increase at admission to 1.45 likely secondary to sepsis. After fluid resuscitation, his creatinine normalized.   Transaminitis: Patient with new finding of transaminitis (AST 100, ALT 104) on admission that improve as sepsis was addressed and normalized on discharge. Patient did endorse past alcohol use but denied illicit drug use. HIV and Hep C were checked and returned normal. Etiology is likely sepsis.   Hypertension: Patient on home regimen on amodipine 10mg  daily; this was held initially in setting of sepsis but was restarted later in the hospitalization.   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  COPD: Patient was continued on his home Combivent and Qvar inhalers.  Depression: Patient was continued on his home lexapro 10mg  daily and trazodone 100mg  qhs.   Discharge Vitals:   BP (!) 142/92 (BP Location: Right Arm)   Pulse 74   Temp 99.2 F (37.3 C) (Oral)   Resp 18   Wt 160 lb 8 oz (72.8 kg)   SpO2 99%   BMI 25.14 kg/m   Pertinent Labs, Studies, and Procedures:  CBC Latest Ref  Rng & Units 02/11/2016 02/10/2016 02/09/2016  WBC 4.0 - 10.5 K/uL 4.3 6.1 11.1(H)  Hemoglobin 13.0 - 17.0 g/dL 12.1(L) 12.7(L) 12.3(L)  Hematocrit 39.0 - 52.0 % 36.6(L) 38.8(L) 37.4(L)  Platelets 150 - 400 K/uL 221 172 192   CMP Latest Ref Rng & Units 02/11/2016 02/10/2016 02/09/2016  Glucose 65 - 99 mg/dL 409(W110(H) 99 119(J118(H)  BUN 6 - 20 mg/dL 7 7 13   Creatinine 0.61 - 1.24 mg/dL 4.780.84 2.950.81 6.211.20  Sodium 135 - 145 mmol/L 140 137 137  Potassium 3.5 - 5.1 mmol/L 4.0 3.8 3.5  Chloride 101 - 111 mmol/L 107 108 108  CO2 22 - 32 mmol/L 26 22 22   Calcium 8.9 - 10.3 mg/dL 8.9 3.0(Q8.6(L) 8.1(L)  Total Protein 6.5 - 8.1 g/dL 6.3(L) 6.0(L) 6.3(L)  Total Bilirubin 0.3 - 1.2 mg/dL 0.3 0.5 1.0  Alkaline Phos 38 - 126 U/L 64 62 59  AST 15 - 41 U/L 35 39 69(H)  ALT 17 - 63 U/L 55 61 82(H)   Hepatitis C ab 02/09/2016: nonreactive HIV ab  02/09/2016: nonreactive Urine gram stain 02/09/2016: Gram - rods and WBCs Urine culture 02/08/2016: E coli - resistant to ciprofloxacin Blood cultures x 2 02/08/2016: No growth x5 days - final  Discharge Instructions: Discharge Instructions    Call MD for:  difficulty breathing, headache or visual disturbances    Complete by:  As directed    Call MD for:  persistant dizziness or light-headedness    Complete by:  As directed    Call MD for:  persistant nausea and vomiting    Complete by:  As directed    Call MD for:  severe uncontrolled pain    Complete by:  As directed    Call MD for:  temperature >100.4    Complete by:  As directed    Diet - low sodium heart healthy    Complete by:  As directed    Discharge instructions    Complete by:  As directed    Please take your antibiotic four times a day until you complete it. Follow up with your urologist in the next 1-2 weeks - they will determine if they want to keep you on antibiotics for a longer period of time.   Increase activity slowly    Complete by:  As directed       Signed: Nyra Market, MD 02/11/2016,  5:39 PM   Pager [phone]

## 2016-02-13 LAB — CULTURE, BLOOD (ROUTINE X 2)
CULTURE: NO GROWTH
Culture: NO GROWTH

## 2016-09-24 ENCOUNTER — Ambulatory Visit: Payer: Managed Care, Other (non HMO) | Admitting: Nurse Practitioner

## 2016-09-28 ENCOUNTER — Encounter: Payer: Self-pay | Admitting: Nurse Practitioner

## 2017-05-31 ENCOUNTER — Emergency Department (HOSPITAL_COMMUNITY): Payer: Medicare Other

## 2017-05-31 ENCOUNTER — Inpatient Hospital Stay (HOSPITAL_COMMUNITY)
Admission: EM | Admit: 2017-05-31 | Discharge: 2017-06-03 | DRG: 040 | Disposition: A | Discharge status: Home / Self Care | Payer: Medicare Other | Attending: Family Medicine | Admitting: Family Medicine

## 2017-05-31 ENCOUNTER — Encounter (HOSPITAL_COMMUNITY): Payer: Self-pay

## 2017-05-31 ENCOUNTER — Other Ambulatory Visit: Payer: Self-pay

## 2017-05-31 DIAGNOSIS — Z8249 Family history of ischemic heart disease and other diseases of the circulatory system: Secondary | ICD-10-CM

## 2017-05-31 DIAGNOSIS — I7389 Other specified peripheral vascular diseases: Secondary | ICD-10-CM | POA: Diagnosis present

## 2017-05-31 DIAGNOSIS — R29704 NIHSS score 4: Secondary | ICD-10-CM | POA: Diagnosis present

## 2017-05-31 DIAGNOSIS — I6602 Occlusion and stenosis of left middle cerebral artery: Secondary | ICD-10-CM | POA: Diagnosis present

## 2017-05-31 DIAGNOSIS — I671 Cerebral aneurysm, nonruptured: Secondary | ICD-10-CM | POA: Diagnosis present

## 2017-05-31 DIAGNOSIS — I651 Occlusion and stenosis of basilar artery: Secondary | ICD-10-CM | POA: Diagnosis not present

## 2017-05-31 DIAGNOSIS — M316 Other giant cell arteritis: Secondary | ICD-10-CM | POA: Diagnosis present

## 2017-05-31 DIAGNOSIS — Z91012 Allergy to eggs: Secondary | ICD-10-CM

## 2017-05-31 DIAGNOSIS — I608 Other nontraumatic subarachnoid hemorrhage: Principal | ICD-10-CM | POA: Diagnosis present

## 2017-05-31 DIAGNOSIS — N179 Acute kidney failure, unspecified: Secondary | ICD-10-CM | POA: Diagnosis not present

## 2017-05-31 DIAGNOSIS — H543 Unqualified visual loss, both eyes: Secondary | ICD-10-CM | POA: Diagnosis present

## 2017-05-31 DIAGNOSIS — I6783 Posterior reversible encephalopathy syndrome: Secondary | ICD-10-CM | POA: Diagnosis present

## 2017-05-31 DIAGNOSIS — F329 Major depressive disorder, single episode, unspecified: Secondary | ICD-10-CM | POA: Diagnosis present

## 2017-05-31 DIAGNOSIS — I619 Nontraumatic intracerebral hemorrhage, unspecified: Secondary | ICD-10-CM | POA: Diagnosis present

## 2017-05-31 DIAGNOSIS — H547 Unspecified visual loss: Secondary | ICD-10-CM

## 2017-05-31 DIAGNOSIS — E785 Hyperlipidemia, unspecified: Secondary | ICD-10-CM

## 2017-05-31 DIAGNOSIS — Z79899 Other long term (current) drug therapy: Secondary | ICD-10-CM

## 2017-05-31 DIAGNOSIS — I612 Nontraumatic intracerebral hemorrhage in hemisphere, unspecified: Secondary | ICD-10-CM

## 2017-05-31 DIAGNOSIS — R29810 Facial weakness: Secondary | ICD-10-CM | POA: Diagnosis not present

## 2017-05-31 DIAGNOSIS — I6622 Occlusion and stenosis of left posterior cerebral artery: Secondary | ICD-10-CM | POA: Diagnosis present

## 2017-05-31 DIAGNOSIS — I10 Essential (primary) hypertension: Secondary | ICD-10-CM

## 2017-05-31 DIAGNOSIS — F32A Depression, unspecified: Secondary | ICD-10-CM | POA: Diagnosis present

## 2017-05-31 DIAGNOSIS — G459 Transient cerebral ischemic attack, unspecified: Secondary | ICD-10-CM

## 2017-05-31 DIAGNOSIS — R9082 White matter disease, unspecified: Secondary | ICD-10-CM | POA: Diagnosis present

## 2017-05-31 DIAGNOSIS — Z7951 Long term (current) use of inhaled steroids: Secondary | ICD-10-CM | POA: Diagnosis not present

## 2017-05-31 DIAGNOSIS — I611 Nontraumatic intracerebral hemorrhage in hemisphere, cortical: Secondary | ICD-10-CM | POA: Diagnosis not present

## 2017-05-31 DIAGNOSIS — Z823 Family history of stroke: Secondary | ICD-10-CM | POA: Diagnosis not present

## 2017-05-31 DIAGNOSIS — I69254 Hemiplegia and hemiparesis following other nontraumatic intracranial hemorrhage affecting left non-dominant side: Secondary | ICD-10-CM

## 2017-05-31 DIAGNOSIS — S066X0A Traumatic subarachnoid hemorrhage without loss of consciousness, initial encounter: Secondary | ICD-10-CM | POA: Diagnosis not present

## 2017-05-31 DIAGNOSIS — R51 Headache: Secondary | ICD-10-CM | POA: Diagnosis not present

## 2017-05-31 DIAGNOSIS — J449 Chronic obstructive pulmonary disease, unspecified: Secondary | ICD-10-CM | POA: Diagnosis not present

## 2017-05-31 LAB — RAPID URINE DRUG SCREEN, HOSP PERFORMED
Amphetamines: NOT DETECTED
BARBITURATES: NOT DETECTED
BENZODIAZEPINES: NOT DETECTED
Cocaine: NOT DETECTED
Opiates: POSITIVE — AB
TETRAHYDROCANNABINOL: NOT DETECTED

## 2017-05-31 LAB — COMPREHENSIVE METABOLIC PANEL
ALT: 25 U/L (ref 17–63)
AST: 23 U/L (ref 15–41)
Albumin: 3.9 g/dL (ref 3.5–5.0)
Alkaline Phosphatase: 70 U/L (ref 38–126)
Anion gap: 9 (ref 5–15)
BUN: 10 mg/dL (ref 6–20)
CO2: 24 mmol/L (ref 22–32)
Calcium: 9.2 mg/dL (ref 8.9–10.3)
Chloride: 106 mmol/L (ref 101–111)
Creatinine, Ser: 0.8 mg/dL (ref 0.61–1.24)
GFR calc Af Amer: >60 mL/min (ref >60)
GFR calc non Af Amer: >60 mL/min (ref >60)
Glucose, Bld: 93 mg/dL (ref 65–99)
Potassium: 4 mmol/L (ref 3.5–5.1)
Sodium: 139 mmol/L (ref 135–145)
Total Bilirubin: 0.4 mg/dL (ref 0.3–1.2)
Total Protein: 8.1 g/dL (ref 6.5–8.1)

## 2017-05-31 LAB — I-STAT TROPONIN, ED: Troponin i, poc: 0 ng/mL (ref 0.00–0.08)

## 2017-05-31 LAB — I-STAT CHEM 8, ED
BUN: 13 mg/dL (ref 6–20)
Calcium, Ion: 1.15 mmol/L (ref 1.15–1.40)
Chloride: 105 mmol/L (ref 101–111)
Creatinine, Ser: 0.8 mg/dL (ref 0.61–1.24)
Glucose, Bld: 89 mg/dL (ref 65–99)
HCT: 49 % (ref 39.0–52.0)
Hemoglobin: 16.7 g/dL (ref 13.0–17.0)
Potassium: 3.9 mmol/L (ref 3.5–5.1)
Sodium: 142 mmol/L (ref 135–145)
TCO2: 25 mmol/L (ref 22–32)

## 2017-05-31 LAB — DIFFERENTIAL
Basophils Absolute: 0 10*3/uL (ref 0.0–0.1)
Basophils Relative: 0 %
Eosinophils Absolute: 0.1 10*3/uL (ref 0.0–0.7)
Eosinophils Relative: 1 %
Lymphocytes Relative: 18 %
Lymphs Abs: 0.9 10*3/uL (ref 0.7–4.0)
Monocytes Absolute: 0.3 10*3/uL (ref 0.1–1.0)
Monocytes Relative: 5 %
Neutro Abs: 4 10*3/uL (ref 1.7–7.7)
Neutrophils Relative %: 76 %

## 2017-05-31 LAB — CBG MONITORING, ED: Glucose-Capillary: 83 mg/dL (ref 65–99)

## 2017-05-31 LAB — CBC
HCT: 44.7 % (ref 39.0–52.0)
Hemoglobin: 14.8 g/dL (ref 13.0–17.0)
MCH: 27.2 pg (ref 26.0–34.0)
MCHC: 33.1 g/dL (ref 30.0–36.0)
MCV: 82.2 fL (ref 78.0–100.0)
Platelets: 270 10*3/uL (ref 150–400)
RBC: 5.44 MIL/uL (ref 4.22–5.81)
RDW: 14.9 % (ref 11.5–15.5)
WBC: 5.3 10*3/uL (ref 4.0–10.5)

## 2017-05-31 LAB — PROTIME-INR
INR: 0.94
Prothrombin Time: 12.4 seconds (ref 11.4–15.2)

## 2017-05-31 LAB — C-REACTIVE PROTEIN: CRP: <0.8 mg/dL (ref <1.0)

## 2017-05-31 LAB — SEDIMENTATION RATE: Sed Rate: 5 mm/hr (ref 0–16)

## 2017-05-31 LAB — TSH: TSH: 0.773 u[IU]/mL (ref 0.350–4.500)

## 2017-05-31 LAB — APTT: aPTT: 26 seconds (ref 24–36)

## 2017-05-31 MED ORDER — STROKE: EARLY STAGES OF RECOVERY BOOK
Freq: Once | Status: AC
  Administered 2017-05-31
  Filled 2017-05-31: qty 1

## 2017-05-31 MED ORDER — SENNOSIDES-DOCUSATE SODIUM 8.6-50 MG PO TABS: 1.0000 | ORAL_TABLET | Freq: Every evening | ORAL | Status: DC | PRN

## 2017-05-31 MED ORDER — BECLOMETHASONE DIPROPIONATE 80 MCG/ACT IN AERS: 2.0000 | INHALATION_SPRAY | Freq: Two times a day (BID) | RESPIRATORY_TRACT | Status: DC

## 2017-05-31 MED ORDER — ATORVASTATIN CALCIUM 80 MG PO TABS
80.0000 mg | ORAL_TABLET | Freq: Every day | ORAL | Status: DC
  Administered 2017-06-01 – 2017-06-02 (×2): 80 mg via ORAL
  Filled 2017-05-31 (×3): qty 1

## 2017-05-31 MED ORDER — IPRATROPIUM-ALBUTEROL 0.5-2.5 (3) MG/3ML IN SOLN: 3.0000 mL | RESPIRATORY_TRACT | Status: DC | PRN

## 2017-05-31 MED ORDER — EPINEPHRINE 0.3 MG/0.3ML IJ SOAJ: 0.3000 mg | Freq: Every day | INTRAMUSCULAR | Status: DC | PRN

## 2017-05-31 MED ORDER — AMLODIPINE BESYLATE 10 MG PO TABS
10.0000 mg | ORAL_TABLET | Freq: Every day | ORAL | Status: DC
  Administered 2017-06-01 – 2017-06-03 (×3): 10 mg via ORAL
  Filled 2017-05-31: qty 2
  Filled 2017-05-31 (×2): qty 1

## 2017-05-31 MED ORDER — ALBUTEROL SULFATE (2.5 MG/3ML) 0.083% IN NEBU: 2.5000 mg | INHALATION_SOLUTION | RESPIRATORY_TRACT | Status: DC | PRN

## 2017-05-31 MED ORDER — HYDRALAZINE HCL 20 MG/ML IJ SOLN
5.0000 mg | INTRAMUSCULAR | Status: DC | PRN
  Administered 2017-06-01 – 2017-06-02 (×2): 5 mg via INTRAVENOUS
  Filled 2017-05-31 (×2): qty 1

## 2017-05-31 MED ORDER — BUDESONIDE 0.25 MG/2ML IN SUSP
0.2500 mg | Freq: Two times a day (BID) | RESPIRATORY_TRACT | Status: DC
  Administered 2017-06-01 – 2017-06-03 (×5): 0.25 mg via RESPIRATORY_TRACT
  Filled 2017-05-31 (×5): qty 2

## 2017-05-31 MED ORDER — ACETAMINOPHEN 325 MG PO TABS
650.0000 mg | ORAL_TABLET | Freq: Three times a day (TID) | ORAL | Status: DC | PRN
  Administered 2017-05-31 – 2017-06-03 (×5): 650 mg via ORAL
  Filled 2017-05-31 (×5): qty 2

## 2017-05-31 MED ORDER — ESCITALOPRAM OXALATE 10 MG PO TABS
10.0000 mg | ORAL_TABLET | Freq: Every day | ORAL | Status: DC
  Administered 2017-05-31 – 2017-06-03 (×4): 10 mg via ORAL
  Filled 2017-05-31 (×4): qty 1

## 2017-05-31 NOTE — ED Notes (Signed)
Pt reports vision is starting to come back

## 2017-05-31 NOTE — ED Provider Notes (Signed)
Patient placed in Quick Look pathway, seen and evaluated   Chief Complaint: vision loss  HPI:   62 year old male with Johnson history CVA x3 presenting with bilateral vision loss that began approximately 15 minutes prior to arrival in the ED and associated severe, throbbing headache.  He states that he slowly feels as if his vision is coming back since onset, but reports that his vision is extremely blurry.   He states that he was admitted for one week at [facility] last month for Johnson hemorrhagic stroke.  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 He was last seen by neurology on 05/04/17 for PRES follow up after he had had Johnson total loss of vision in his left eye, followed by his right eye that is since returned to both eyes.  ROS: bilateral vision loss, headache   Physical Exam:   Gen: No distress  Neuro: Awake and Alert  Skin: Warm    Focused Exam: Patient is unable to discern the number of fingers that are being held up with his bilateral eyes, left, or light right eye individually.  Mild decreased sensation over the mandibular distribution of the trigeminal nerve on the left.  Cranial nerves are otherwise grossly intact.  4 out of 5 strength of the left upper extremity against resistance; 5 out of 5 strength of the right upper extremity.  Bilateral lower extremities are 5 out of 5 strength against resistance.  Sensation is intact peripherally.  The patient was seen and evaluated with Dr. Juleen ChinaKohut, attending physician.   Initiation of care has begun. The patient has been counseled on the process, plan, and necessity for staying for the completion/evaluation, and the remainder of the medical screening examination    Luis BoardsMcDonald, Luis Subramaniam A, PA-C 05/31/17 1815    Raeford RazorKohut, Stephen, MD 05/31/17 1933

## 2017-05-31 NOTE — ED Notes (Signed)
Admitting MD at bedside.

## 2017-05-31 NOTE — Progress Notes (Signed)
Admitted to 3W14, left leg weakness, c/o right eye pain, sharp.  Oriented to room, safety precautions, plan of care & meds.  Requested diet order prior to any meds or food, passes SSS in ED.

## 2017-05-31 NOTE — ED Notes (Signed)
Patient in MRI at this time. 

## 2017-05-31 NOTE — ED Notes (Signed)
Dr. Kohut at bedside 

## 2017-05-31 NOTE — ED Notes (Signed)
Patient brought from CT to trauma C, evaluated by Neurologist who wanted a STAT MRI, MRA.  Blood work will be collected on return to department after MRI.

## 2017-05-31 NOTE — ED Triage Notes (Signed)
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 Pt states today at 1730 he suddenly began experiencing headache, blindness in bilateral eyes, and stuttering. PT reports symptoms feel the same as prior stroke. PA at bedside in triage.

## 2017-05-31 NOTE — Consult Note (Signed)
Neurology Consultation  Reason for Consult: Headache, blindness Referring Physician: Dr. Wilson Singer  CC: Headache, followed by bilateral visual loss  History is obtained from: Patient, wife, chart review  HPI: Luis Johnson is a 62 y.o. male who has a past medical history of hypertension, ICH involving the left parieto-occipital and right parietal occipital area area in 2016 and a more recent [place] involving the medial aspect of the right parietal lobe and 2 other small areas of bleed in the head on April 28, 2017, presented to the emergency room for evaluation of headache and acute onset of bilateral visual loss that started 30 minutes prior to presentation.  He was normal this morning, has residual baseline left leg weakness, started noticing sudden vision loss and a headache involving the whole head 8/10 throbbing in nature that started around 5:30 PM.  His wife drove him to the hospital because his prior episodes of ICH and on review of charts also PRES were similar in presentation with headaches and visual loss. Has a long standing h/o headaches.  He was taken in for a stat CT of the head as a part of the code stroke protocol which showed a very small focus of acute subarachnoid blood in the right occipital lobe around the right parietal occipital sulcus along with severe chronic white matter disease and sequela of prior bilateral occipital hemorrhages.  LKW: 5:30 PM on 05/31/2017 tpa given?: no, multiple ICH with the most recent one being less than a month ago. Premorbid modified Rankin scale (mRS):2  ROS: ROS was performed and is negative except as noted in the HPI.   Past Medical History:  Diagnosis Date  . Hypertension   . Stroke [facility])     Family History  Problem Relation Age of Onset  . Seizures Mother   . Heart failure Mother   . Stroke Brother   . Heart attack Brother    Social History:   reports that he quit smoking about 23 years ago. he has never used smokeless tobacco. He  reports that he does not drink alcohol or use drugs.  Medications No current facility-administered medications for this encounter.   Current Outpatient Medications:  .  acetaminophen (TYLENOL) 325 MG tablet, Take 1,000 mg by mouth 3 (three) times daily as needed for mild pain. , Disp: , Rfl:  .  albuterol-ipratropium (COMBIVENT) 18-103 MCG/ACT inhaler, Inhale 1 puff into the lungs every 4 (four) hours as needed for wheezing or shortness of breath., Disp: , Rfl:  .  amLODipine (NORVASC) 10 MG tablet, Take 10 mg by mouth daily., Disp: , Rfl:  .  atorvastatin (LIPITOR) 80 MG tablet, Take 80 mg by mouth daily., Disp: , Rfl: 1 .  EPINEPHrine 0.3 mg/0.3 mL IJ SOAJ injection, Inject 0.3 mg into the muscle daily as needed (anaphylaxis). , Disp: , Rfl:  .  escitalopram (LEXAPRO) 10 MG tablet, Take 10 mg by mouth daily., Disp: , Rfl:  .  QVAR 80 MCG/ACT inhaler, INHALE 2 PUFFS TWICE DAILY, Disp: , Rfl: 0 .  cephALEXin (KEFLEX) 500 MG capsule, Take 1 capsule (500 mg total) by mouth 4 (four) times daily. (Patient not taking: Reported on 05/31/2017), Disp: 44 capsule, Rfl: 0  Exam: Current vital signs: BP (!) 162/108 (BP Location: Right Arm)   Pulse 94   Temp 98.5 F (36.9 C) (Oral)   Resp 16   SpO2 100%  Vital signs in last 24 hours: Temp:  [98.5 F (36.9 C)] 98.5 F (36.9 C) (03/19 1801) Pulse  Rate:  [94] 94 (03/19 1801) Resp:  [16] 16 (03/19 1801) BP: (162)/(108) 162/108 (03/19 1801) SpO2:  [100 %] 100 % (03/19 1801) GENERAL: Awake, alert in NAD HEENT: - Normocephalic and atraumatic, dry mm, no LN++, no Thyromegally LUNGS - Clear to auscultation bilaterally with no wheezes CV - S1S2 RRR, no m/r/g, equal pulses bilaterally. ABDOMEN - Soft, nontender, nondistended with normoactive BS Ext: warm, well perfused, intact peripheral pulses, no edema NEURO:  Mental Status: AA&Ox3  Language: speech is not dysarthric.  Naming, repetition, fluency, and comprehension intact. Cranial Nerves: PERRL  67m/brisk. EOMI, visual fields full although the visual acuity reduced to finger counting. He did close eyes tightly on shining light in the eyes. No facial asymmetry, facial sensation intact, hearing intact, tongue/uvula/soft palate midline, normal sternocleidomastoid and trapezius muscle strength. No evidence of tongue atrophy or fibrillations Motor: 5/5 RUE and RLE. 5/5 LUE. 4/5 LLE with vertical drift in the left leg with it hitting bed before 5 sec. Tone: is normal and bulk is normal Sensation- decreased on left compared to right head to toe Coordination: FTN intact bilaterally, no ataxia in BLE. Gait- deferred  NIHSS 1a Level of Conscious.: 0 1b LOC Questions: 0 1c LOC Commands: 0 2 Best Gaze: 0 3 Visual: 0 4 Facial Palsy: 0 5a Motor Arm - left: 0 5b Motor Arm - Right: 0 6a Motor Leg - Left: 2 6b Motor Leg - Right: 0 7 Limb Ataxia: 0 8 Sensory: 2 9 Best Language: 0 10 Dysarthria: 0 11 Extinct. and Inatten.: 0 TOTAL: 4  Labs I have reviewed labs in epic and the results pertinent to this consultation are: CBC    Component Value Date/Time   WBC 4.3 02/11/2016 0537   RBC 4.59 02/11/2016 0537   HGB 12.1 (L) 02/11/2016 0537   HCT 36.6 (L) 02/11/2016 0537   PLT 221 02/11/2016 0537   MCV 79.7 02/11/2016 0537   MCH 26.4 02/11/2016 0537   MCHC 33.1 02/11/2016 0537   RDW 15.3 02/11/2016 0537   LYMPHSABS 0.5 (L) 02/09/2016 0457   MONOABS 0.7 02/09/2016 0457   EOSABS 0.0 02/09/2016 0457   BASOSABS 0.0 02/09/2016 0457   CMP     Component Value Date/Time   NA 140 02/11/2016 0537   K 4.0 02/11/2016 0537   CL 107 02/11/2016 0537   CO2 26 02/11/2016 0537   GLUCOSE 110 (H) 02/11/2016 0537   BUN 7 02/11/2016 0537   CREATININE 0.84 02/11/2016 0537   CALCIUM 8.9 02/11/2016 0537   PROT 6.3 (L) 02/11/2016 0537   ALBUMIN 2.7 (L) 02/11/2016 0537   AST 35 02/11/2016 0537   ALT 55 02/11/2016 0537   ALKPHOS 64 02/11/2016 0537   BILITOT 0.3 02/11/2016 0537   GFRNONAA >60  02/11/2016 0537   GFRAA >60 02/11/2016 0537   Lipid Panel     Component Value Date/Time   CHOL 302 (H) 11/12/2014 0925   TRIG 151 (H) 11/12/2014 0925   HDL 41 11/12/2014 0925   CHOLHDL 7.4 11/12/2014 0925   VLDL 30 11/12/2014 0925   LDLCALC 231 (H) 11/12/2014 0925   Imaging I have reviewed the images obtained: CT-scan of the brain - old bleed in both occipital lobes, concerning small focus of ?SAH in right occipital lobe. MRI examination of the brain - multiple SWI susceptibility foci predominantly in the posterior regions bilaterally. No restricted diffusion. Progression of numerous microbleeds compared to prior MRI GRE images. Evidence of old bleeds in b/l occipital regions. Formal reading pending  Assessment: 61/M with PMH of multiple occipital ICH and recent right parietal ICH along with h/o PRES per chart review, presenting with headaches and vision loss in both eyes that is resolving. On exam he has some residual left sided weakness, and severely diminished visual acuity bilaterally along with some left sided sensory deficits. Not a candidate for tPA due to recent Cole. Based on image review, possible small SAH in rt occipital lobe - not sure true or artifact. MRI formal read pending but seems to have progression of microbleeds along with stigmata of old bleeds. Imaging is very concerning and differentials include Vasculitides vs. CAA. I would recommend admission for further evaluation by stroke team.  Impression: -Acute vision loss - h/o PRES - unclear if recurrent PRES or other differentials such as vasculitides and CAA or if the [facility] is real and he has had multiple ICHs - another consideration is RCVS. -H/O multiple ICHs  Recommendations: -Admit to hospitalist -Frequent neurochecks -PT OT ST -Tele -SBP<140  -Check ESR CRP TSH UDS -Check rheumatoid factor, c-ANCA, p-ANCA, ds-DNA, ANA, RPR, Hepatits B and Hepatits C testing  And HIV testing. -Might need a spinal tap for  diagnosis along with possible DSA as well. Biopsy might be a last step consideration if all else is unrevealing. -Await formal MRI read and might need more opinions from different neurorads re: other differentials -Stroke team to follow in AM.  -- Amie Portland, MD [facility] Pager: [phone] If 7pm to 7am, please call on call as listed on AMION.

## 2017-05-31 NOTE — ED Notes (Signed)
Per care everywhere [facility] that showed:  Focal parenchymal hemorrhage is present at the medial aspect of the right parietal lobe measuring 13 x 7 mm. 2 additional small suspected parenchymal hematoma is present at the posterior right parietal lobe with greatest dimension of 7 and 3 mm  respectively. Small amount of adjacent vasogenic edema at all 3 sites. Additional chronic ischemic changes within the periventricular white matter. Small amount of calcification within the bilateral basal ganglia. No evidence of hydrocephalus. Focal region of decreased density in the left posterior parietal lobe and adjacent left occipital lobe with some enlargement of the adjacent ventricular occipital horn. No intracranial mass, mass effect or midline shift. No acute infarction evident. Paranasal sinuses: Clear. Mastoid air cells: Clear. Calvarium: Intact.  SCAN DONE ON 04-28-17

## 2017-05-31 NOTE — H&P (Signed)
History and Physical    Luis Johnson WPY:[id number] DOB: 12/25/1955 DOA: 05/31/2017  Referring MD/NP/PA:   PCP: Everardo Beals, NP   Patient coming from:  The patient is coming from home.  At baseline, pt is independent for most of ADL.      Chief Complaint: vision loss  HPI: Luis Johnson is a 62 y.o. male with medical history significant of ICH, hypertension, hyperlipidemia, COPD, depression, PRES syndrome, who presents with vision loss.  Pt states that he had two times of ICH in the past, one in 2016 and second time 2//14/19. He states that he had bilateral vision loss due to [place], which has been improving, but not completely recovered yet. Per his wife, pt has bilateral vision loss again at about 17:30 today. He has had transient stuttering, which has largely resolved. His vision has improved, but still has blurry vision. Pt also reports mild left leg weakness, no hearing loss. Patient denies chest pain, shortness of breath, cough, fever or chills. No nausea, vomiting, diarrhea, abdominal pain, symptoms of UTI. No fever or chills. He had 8/10 of throbbing whole headache.  ED Course: pt was found to have WBC 5.3, INR 0.94, electrolytes renal function okay, no tachycardia, O2 sat 99% on room air, temperature normal. CT of the head showed a very small focus of acute subarachnoid blood in the right occipital lobe around the right parietal occipital sulcus along with severe chronic white matter disease and sequela of prior bilateral occipital hemorrhages. Pt is admitted to tele bed as inpt.  # MRI/MRA showed: 1. No acute infarct. 2. Redemonstration of small volume right occipital subarachnoid Hemorrhage.  3. Extensive bilateral posterior hemisphere hemosiderin deposition related to remote hemorrhagic events. 4. No emergent large vessel occlusion. No vascular abnormality that would explain the reported vision loss. 5. Severe chronic ischemic microangiopathy. 6. 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  Review of Systems:   General: no fevers, chills, no body weight gain, has fatigue HEENT: has blurry vision, no hearing changes or sore throat Respiratory: no dyspnea, coughing, wheezing CV: no chest pain, no palpitations GI: no nausea, vomiting, abdominal pain, diarrhea, constipation GU: no dysuria, burning on urination, increased urinary frequency, hematuria  Ext: no leg edema Neuro: has vision loss, stuttering and left leg weakness. Skin: no rash, no skin tear. MSK: No muscle spasm, no deformity, no limitation of range of movement in spin Heme: No easy bruising.  Travel history: No recent long distant travel.  Allergy:  Allergies  Allergen Reactions  . Eggs Or Egg-Derived Products Swelling    Past Medical History:  Diagnosis Date  . Hypertension   . Stroke [facility])     Past Surgical History:  Procedure Laterality Date  . SHOULDER SURGERY      Social History:  reports that he quit smoking about 23 years ago. he has never used smokeless tobacco. He reports that he does not drink alcohol or use drugs.  Family History:  Family History  Problem Relation Age of Onset  . Seizures Mother   . Heart failure Mother   . Stroke Brother   . Heart attack Brother      Prior to Admission medications   Medication Sig Start Date End Date Taking? Authorizing Provider  acetaminophen (TYLENOL) 325 MG tablet Take 1,000 mg by mouth 3 (three) times daily as needed for mild pain.    Yes [provider]  albuterol-ipratropium (COMBIVENT) 18-103 MCG/ACT inhaler Inhale 1 puff into the lungs every 4 (  four) hours as needed for wheezing or shortness of breath.   Yes [provider]  amLODipine (NORVASC) 10 MG tablet Take 10 mg by mouth daily.   Yes [provider]  atorvastatin (LIPITOR) 80 MG tablet Take 80 mg by mouth daily. 05/04/17  Yes [provider]  EPINEPHrine 0.3 mg/0.3 mL IJ SOAJ injection Inject 0.3 mg into the  muscle daily as needed (anaphylaxis).    Yes [provider]  escitalopram (LEXAPRO) 10 MG tablet Take 10 mg by mouth daily.   Yes [provider]  QVAR 80 MCG/ACT inhaler INHALE 2 PUFFS TWICE DAILY 01/08/15  Yes [provider]  cephALEXin (KEFLEX) 500 MG capsule Take 1 capsule (500 mg total) by mouth 4 (four) times daily. Patient not taking: Reported on 05/31/2017 02/11/16   Milagros Loll, MD    Physical Exam: Vitals:   05/31/17 2300 06/01/17 0000 06/01/17 0200 06/01/17 0400  BP: (!) 150/87 (!) 143/84 105/68 117/76  Pulse: 69 64 66 67  Resp: _0 Temp: 98.4 F (36.9 C) 98.4 F (36.9 C) 98.6 F (37 C) 98.6 F (37 C)  TempSrc: Oral Oral Oral Oral  SpO2: 98% 99% 97% 99%  Weight: 73.2 kg (161 lb 6 oz)     Height: _1  (1.702 m)      General: Not in acute distress HEENT:       Eyes: PERRL, EOMI, no scleral icterus.       ENT: No discharge from the ears and nose, no pharynx injection, no tonsillar enlargement.        Neck: No JVD, no bruit, no mass felt. Heme: No neck lymph node enlargement. Cardiac: S1/S2, RRR, No murmurs, No gallops or rubs. Respiratory: No rales, wheezing, rhonchi or rubs. GI: Soft, nondistended, nontender, no rebound pain, no organomegaly, BS present. GU: No hematuria Ext: No pitting leg edema bilaterally. 2+DP/PT pulse bilaterally. Musculoskeletal: No joint deformities, No joint redness or warmth, no limitation of ROM in spin. Skin: No rashes.  Neuro: Alert, oriented X3, cranial nerves II-XII grossly intact except poor vision in both eyes, moves all extremities normally. Muscle strength 4/5  In left leg and 5/5 in all other extremities, sensation to light touch intact. Brachial reflex 2+ bilaterally. Negative Babinski's sign.  Psych: Patient is not psychotic, no suicidal or hemocidal ideation.  Labs on Admission: I have personally reviewed following labs and imaging studies  CBC: Recent Labs  Lab 05/31/17 1929  05/31/17 1938  WBC 5.3  --   NEUTROABS 4.0  --   HGB 14.8 16.7  HCT 44.7 49.0  MCV 82.2  --   PLT 270  --    Basic Metabolic Panel: Recent Labs  Lab 05/31/17 1929 05/31/17 1938  NA 139 142  K 4.0 3.9  CL 106 105  CO2 24  --   GLUCOSE 93 89  BUN 10 13  CREATININE 0.80 0.80  CALCIUM 9.2  --    GFR: Estimated Creatinine Clearance: 88.4 mL/min (by C-G formula based on SCr of 0.8 mg/dL). Liver Function Tests: Recent Labs  Lab 05/31/17 1929  AST 23  ALT 25  ALKPHOS 70  BILITOT 0.4  PROT 8.1  ALBUMIN 3.9   No results for input(s): LIPASE, AMYLASE in the last 168 hours. No results for input(s): AMMONIA in the last 168 hours. Coagulation Profile: Recent Labs  Lab 05/31/17 1929  INR 0.94   Cardiac Enzymes: No results for input(s): CKTOTAL, CKMB, CKMBINDEX, TROPONINI in the  last 168 hours. BNP (last 3 results) No results for input(s): PROBNP in the last 8760 hours. HbA1C: No results for input(s): HGBA1C in the last 72 hours. CBG: Recent Labs  Lab 05/31/17 1808  GLUCAP 83   Lipid Profile: No results for input(s): CHOL, HDL, LDLCALC, TRIG, CHOLHDL, LDLDIRECT in the last 72 hours. Thyroid Function Tests: Recent Labs    05/31/17 2004  TSH 0.773   Anemia Panel: No results for input(s): VITAMINB12, FOLATE, FERRITIN, TIBC, IRON, RETICCTPCT in the last 72 hours. Urine analysis:    Component Value Date/Time   COLORURINE YELLOW 02/08/2016 2220   APPEARANCEUR CLOUDY (A) 02/08/2016 2220   LABSPEC 1.023 02/08/2016 2220   PHURINE 5.0 02/08/2016 2220   GLUCOSEU NEGATIVE 02/08/2016 2220   HGBUR MODERATE (A) 02/08/2016 2220   BILIRUBINUR NEGATIVE 02/08/2016 2220   KETONESUR 15 (A) 02/08/2016 2220   PROTEINUR 30 (A) 02/08/2016 2220   UROBILINOGEN 0.2 11/12/2014 1015   NITRITE NEGATIVE 02/08/2016 2220   LEUKOCYTESUR MODERATE (A) 02/08/2016 2220   Sepsis Labs: _0 (procalcitonin:4,lacticidven:4) )No results found for this or any previous visit (from the past  240 hour(s)).   Radiological Exams on Admission: Mr Virgel Paling PN Contrast  Result Date: 05/31/2017 CLINICAL DATA:  Bilateral vision loss and subarachnoid hemorrhage. EXAM: MRI HEAD WITHOUT CONTRAST MRA HEAD WITHOUT CONTRAST TECHNIQUE: Multiplanar, multiecho pulse sequences of the brain and surrounding structures were obtained without intravenous contrast. Angiographic images of the head were obtained using MRA technique without contrast. COMPARISON:  Head CT 05/31/2017 the Brain MRI 11/11/2014 FINDINGS: MRI HEAD FINDINGS Brain: The midline structures are normal. No acute infarct. Small amount of hyperintensity on diffusion-weighted imaging in the right occipital lobe likely corresponds to the small amount of subarachnoid blood seen on the concomitant CT. No mass lesion, hydrocephalus, dural abnormality or extra-axial collection. Diffuse confluent hyperintense T2-weighted signal within the periventricular, deep and juxtacortical white matter, most commonly due to chronic ischemic microangiopathy. No age-advanced or lobar predominant atrophy. Extensive hemosiderin deposition within both posterior hemispheres, left-greater-than-right. Vascular: Major intracranial arterial and venous sinus flow voids are preserved. Skull and upper cervical spine: The visualized skull base, calvarium, upper cervical spine and extracranial soft tissues are normal. Sinuses/Orbits: No fluid levels or advanced mucosal thickening. No mastoid or middle ear effusion. Normal orbits. MRA HEAD FINDINGS Intracranial internal carotid arteries: Normal. Anterior cerebral arteries: Normal. Middle cerebral arteries: Right MCA bifurcation aneurysm measures approximately 3 mm, unchanged from 11/11/2014. Posterior communicating arteries: Absent bilaterally. Posterior cerebral arteries: Normal. Basilar artery: Normal. Vertebral arteries: Codominant.  Normal. Superior cerebellar arteries: Normal. Anterior inferior cerebellar arteries: Normal. Posterior  inferior cerebellar arteries: Normal. IMPRESSION: 1. No acute infarct. 2. Redemonstration of small volume right occipital subarachnoid hemorrhage. 3. Extensive bilateral posterior hemisphere hemosiderin deposition related to remote hemorrhagic events. 4. No emergent large vessel occlusion. No vascular abnormality that would explain the reported vision loss. 5. Severe chronic ischemic microangiopathy. 6. 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 Electronically Signed   By: Ulyses Jarred M.D.   On: 05/31/2017 19:48   Mr Brain Wo Contrast  Result Date: 05/31/2017 CLINICAL DATA:  Bilateral vision loss and subarachnoid hemorrhage. EXAM: MRI HEAD WITHOUT CONTRAST MRA HEAD WITHOUT CONTRAST TECHNIQUE: Multiplanar, multiecho pulse sequences of the brain and surrounding structures were obtained without intravenous contrast. Angiographic images of the head were obtained using MRA technique without contrast. COMPARISON:  Head CT 05/31/2017 the Brain MRI 11/11/2014 FINDINGS: MRI HEAD FINDINGS Brain: The midline structures are normal. No acute infarct. Small  amount of hyperintensity on diffusion-weighted imaging in the right occipital lobe likely corresponds to the small amount of subarachnoid blood seen on the concomitant CT. No mass lesion, hydrocephalus, dural abnormality or extra-axial collection. Diffuse confluent hyperintense T2-weighted signal within the periventricular, deep and juxtacortical white matter, most commonly due to chronic ischemic microangiopathy. No age-advanced or lobar predominant atrophy. Extensive hemosiderin deposition within both posterior hemispheres, left-greater-than-right. Vascular: Major intracranial arterial and venous sinus flow voids are preserved. Skull and upper cervical spine: The visualized skull base, calvarium, upper cervical spine and extracranial soft tissues are normal. Sinuses/Orbits: No fluid levels or advanced mucosal thickening. No mastoid or middle ear  effusion. Normal orbits. MRA HEAD FINDINGS Intracranial internal carotid arteries: Normal. Anterior cerebral arteries: Normal. Middle cerebral arteries: Right MCA bifurcation aneurysm measures approximately 3 mm, unchanged from 11/11/2014. Posterior communicating arteries: Absent bilaterally. Posterior cerebral arteries: Normal. Basilar artery: Normal. Vertebral arteries: Codominant.  Normal. Superior cerebellar arteries: Normal. Anterior inferior cerebellar arteries: Normal. Posterior inferior cerebellar arteries: Normal. IMPRESSION: 1. No acute infarct. 2. Redemonstration of small volume right occipital subarachnoid hemorrhage. 3. Extensive bilateral posterior hemisphere hemosiderin deposition related to remote hemorrhagic events. 4. No emergent large vessel occlusion. No vascular abnormality that would explain the reported vision loss. 5. Severe chronic ischemic microangiopathy. 6. 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 Electronically Signed   By: Ulyses Jarred M.D.   On: 05/31/2017 19:48   Ct Head Code Stroke Wo Contrast  Result Date: 05/31/2017 CLINICAL DATA:  Code stroke.  Blurred vision EXAM: CT HEAD WITHOUT CONTRAST TECHNIQUE: Contiguous axial images were obtained from the base of the skull through the vertex without intravenous contrast. COMPARISON:  Brain MRI 11/11/2014 and head CT 11/12/2014. FINDINGS: Brain: Small focus of acute subarachnoid hemorrhage along the superior aspect of the right parieto-occipital sulcus. No associated mass effect. There is encephalomalacia of the left occipital lobe at the site of remote hemorrhage. There is periventricular hypoattenuation compatible with chronic microvascular disease. The hemorrhagic focus measures 6 mm. Vascular: No hyperdense vessel or unexpected vascular calcification. Skull: Normal visualized skull base, calvarium and extracranial soft tissues. Sinuses/Orbits: No sinus fluid levels or advanced mucosal thickening. No mastoid  effusion. Normal orbits. IMPRESSION: 1. Small focus of acute subarachnoid blood in the right occipital lobe, along the right parieto-occipital sulcus. 2. No associated mass effect. 3. Severe chronic white matter disease and sequelae of prior bilateral occipital hemorrhages. Critical Value/emergent results were called by telephone at the time of interpretation on 05/31/2017 at 6:41 pm to Dr. Rory Percy, who verbally acknowledged these results. Electronically Signed   By: Ulyses Jarred M.D.   On: 05/31/2017 18:43     EKG: Independently reviewed.sinus rhythm, QTC 434, LAD, LAE, nonspecific T-wave change.  Assessment/Plan Principal Problem:   Vision loss Active Problems:   ICH (intracerebral hemorrhage) (HCC)   HTN (hypertension)   COPD (chronic obstructive pulmonary disease) (HCC)   Depression   HLD (hyperlipidemia)  Vision loss: not sure the small SAH is true underlying etiology. Neurology, Dr. Rory Percy was consulted. Per Dr. Rory Percy, it is not clear if this is due to recurrent PRES. other differential include vasculitides, CAA, RCVS. Finding on his recent MRI of brain on 04/2017 was suggetive for vasculitis and PRES.   -will admit to tele bed as inpt -control SBP<140 per Dr. Rory Percy -will f/u Dr. Johny Chess recommendations as follows:   -Check ESR CRP TSH UDS  -Check rheumatoid factor, c-ANCA, p-ANCA, ds-DNA, ANA, RPR, Hepatits B and Hepatits C testing  And  HIV testing.  -Might need a spinal tap for diagnosis along with possible DSA as well. Biopsy might be a last step consideration if all else is unrevealing.  -Frequent neurochecks  -PT OT ST  ICH: -avoid ASA and heparin PPx  Essential hypertension: -IV Hydralazine prn for SBP>140 -Continue home medications: amlodipine,  COPD: stable. -Pulmicort nebulizer, DuoNeb nebulizer -when necessary albuterol nebulizer  HLD: -lipitor  Depression: Stable, no suicidal or homicidal ideations. -Continue home medications: Lexapro   DVT ppx:  SCD Code Status: Full code Family Communication:  Yes, patient's  wife  at bed side Disposition Plan:  Anticipate discharge back to previous home environment Consults called:  Dr. Rory Percy of neuro Admission status:  Inpatient/tele     Date of Service 06/01/2017    Ivor Costa [facility] Pager [phone]  If 7PM-7AM, please contact night-coverage [email or web address] Password [email or web address] 06/01/2017, 5:01 AM

## 2017-06-01 ENCOUNTER — Other Ambulatory Visit: Payer: Self-pay

## 2017-06-01 DIAGNOSIS — J449 Chronic obstructive pulmonary disease, unspecified: Secondary | ICD-10-CM

## 2017-06-01 DIAGNOSIS — H547 Unspecified visual loss: Secondary | ICD-10-CM

## 2017-06-01 LAB — LIPID PANEL
Cholesterol: 169 mg/dL (ref 0–200)
HDL: 47 mg/dL (ref >40)
LDL CALC: 103 mg/dL — AB (ref 0–99)
Total CHOL/HDL Ratio: 3.6 RATIO
Triglycerides: 94 mg/dL (ref <150)
VLDL: 19 mg/dL (ref 0–40)

## 2017-06-01 LAB — HIV ANTIBODY (ROUTINE TESTING W REFLEX): HIV SCREEN 4TH GENERATION: NONREACTIVE

## 2017-06-01 LAB — HEMOGLOBIN A1C
HEMOGLOBIN A1C: 5.5 % (ref 4.8–5.6)
Mean Plasma Glucose: 111.15 mg/dL

## 2017-06-01 LAB — RPR: RPR: NONREACTIVE

## 2017-06-01 MED ORDER — SODIUM CHLORIDE 0.9 % IV SOLN
1000.0000 mg | Freq: Every day | INTRAVENOUS | Status: DC
  Administered 2017-06-01 – 2017-06-02 (×2): 1000 mg via INTRAVENOUS
  Filled 2017-06-01 (×3): qty 8

## 2017-06-01 MED ORDER — IPRATROPIUM-ALBUTEROL 0.5-2.5 (3) MG/3ML IN SOLN
3.0000 mL | Freq: Three times a day (TID) | RESPIRATORY_TRACT | Status: DC
  Administered 2017-06-02: 3 mL via RESPIRATORY_TRACT
  Filled 2017-06-01: qty 3

## 2017-06-01 MED ORDER — IPRATROPIUM-ALBUTEROL 0.5-2.5 (3) MG/3ML IN SOLN
3.0000 mL | Freq: Four times a day (QID) | RESPIRATORY_TRACT | Status: DC
  Administered 2017-06-01 (×3): 3 mL via RESPIRATORY_TRACT
  Filled 2017-06-01 (×3): qty 3

## 2017-06-01 NOTE — Progress Notes (Addendum)
NEUROHOSPITALISTS STROKE TEAM - DAILY PROGRESS NOTE   ADMISSION HISTORY: Luis Johnson is a 62 y.o. 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  He was normal this morning, has residual baseline left leg weakness, started noticing sudden vision loss and a headache involving the whole head 8/10 throbbing in nature that started around 5:30 PM.  His wife drove him to the hospital because his prior episodes of ICH and on review of charts also PRES were similar in presentation with headaches and visual loss. Has a long standing h/o headaches.  He was taken in for a stat CT of the head as a part of the code stroke protocol which showed a very small focus of acute subarachnoid blood in the right occipital lobe around the right parietal occipital sulcus along with severe chronic white matter disease and sequela of prior bilateral occipital hemorrhages.   SUBJECTIVE (INTERVAL HISTORY) Patient in bed in no acute distress.  He is alert, oriented x3, with normal mentation.  Patient continues to complain of severe peripheral vision loss and blurred vision.  Patient denies present pain, admits to headache, denies dizziness, nausea, vomiting or muscle weakness.  Plan of care discussed extensively with patient who verbalized understanding and is currently aware of pending vascular evaluation for temporal artery biopsy for ruling out temporal arteritis.   Laboratory Results  CBC:  Recent Labs  Lab 05/31/17 1929 05/31/17 1938  WBC 5.3  --   HGB 14.8 16.7  HCT 44.7 49.0  MCV  82.2  --   PLT 270  --    BMP: Recent Labs  Lab 05/31/17 1929 05/31/17 1938  NA 139 142  K 4.0 3.9  CL 106 105  CO2 24  --   GLUCOSE 93 89  BUN 10 13  CREATININE 0.80 0.80  CALCIUM 9.2  --    Thyroid Function Studies:  Recent Labs    05/31/17 2004  TSH 0.773   Coagulation Studies:  Recent Labs    05/31/17 1929  APTT 26  INR 0.94   Urine Drug Screen:     Component Value Date/Time   LABOPIA POSITIVE (A) 05/31/2017 2210   COCAINSCRNUR NONE DETECTED 05/31/2017 2210   LABBENZ NONE DETECTED 05/31/2017 2210   AMPHETMU NONE DETECTED 05/31/2017 2210   THCU NONE DETECTED 05/31/2017 2210   LABBARB NONE DETECTED 05/31/2017 2210    Alcohol Level: No results for input(s): ETH in the last 168 hours.  Physical Examination   Vitals:   06/01/17 0832 06/01/17 1000 06/01/17 1129 06/01/17 1138  BP: (!) 165/80 (!) 145/88 (!) 154/84 (!) 146/77  Pulse: 66   76  Resp: 17   17  Temp: 98.2 F (36.8 C)   98.6 F (37 C)  TempSrc: Oral   Oral  SpO2: 97%   96%  Weight:      Height:       General - Well nourished, well developed middle aged african [place] male, in no apparent distress HEENT-  Normocephalic,  Severe peripheral vision loss with blurred vision  Cardiovascular - Regular rate and rhythm  Respiratory - Lungs clear bilaterally. No  wheezing. Abdomen - soft and non-tender, BS normal Extremities- no edema or cyanosis  Neurological Examination  Mental Status -  Level of arousal and orientation to time, place, and person were intact. Language including expression, naming, repetition, comprehension was assessed and found intact. Attention span and concentration were normal Recent and remote memory were intact Fund of Knowledge was assessed and was intact Cranial Nerves II - XII - II - Severe peripheral vision loss bilaterally and can see only in central fields and can count fingers at 3 feet III, IV, VI - Extraocular movements intact. V - Facial sensation intact  bilaterally. VII - Facial movement intact bilaterally VIII - Hearing & vestibular intact bilaterally X - Palate elevates symmetrically XI - Chin turning & shoulder shrug intact bilaterally. XII - Tongue protrusion intact Motor Strength - The patient's strength was normal in all extremities and pronator drift was absent.  Bulk was normal and fasciculations were absent .mild weakness of left grip and diminished fine finger movements on the left which is old from prior stroke Motor Tone - Muscle tone was assessed at the neck and appendages and was normal Reflexes - The patient's reflexes were symmetrical in all extremities and he had no pathological reflexes Sensory - Light touch was assessed and was symmetrical Coordination - The patient had normal movements in the hands and feet with no ataxia or dysmetria.  Tremor was absent Gait and Station - deferred.  Imaging Results  [name] Head Wo Contrast 05/31/2017 IMPRESSION:  1. No acute infarct.  2. Redemonstration of small volume right occipital subarachnoid hemorrhage.  3. Extensive bilateral posterior hemisphere hemosiderin deposition related to remote hemorrhagic events.  4. No emergent large vessel occlusion. No vascular abnormality that would explain the reported vision loss.  5. Severe chronic ischemic microangiopathy.  6. 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  Mr Brain Wo Contrast  05/31/2017 IMPRESSION:  1. No acute infarct.  2. Redemonstration of small volume right occipital subarachnoid hemorrhage.  3. Extensive bilateral posterior hemisphere hemosiderin deposition related to remote hemorrhagic events.  4. No emergent large vessel occlusion. No vascular abnormality that would explain the reported vision loss.  5. Severe chronic ischemic microangiopathy.  6. 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  Ct Head Code Stroke Wo Contrast  05/31/2017 IMPRESSION:  1. Small focus of acute  subarachnoid blood in the right occipital lobe, along the right parieto-occipital sulcus.  2. No associated mass effect.  3. Severe chronic white matter disease and sequelae of prior bilateral occipital hemorrhages.   EEG:                                                                   SR with atrial enlargement Echocardiogram:                                              Not ordered B/L Carotid U/S:  Not ordered   IMPRESSION AND PLAN  Luis Johnson is a 62 y.o. male with PMH of    1.  Rule out temporal arteritis versus familial amyloid angiopathy.  Patient presented with third episode of complete vision loss associated with headache.  He has a history of microangiopathic cerebral white matter disese with extensive hemosiderin deposition as well as noted small right occipital hemorrhagic hemorrhage on MRI.  No acute infarcts noted.  Patient continues to have severe peripheral vision loss, but denies ocular pain.  Patient was recently hospitalized on February 14 at [facility] for similar sx.   We will also consult Dr. Donnetta Hutching vascular surgeon for evaluation and possible biopsy of temporal artery to rule out giant cell arteritis.  Autoimmune, infectious serologies pending.  In the interim, will start immediate IV Solu-Medrol 1000 mg x 3 days and monitor for improvement.   2.  Intracranial hemorrhage - MRI shows Small focus of acute subarachnoid blood in the right occipital lobe, along the right parieto-occipital sulcus.  Patient asymptomatic.  We will continue to monitor Maintain aggressive tight blood pressure control with SBP 1 20-1 30s to prevent worsening intracranial hemorrhage..  3.  Severe chronic ischemic microangiopathy 4. HX OF pres SYNDROM  Suspected Etiology:  HTN, microangiopathy with ICH.  Stroke Risk Factors: hyperlipidemia and hypertension, Advanced age Other Stroke Risk Factors: former smoker  ABCD2 Score: 5  PLAN 06/01/2017  Continue  aspirin at this time, but will consider starting aspirin in the next several days. Continue High dose Statin HOLD ASA until 24 hour post tPA neuroimaging is stable & without evidence of bleeding Frequent neuro checks Telemetry monitoring PT/OT/SLP Consult PM & Rehab Consult Case Management /MSW Pending autoimmune and infectious disease labs. Pending schedule of temporal artery arteritis Ongoing aggressive stroke risk factor management Patient counseled on Lifestyle modifications including, Diet, Exercise, and Stress Follow up with [facility] in 6 weeks   [place] by Internal Medicine Teams  Uncontrolled Hypertension - continue tight blood pressure management SBP Goal of 120-130.   DEPRESSION COPD HYPERLIPIDEMIA:    Component Value Date/Time   CHOL 169 06/01/2017 0551   TRIG 94 06/01/2017 0551   HDL 47 06/01/2017 0551   CHOLHDL 3.6 06/01/2017 0551   VLDL 19 06/01/2017 0551   LDLCALC 103 (H) 06/01/2017 0551   Home Meds:  NONE Recent Labs  Lab 05/31/17 1929  AST 23  ALT 25  ALKPHOS 70  BILITOT 0.4  PROT 8.1  ALBUMIN 3.9   Lab Results  Component Value Date   HGBA1C 5.5 06/01/2017      Hospital day # 1 VTE prophylaxis: SCD's  Diet : Fall precautions Diet Heart Room service appropriate? Yes; Fluid consistency: Thin     FAMILY UPDATES: No family at bedside  TEAM UPDATES: Samuella Cota, MD STATUS:    Discharge Information  Prior Home Stroke Medications:   Discharge Stroke Meds:  Please discharge patient on pending- will consider starting low dose ASA in several days     Disposition:  Therapy Recs:               Outpatient physical therapy, NO SLP reccomendations Home Equipment:         3 in 1 vs Tub bench  Follow up Appointments  Follow Up:  Everardo Beals, NP -PCP Follow up in 1-2 weeks   Case Management aware of need  -Follow up with Ophthalmology for     Assessment & plan discussed with  with attending  physician and they are in agreement.    Jacob Moores, NP Stroke Neurology Team 06/01/2017, 2:55 PM  06/01/2017 ATTENDING ASSESSMENT   I have personally examined this patient, reviewed notes, independently viewed imaging studies, participated in medical decision making and plan of care.ROS completed by me personally and pertinent positives fully documented  I have made any additions or clarifications directly to the above note. Agree with note above.this is an interesting and puzzling case. He had patient has had multiple admissions for headache and bilateralvision loss with brain MRI scan showing posterior hemorrhages with multiple microhemorrhages and white matter changes mostly in the posterior part of the brain. He does have a history of hypertension but his blood pressure has not been significantly difficult to control. 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 Continue to maintain strict blood pressure controlled. Long discussion with the patient, Dr. Sarajane Jews and Dr. Donnetta Hutching and answered questions. Greater than 50% time during this 35 minute visit was spent on counseling and coordination of care about his recurrent episodes of vision loss, abnormal MRI scan and plan for evaluation and treatment and answered questions.  Antony Contras, MD Medical Director [facility] Pager: [phone] 06/01/2017 3:33 PM    Neurology  to sign-off at this time. Please call with any further questions or concerns. Thank you for this consultation.  To contact Stroke Provider, please refer to [email or web address] After hours, contact General Neurology

## 2017-06-01 NOTE — Evaluation (Signed)
  Speech Language Pathology Evaluation Patient Details Name: Luis Johnson MRN: [id number] DOB: November 15, 1955 Today's Date: 06/01/2017 Time: [id number] SLP Time Calculation (min) (ACUTE ONLY): 13 min  Problem List:  Patient Active Problem List   Diagnosis Date Noted  . Vision loss 06/01/2017  . Depression 05/31/2017  . HLD (hyperlipidemia) 05/31/2017  . Prostatitis, acute 02/09/2016  . HTN (hypertension) 02/09/2016  . COPD (chronic obstructive pulmonary disease) (HCC) 02/09/2016  . AKI (acute kidney injury) (HCC) 02/09/2016  . Transaminitis 02/09/2016  . Sepsis (HCC)   . UTI (urinary tract infection) 02/08/2016  . Cerebral amyloid angiopathy (HCC) 11/27/2014  . 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 ICH (intracerebral hemorrhage) (HCC) 11/11/2014   Past Medical History:  Past Medical History:  Diagnosis Date  . Hypertension   . Stroke [facility](HCC)    Past Surgical History:  Past Surgical History:  Procedure Laterality Date  . SHOULDER SURGERY     HPI:  62 y.o.malewith medical history significant forICH, hypertension, hyperlipidemia, COPD, depression, PRES syndrome, who presents with vision loss. Hx of ICH x2 (2016 and 04/28/17) - has had bilateral vision loss which was improving, then with acute worsening, precipitating visit to ED 3/19. MRI with no acute infarct, redemonstration of small volume right occipital subarachnoid hemorrhage, extensive bilateral posterior hemisphere hemosiderin deposition related to remote hemorrhagic events.   Assessment / Plan / Recommendation Clinical Impression  Pt presents with baseline mild short term memory deficits; expressive and receptive language are intact; he is good historian; speech is clear; good insight.  No acute cognitive-linguistic deficits identified - our services will sign off.     SLP Assessment  SLP Recommendation/Assessment: Patient does not need any further Speech Lanaguage Pathology Services    Follow Up Recommendations  None    Frequency and Duration           SLP Evaluation Cognition  Overall Cognitive Status: Within Functional Limits for tasks assessed Arousal/Alertness: Awake/alert Orientation Level: Oriented X4 Attention: Sustained Sustained Attention: Appears intact Memory: Impaired Memory Impairment: Retrieval deficit Awareness: Appears intact Problem Solving: Appears intact Safety/Judgment: Appears intact       Comprehension  Auditory Comprehension Overall Auditory Comprehension: Appears within functional limits for tasks assessed Reading Comprehension Reading Status: Not tested    Expression Expression Primary Mode of Expression: Verbal Verbal Expression Overall Verbal Expression: Appears within functional limits for tasks assessed Written Expression Dominant Hand: Right   Oral / Motor  Oral Motor/Sensory Function Overall Oral Motor/Sensory Function: Within functional limits Motor Speech Overall Motor Speech: Appears within functional limits for tasks assessed   GO                    Luis Johnson, Luis Johnson 06/01/2017, 10:49 AM

## 2017-06-01 NOTE — Evaluation (Signed)
Physical Therapy Evaluation Patient Details Name: Luis Johnson MRN: [id number] DOB: [birth year] Today's Date: 06/01/2017   History of Present Illness  Pt is a 62 y/o male who presents with headache, LLE weakness and vision changes. Imaging revealed no acute infarct, however pt has had prior ICH x2. Most recent in feb 2019, with residual vision deficits.   Clinical Impression  Pt admitted with above diagnosis. Pt currently with functional limitations due to the deficits listed below (see PT Problem List). At the time of PT eval pt was able to perform transfers with up to supervision for safety, and ambulation with min guard assist for balance support. Pt reports that his vision has improved since admission however is not baseline, and is wearing sunglasses for a portion of the session due to light sensitivity. It [facility] admission. Feel this continues to be appropriate at d/c. Pt will benefit from skilled PT to increase their independence and safety with mobility to allow discharge to the venue listed below.       Follow Up Recommendations Outpatient PT;Supervision for mobility/OOB    Equipment Recommendations  3in1 (PT)(vs tub bench - will defer to OT)    Recommendations for Other Services       Precautions / Restrictions Precautions Precautions: Fall Precaution Comments: Low vision from prior ICH; appears to have some degree of vision loss to the R/R lower quadrant.  Restrictions Weight Bearing Restrictions: No      Mobility  Bed Mobility Overal bed mobility: Modified Independent Bed Mobility: Supine to Sit           General bed mobility comments: Pt was able to transition to EOB without difficulty. Pt managed bed linen and held telemetry box during transfer to EOB.   Transfers Overall transfer level: Needs assistance Equipment used: None Transfers: Sit to/from Stand Sit to Stand: Supervision          General transfer comment: Close supervision for safety as pt powered up to full standing position. He demonstrated proper hand placement on seated surface for safety.   Ambulation/Gait Ambulation/Gait assistance: Min guard Ambulation Distance (Feet): 200 Feet Assistive device: Rolling walker (2 wheeled);None Gait Pattern/deviations: Step-through pattern;Decreased step length - left;Decreased dorsiflexion - left;Decreased weight shift to left Gait velocity: Decreased Gait velocity interpretation: Below normal speed for age/gender General Gait Details: Initially with RW, and pt eager to attempt without AD. Without walker, pt required hands-on guarding for balance support, however no overt LOB noted.   Stairs            Wheelchair Mobility    Modified Rankin (Stroke Patients Only)       Balance Overall balance assessment: Needs assistance Sitting-balance support: Feet supported;No upper extremity supported Sitting balance-Leahy Scale: Good     Standing balance support: No upper extremity supported;During functional activity Standing balance-Leahy Scale: Fair                               Pertinent Vitals/Pain Pain Assessment: No/denies pain Pain Score: 7  Pain Location: Headache Pain Intervention(s): RN gave pain meds during session    Home Living Family/patient expects to be discharged to:: Private residence Living Arrangements: Spouse/significant other Available Help at Discharge: Family;Available 24 hours/day Type of Home: House Home Access: Stairs to enter Entrance Stairs-Rails: Right;Left;Can reach both Entrance Stairs-Number of Steps: 3 Home Layout: One level Home Equipment: Gilmer Morane -  single point;Cane - quad      Prior Function Level of Independence: Needs assistance   Gait / Transfers Assistance Needed: Used the quad cane most of the time.   ADL's / Homemaking Assistance Needed: Occasionally required assistance with stepping into the tub,  sitting down on the edge of the tub, and washing. Required assist for dressing from wife as well. L shoulder replacement about a year ago.         Hand Dominance   Dominant Hand: Right    Extremity/Trunk Assessment   Upper Extremity Assessment Upper Extremity Assessment: Defer to OT evaluation    Lower Extremity Assessment Lower Extremity Assessment: RLE deficits/detail;LLE deficits/detail RLE Deficits / Details: Decreased strength with MMT however not as weak as LLE. Noted trace R ankle edema laterally around the malleolus. Pt reports pain on the medial aspect of the ankle with palpation and tightness in ankle during ambulation. No pain reported with passive movement of the ankle in all directions.  RLE Sensation: WNL RLE Coordination: WNL LLE Deficits / Details: Decreased strength with MMT. Grossly 2/5 in quads, 4-/5 in hamstrings, 4-/5 hip flexors, 3/5 dorsiflexion LLE Sensation: decreased light touch LLE Coordination: decreased fine motor;decreased gross motor    Cervical / Trunk Assessment Cervical / Trunk Assessment: Normal  Communication   Communication: No difficulties  Cognition Arousal/Alertness: Awake/alert Behavior During Therapy: WFL for tasks assessed/performed Overall Cognitive Status: Within Functional Limits for tasks assessed                                        General Comments      Exercises     Assessment/Plan    PT Assessment Patient needs continued PT services  PT Problem List Decreased strength;Decreased range of motion;Decreased activity tolerance;Decreased balance;Decreased mobility;Decreased knowledge of use of DME;Decreased safety awareness;Decreased knowledge of precautions;Pain       PT Treatment Interventions DME instruction;Gait training;Stair training;Functional mobility training;Therapeutic activities;Therapeutic exercise;Neuromuscular re-education;Patient/family education    PT Goals (Current goals can be found in  the Care Plan section)  Acute Rehab PT Goals Patient Stated Goal: Home and to start therapy when [facility] PT Goal Formulation: With patient Time For Goal Achievement: 06/08/17 Potential to Achieve Goals: Good    Frequency Min 3X/week   Barriers to discharge        Co-evaluation               AM-PAC PT "6 Clicks" Daily Activity  Outcome Measure Difficulty turning over in bed (including adjusting bedclothes, sheets and blankets)?: None Difficulty moving from lying on back to sitting on the side of the bed? : None Difficulty sitting down on and standing up from a chair with arms (e.g., wheelchair, bedside commode, etc,.)?: A Little Help needed moving to and from a bed to chair (including a wheelchair)?: A Little Help needed walking in hospital room?: A Little Help needed climbing 3-5 steps with a railing? : A Little 6 Click Score: 20    End of Session Equipment Utilized During Treatment: Gait belt Activity Tolerance: Patient tolerated treatment well Patient left: in chair;with call bell/phone within reach Nurse Communication: Mobility status PT Visit Diagnosis: Unsteadiness on feet (R26.81);Other symptoms and signs involving the nervous system (R29.898)    Time: 1610-9604 PT Time Calculation (min) (ACUTE ONLY): 38 min   Charges:   PT Evaluation $PT Eval Moderate Complexity: 1 Mod PT Treatments $Gait  Training: 23-37 mins   PT G Codes:        Conni Slipper, PT, DPT Acute Rehabilitation Services Pager: [phone]   Marylynn Pearson 06/01/2017, 11:35 AM

## 2017-06-01 NOTE — Progress Notes (Signed)
Patient refused bed alarm. Patient educated on falls precautions. Patient informed nurse he will call for help before getting out of bed.

## 2017-06-01 NOTE — Progress Notes (Signed)
  PROGRESS NOTE  Eulon Allnutt GBT:[id number] DOB: 1956/01/08 DOA: 05/31/2017 PCP: Everardo Beals, NP  Brief Narrative: 62 year old man PMH essential hypertension, ICH left parietal occipital, right parietal occipital areas 2016, ICH involving right parietal lobe April 28, 2017, presented to the emergency department with headache, bilateral visual loss.  Seen by neurology emergently, stat CT head showed small focus acute subarachnoid blood right occipital lobe, severe chronic white matter disease.  Assessment/Plan Acute vision loss in a patient with history of PRES and multiple strokes.  Differential includes vasculitides, CAA, RCVS.  Speech therapy recommends no further evaluation.  Physical therapy recommends outpatient PT. --Stroke team is recommended IV Solu-Medrol for 3 days as well as vascular surgery consultation for biopsy to rule out temporal arteritis.  Follow-up autoimmune serologies.  Intracranial hemorrhage, MRI showed small focus acute subarachnoid blood right occipital lobe, right parietal occipital sulcus. --Asymptomatic.  Keep SBP less than 140  Recent admission for nontraumatic subcortical hemorrhage right cerebral hemisphere, PRES, last seen by neurology 2/20 follow-up after total vision loss left eye, followed by right eye, subsequently both resolved.  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 --Avoid aspirin, heparin   DVT prophylaxis: SCDs Code Status: full  Family Communication: none Disposition Plan: home    Murray Hodgkins, MD  [facility] Direct contact: [phone] --Via [place]  --[email or web address]; password [email or web address]  7PM-7AM contact night coverage as above 06/01/2017, 2:27 PM  LOS: 1 day   Consultants:  Neurology  Vascular surgery  Procedures:    Antimicrobials:    Interval  history/Subjective: Vision is improved today but is still blurry.  Numbness in left arm is resolved.  Numbness in left leg persists.  Objective: Vitals:  Vitals:   06/01/17 1129 06/01/17 1138  BP: (!) 154/84 (!) 146/77  Pulse:  76  Resp:  17  Temp:  98.6 F (37 C)  SpO2:  96%    Exam:  Constitutional:  . Appears calm and comfortable Eyes:  . pupils appear normal ENMT:  . grossly normal hearing  Respiratory:  . CTA bilaterally, no w/r/r.  . Respiratory effort normal Cardiovascular:  . RRR, no m/r/g . No LE extremity edema   Psychiatric:  . Mental status o Mood, affect appropriate  I have personally reviewed the following:   Labs:  Basic metabolic panel unremarkable  Troponin negative  Hepatic function panel unremarkable   LDL 103, CRP negative.  ESR 5.  TSH within normal limits.  Hemoglobin A1c 5.5.  Imaging studies:  MRI brain no acute infarct.  Small volume right occipital subarachnoid hemorrhage.  Extensive bilateral posterior hemisphere hemosiderin deposition related to remote hemorrhagic infarcts.  No emergent large vessel occlusion.  Severe chronic ischemic microangiopathy.  Medical tests:  EKG normal sinus rhythm  Scheduled Meds: . amLODipine  10 mg Oral Daily  . atorvastatin  80 mg Oral Daily  . budesonide (PULMICORT) nebulizer solution  0.25 mg Nebulization BID  . escitalopram  10 mg Oral Daily  . ipratropium-albuterol  3 mL Inhalation Q6H   Continuous Infusions: . methylPREDNISolone (SOLU-MEDROL) injection Stopped (06/01/17 1342)    Principal Problem:   Vision loss Active Problems:   ICH (intracerebral hemorrhage) (HCC)   HTN (hypertension)   COPD (chronic obstructive pulmonary disease) (HCC)   Depression   HLD (hyperlipidemia)   LOS: 1 day

## 2017-06-01 NOTE — Consult Note (Addendum)
[facility] CONSULT NOTE   MRN : [id number]  Reason for Consult: temporal arteritis Referring Physician: Dr. Pearlean BrownieSethi  History of Present Illness: 62 y/o male with history of Intercranial bleeds times two with vision changes presented to the ED with a chief complaint of flashing lights in his eyes and sudden vision loss bilaterally.  His vision is slowly improving.   His wife reported stuttering speech and left leg weakness.  He states he has left leg weakness from previous ICH, but it feels weaker now.  He is being worked up for stroke.     MRI shows no new infarct.  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  He has been started on Methylprednisolone.     Past medical history includes: ICH 2016 and 04/28/2017.  COPD,hyperlipidwmia, depression and posterior reversible encephalopathy syndrome.       Current Facility-Administered Medications  Medication Dose Route Frequency Provider Last Rate Last Dose  . acetaminophen (TYLENOL) tablet 650 mg  650 mg Oral TID PRN Lorretta HarpNiu, Xilin, MD   650 mg at 06/01/17 1001  . albuterol (PROVENTIL) (2.5 MG/3ML) 0.083% nebulizer solution 2.5 mg  2.5 mg Nebulization Q4H PRN Lorretta HarpNiu, Xilin, MD      . amLODipine (NORVASC) tablet 10 mg  10 mg Oral Daily Lorretta HarpNiu, Xilin, MD   10 mg at 06/01/17 0853  . atorvastatin (LIPITOR) tablet 80 mg  80 mg Oral Daily Lorretta HarpNiu, Xilin, MD   80 mg at 06/01/17 0854  . budesonide (PULMICORT) nebulizer solution 0.25 mg  0.25 mg Nebulization BID Lorretta HarpNiu, Xilin, MD   0.25 mg at 06/01/17 0926  . EPINEPHrine (EPI-PEN) injection 0.3 mg  0.3 mg Intramuscular Daily PRN Lorretta HarpNiu, Xilin, MD      . escitalopram (LEXAPRO) tablet 10 mg  10 mg Oral Daily Lorretta HarpNiu, Xilin, MD   10 mg at 06/01/17 0901  . hydrALAZINE (APRESOLINE) injection 5 mg  5 mg Intravenous Q2H PRN Lorretta HarpNiu, Xilin, MD   5 mg at 06/01/17 0902  . ipratropium-albuterol (DUONEB) 0.5-2.5 (3) MG/3ML nebulizer solution 3 mL  3 mL  Inhalation Q6H Lorretta HarpNiu, Xilin, MD   3 mL at 06/01/17 0926  . methylPREDNISolone sodium succinate (SOLU-MEDROL) 1,000 mg in sodium chloride 0.9 % 50 mL IVPB  1,000 mg Intravenous Daily Vivia EwingAmankwah, Pearl, NP   Stopped at 06/01/17 1342  . senna-docusate (Senokot-S) tablet 1 tablet  1 tablet Oral QHS PRN Lorretta HarpNiu, Xilin, MD        Pt meds include: Statin :Yes Betablocker: No ASA: No Other anticoagulants/antiplatelets: none  Past Medical History:  Diagnosis Date  . Hypertension   . Stroke [facility](HCC)     Past Surgical History:  Procedure Laterality Date  . SHOULDER SURGERY      Social History Social History   Tobacco Use  . Smoking status: Former Smoker    Last attempt to quit: 01/21/1994    Years since quitting: 23.3  . Smokeless tobacco: Never Used  Substance Use Topics  . Alcohol use: No  . Drug use: No    Family History Family History  Problem Relation Age of Onset  . Seizures Mother   . Heart failure Mother   . Stroke Brother   . Heart attack Brother     Allergies  Allergen Reactions  . Eggs Or Egg-Derived Products Swelling     REVIEW OF SYSTEMS  General: [ ]  Weight loss, [ ]  Fever, [ ]  chills Neurologic: [ ]  Dizziness, [ ]   Blackouts, [ ]  Seizure [ ]  Stroke, [ ]  "Mini stroke", [ ]  Slurred speech, [x ] Temporary blindness; [x ] weakness in arms or legs, [ ]  Hoarseness [ ]  Dysphagia Cardiac: [ ]  Chest pain/pressure, [ ]  Shortness of breath at rest [ ]  Shortness of breath with exertion, [ ]  Atrial fibrillation or irregular heartbeat  Vascular: [ ]  Pain in legs with walking, [ ]  Pain in legs at rest, [ ]  Pain in legs at night,  [ ]  Non-healing ulcer, [ ]  Blood clot in vein/DVT,   Pulmonary: [ ]  Home oxygen, [ ]  Productive cough, [ ]  Coughing up blood, [ ]  Asthma,  [ ]  Wheezing [ ]  COPD Musculoskeletal:  [ ]  Arthritis, [ ]  Low back pain, [ ]  Joint pain Hematologic: [ ]  Easy Bruising, [ ]  Anemia; [ ]  Hepatitis Gastrointestinal: [ ]  Blood in stool, [ ]  Gastroesophageal  Reflux/heartburn, Urinary: [ ]  chronic Kidney disease, [ ]  on HD - [ ]  MWF or [ ]  TTHS, [ ]  Burning with urination, [ ]  Difficulty urinating Skin: [ ]  Rashes, [ ]  Wounds Psychological: [ ]  Anxiety, [x ] Depression  Physical Examination Vitals:   06/01/17 0832 06/01/17 1000 06/01/17 1129 06/01/17 1138  BP: (!) 165/80 (!) 145/88 (!) 154/84 (!) 146/77  Pulse: 66   76  Resp: 17   17  Temp: 98.2 F (36.8 C)   98.6 F (37 C)  TempSrc: Oral   Oral  SpO2: 97%   96%  Weight:      Height:       Body mass index is 25.28 kg/m.  General:  WDWN in NAD HENT: WNL Eyes: Pupils equal, occular motor intact, vision blurry Pulmonary: normal non-labored breathing , without Rales, rhonchi,  wheezing Cardiac: RRR, without  Murmurs, rubs or gallops; No carotid bruits Abdomen: soft, NT, no masses Skin: no rashes, ulcers noted;  no Gangrene , no cellulitis; no open wounds;   Vascular Exam/Pulses:palpable radial, brachial, femoral , DP B pulses   Musculoskeletal: no muscle wasting or atrophy; no edema  Neurologic: A&O X 3; Appropriate Affect ;  SENSATION: normal; MOTOR FUNCTION: 5/5 UE Symmetric, right LE 5/5, left 4+/5 Speech is fluent/normal   Significant Diagnostic Studies: CBC Lab Results  Component Value Date   WBC 5.3 05/31/2017   HGB 16.7 05/31/2017   HCT 49.0 05/31/2017   MCV 82.2 05/31/2017   PLT 270 05/31/2017    BMET    Component Value Date/Time   NA 142 05/31/2017 1938   K 3.9 05/31/2017 1938   CL 105 05/31/2017 1938   CO2 24 05/31/2017 1929   GLUCOSE 89 05/31/2017 1938   BUN 13 05/31/2017 1938   CREATININE 0.80 05/31/2017 1938   CALCIUM 9.2 05/31/2017 1929   GFRNONAA >60 05/31/2017 1929   GFRAA >60 05/31/2017 1929   Estimated Creatinine Clearance: 88.4 mL/min (by C-G formula based on SCr of 0.8 mg/dL).  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  SED rate is 5,   We will plan bilateral temporal artery biopsy. Will discuss scheduling with Dr. Darrick Penna.  Continue steroids for now and BP control.     Mosetta Pigeon 06/01/2017 2:12 PM   History and exam details as above.  Pt has 2+ temporal pulses.  Procedure risk  benefit discussed.  Bilateral temporal artery biopsy Dr Arbie Cookey on Friday.  06/03/17  Fabienne Bruns, MD [facility] Office: [phone] Pager: [phone]

## 2017-06-01 NOTE — H&P (View-Only) (Signed)
[facility] CONSULT NOTE   MRN : [id number]  Reason for Consult: temporal arteritis Referring Physician: Dr. Pearlean BrownieSethi  History of Present Illness: 62 y/o male with history of Intercranial bleeds times two with vision changes presented to the ED with a chief complaint of flashing lights in his eyes and sudden vision loss bilaterally.  His vision is slowly improving.   His wife reported stuttering speech and left leg weakness.  He states he has left leg weakness from previous ICH, but it feels weaker now.  He is being worked up for stroke.     MRI shows no new infarct.  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  He has been started on Methylprednisolone.     Past medical history includes: ICH 2016 and 04/28/2017.  COPD,hyperlipidwmia, depression and posterior reversible encephalopathy syndrome.       Current Facility-Administered Medications  Medication Dose Route Frequency Provider Last Rate Last Dose  . acetaminophen (TYLENOL) tablet 650 mg  650 mg Oral TID PRN Lorretta HarpNiu, Xilin, MD   650 mg at 06/01/17 1001  . albuterol (PROVENTIL) (2.5 MG/3ML) 0.083% nebulizer solution 2.5 mg  2.5 mg Nebulization Q4H PRN Lorretta HarpNiu, Xilin, MD      . amLODipine (NORVASC) tablet 10 mg  10 mg Oral Daily Lorretta HarpNiu, Xilin, MD   10 mg at 06/01/17 0853  . atorvastatin (LIPITOR) tablet 80 mg  80 mg Oral Daily Lorretta HarpNiu, Xilin, MD   80 mg at 06/01/17 0854  . budesonide (PULMICORT) nebulizer solution 0.25 mg  0.25 mg Nebulization BID Lorretta HarpNiu, Xilin, MD   0.25 mg at 06/01/17 0926  . EPINEPHrine (EPI-PEN) injection 0.3 mg  0.3 mg Intramuscular Daily PRN Lorretta HarpNiu, Xilin, MD      . escitalopram (LEXAPRO) tablet 10 mg  10 mg Oral Daily Lorretta HarpNiu, Xilin, MD   10 mg at 06/01/17 0901  . hydrALAZINE (APRESOLINE) injection 5 mg  5 mg Intravenous Q2H PRN Lorretta HarpNiu, Xilin, MD   5 mg at 06/01/17 0902  . ipratropium-albuterol (DUONEB) 0.5-2.5 (3) MG/3ML nebulizer solution 3 mL  3 mL  Inhalation Q6H Lorretta HarpNiu, Xilin, MD   3 mL at 06/01/17 0926  . methylPREDNISolone sodium succinate (SOLU-MEDROL) 1,000 mg in sodium chloride 0.9 % 50 mL IVPB  1,000 mg Intravenous Daily Vivia EwingAmankwah, Pearl, NP   Stopped at 06/01/17 1342  . senna-docusate (Senokot-S) tablet 1 tablet  1 tablet Oral QHS PRN Lorretta HarpNiu, Xilin, MD        Pt meds include: Statin :Yes Betablocker: No ASA: No Other anticoagulants/antiplatelets: none  Past Medical History:  Diagnosis Date  . Hypertension   . Stroke [facility](HCC)     Past Surgical History:  Procedure Laterality Date  . SHOULDER SURGERY      Social History Social History   Tobacco Use  . Smoking status: Former Smoker    Last attempt to quit: 01/21/1994    Years since quitting: 23.3  . Smokeless tobacco: Never Used  Substance Use Topics  . Alcohol use: No  . Drug use: No    Family History Family History  Problem Relation Age of Onset  . Seizures Mother   . Heart failure Mother   . Stroke Brother   . Heart attack Brother     Allergies  Allergen Reactions  . Eggs Or Egg-Derived Products Swelling     REVIEW OF SYSTEMS  General: [ ]  Weight loss, [ ]  Fever, [ ]  chills Neurologic: [ ]  Dizziness, [ ]   Blackouts, [ ]  Seizure [ ]  Stroke, [ ]  "Mini stroke", [ ]  Slurred speech, [x ] Temporary blindness; [x ] weakness in arms or legs, [ ]  Hoarseness [ ]  Dysphagia Cardiac: [ ]  Chest pain/pressure, [ ]  Shortness of breath at rest [ ]  Shortness of breath with exertion, [ ]  Atrial fibrillation or irregular heartbeat  Vascular: [ ]  Pain in legs with walking, [ ]  Pain in legs at rest, [ ]  Pain in legs at night,  [ ]  Non-healing ulcer, [ ]  Blood clot in vein/DVT,   Pulmonary: [ ]  Home oxygen, [ ]  Productive cough, [ ]  Coughing up blood, [ ]  Asthma,  [ ]  Wheezing [ ]  COPD Musculoskeletal:  [ ]  Arthritis, [ ]  Low back pain, [ ]  Joint pain Hematologic: [ ]  Easy Bruising, [ ]  Anemia; [ ]  Hepatitis Gastrointestinal: [ ]  Blood in stool, [ ]  Gastroesophageal  Reflux/heartburn, Urinary: [ ]  chronic Kidney disease, [ ]  on HD - [ ]  MWF or [ ]  TTHS, [ ]  Burning with urination, [ ]  Difficulty urinating Skin: [ ]  Rashes, [ ]  Wounds Psychological: [ ]  Anxiety, [x ] Depression  Physical Examination Vitals:   06/01/17 0832 06/01/17 1000 06/01/17 1129 06/01/17 1138  BP: (!) 165/80 (!) 145/88 (!) 154/84 (!) 146/77  Pulse: 66   76  Resp: 17   17  Temp: 98.2 F (36.8 C)   98.6 F (37 C)  TempSrc: Oral   Oral  SpO2: 97%   96%  Weight:      Height:       Body mass index is 25.28 kg/m.  General:  WDWN in NAD HENT: WNL Eyes: Pupils equal, occular motor intact, vision blurry Pulmonary: normal non-labored breathing , without Rales, rhonchi,  wheezing Cardiac: RRR, without  Murmurs, rubs or gallops; No carotid bruits Abdomen: soft, NT, no masses Skin: no rashes, ulcers noted;  no Gangrene , no cellulitis; no open wounds;   Vascular Exam/Pulses:palpable radial, brachial, femoral , DP B pulses   Musculoskeletal: no muscle wasting or atrophy; no edema  Neurologic: A&O X 3; Appropriate Affect ;  SENSATION: normal; MOTOR FUNCTION: 5/5 UE Symmetric, right LE 5/5, left 4+/5 Speech is fluent/normal   Significant Diagnostic Studies: CBC Lab Results  Component Value Date   WBC 5.3 05/31/2017   HGB 16.7 05/31/2017   HCT 49.0 05/31/2017   MCV 82.2 05/31/2017   PLT 270 05/31/2017    BMET    Component Value Date/Time   NA 142 05/31/2017 1938   K 3.9 05/31/2017 1938   CL 105 05/31/2017 1938   CO2 24 05/31/2017 1929   GLUCOSE 89 05/31/2017 1938   BUN 13 05/31/2017 1938   CREATININE 0.80 05/31/2017 1938   CALCIUM 9.2 05/31/2017 1929   GFRNONAA >60 05/31/2017 1929   GFRAA >60 05/31/2017 1929   Estimated Creatinine Clearance: 88.4 mL/min (by C-G formula based on SCr of 0.8 mg/dL).  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  SED rate is 5,   We will plan bilateral temporal artery biopsy. Will discuss scheduling with Dr. Darrick Penna.  Continue steroids for now and BP control.     Mosetta Pigeon 06/01/2017 2:12 PM   History and exam details as above.  Pt has 2+ temporal pulses.  Procedure risk  benefit discussed.  Bilateral temporal artery biopsy Dr Arbie Cookey on Friday.  06/03/17  Fabienne Bruns, MD [facility] Office: [phone] Pager: [phone]

## 2017-06-01 NOTE — Plan of Care (Signed)
  Progressing Education: Knowledge of disease or condition will improve 06/01/2017 1545 - Progressing by Melany GuernseyMcLean, Kaelea Gathright M, RN Knowledge of secondary prevention will improve 06/01/2017 1545 - Progressing by Melany GuernseyMcLean, Finn Amos M, RN Knowledge of patient specific risk factors addressed and post discharge goals established will improve 06/01/2017 1545 - Progressing by Melany GuernseyMcLean, Charlsey Moragne M, RN Education: Knowledge of General Education information will improve 06/01/2017 1545 - Progressing by Melany GuernseyMcLean, Marissia Blackham M, RN Health Behavior/Discharge Planning: Ability to manage health-related needs will improve 06/01/2017 1545 - Progressing by Melany GuernseyMcLean, Samah Lapiana M, RN Clinical Measurements: Ability to maintain clinical measurements within normal limits will improve 06/01/2017 1545 - Progressing by Melany GuernseyMcLean, Janki Dike M, RN Will remain free from infection 06/01/2017 1545 - Progressing by Melany GuernseyMcLean, Safwan Tomei M, RN Diagnostic test results will improve 06/01/2017 1545 - Progressing by Melany GuernseyMcLean, Zaleah Ternes M, RN Respiratory complications will improve 06/01/2017 1545 - Progressing by Melany GuernseyMcLean, Michelle Vanhise M, RN Cardiovascular complication will be avoided 06/01/2017 1545 - Progressing by Melany GuernseyMcLean, Rushil Kimbrell M, RN Activity: Risk for activity intolerance will decrease 06/01/2017 1545 - Progressing by Melany GuernseyMcLean, Mauria Asquith M, RN Nutrition: Adequate nutrition will be maintained 06/01/2017 1545 - Progressing by Melany GuernseyMcLean, Jewelz Ricklefs M, RN Coping: Level of anxiety will decrease 06/01/2017 1545 - Progressing by Melany GuernseyMcLean, Evelene Roussin M, RN Elimination: Will not experience complications related to bowel motility 06/01/2017 1545 - Progressing by Melany GuernseyMcLean, Alyssandra Hulsebus M, RN Will not experience complications related to urinary retention 06/01/2017 1545 - Progressing by Melany GuernseyMcLean, Marisabel Macpherson M, RN Pain Managment: General experience of comfort will improve 06/01/2017 1545 - Progressing by Melany GuernseyMcLean, Colyn Miron M, RN Safety: Ability to remain free from injury will  improve 06/01/2017 1545 - Progressing by Melany GuernseyMcLean, Asencion Loveday M, RN Skin Integrity: Risk for impaired skin integrity will decrease 06/01/2017 1545 - Progressing by Melany GuernseyMcLean, Kahlie Deutscher M, RN

## 2017-06-01 NOTE — Evaluation (Signed)
Occupational Therapy Evaluation Patient Details Name: Luis Johnson MRN: [id number] DOB: [birth year] Today's Date: 06/01/2017    History of Present Illness Pt is a 62 y/o male who presents with headache, LLE weakness and vision changes. Imaging revealed no acute infarct, however pt has had prior ICH x2. Most recent in feb 2019, with residual vision deficits.    Clinical Impression   Pt was using a quad cane for ambulation, assisted for tub transfers, IADL and occasionally for dressing. Pt reports baseline "blind spot" slightly R of midline for which he has learned to compensate by turning his head. He reports blurred vision since admission. Pt demonstrates mild balance deficits requiring min guard to hand held assist for OOB mobility and min guard assist for standing ADL. Will follow acutely.     Follow Up Recommendations  Outpatient OT if vision issues persist    Equipment Recommendations  Tub/shower bench    Recommendations for Other Services       Precautions / Restrictions Precautions Precautions: Fall Precaution Comments: low vision and photophobia from previous ICH, focal loss in R field Restrictions Weight Bearing Restrictions: No      Mobility Bed Mobility Overal bed mobility: Modified Independent             General bed mobility comments: HOB up 25 degrees  Transfers Overall transfer level: Needs assistance Equipment used: 1 person hand held assist Transfers: Sit to/from Stand Sit to Stand: Supervision         General transfer comment: supervision for safety    Balance Overall balance assessment: Needs assistance   Sitting balance-Leahy Scale: Good     Standing balance support: No upper extremity supported;During functional activity Standing balance-Leahy Scale: Fair Standing balance comment: statically                           ADL either performed or assessed with clinical judgement   ADL                                               Vision Baseline Vision/History: (low vision) Patient Visual Report: Blurring of vision Additional Comments: pt with R focal blind spot longstanding, reports blurriness, cannot typically read small print     Perception     Praxis      Pertinent Vitals/Pain Pain Assessment: No/denies pain     Hand Dominance Right   Extremity/Trunk Assessment Upper Extremity Assessment Upper Extremity Assessment: LUE deficits/detail LUE Deficits / Details: longstanding shoulder limitations s/p replacement LUE Coordination: decreased gross motor(due to limited ROM)   Lower Extremity Assessment Lower Extremity Assessment: Defer to PT evaluation   Cervical / Trunk Assessment Cervical / Trunk Assessment: Normal   Communication Communication Communication: No difficulties   Cognition Arousal/Alertness: Awake/alert Behavior During Therapy: WFL for tasks assessed/performed Overall Cognitive Status: Within Functional Limits for tasks assessed                                     General Comments       Exercises     Shoulder Instructions      Home Living Family/patient expects to be discharged to:: Private residence Living Arrangements: Spouse/significant other Available Help at Discharge: Family;Available PRN/intermittently Type of Home: House Home Access: Stairs to  enter Entrance Stairs-Number of Steps: 3 Entrance Stairs-Rails: Right;Left;Can reach both Home Layout: One level     Bathroom Shower/Tub: Chief Strategy Officer: Standard     Home Equipment: Cane - single point;Cane - quad      Lives With: Spouse    Prior Functioning/Environment Level of Independence: Needs assistance  Gait / Transfers Assistance Needed: Used the quad cane most of the time.  ADL's / Homemaking Assistance Needed: Occasionally required assistance with stepping into the tub, sitting down on the edge of the tub, and washing. Required assist for dressing  from wife as well. L shoulder replacement about a year ago.             OT Problem List: Impaired balance (sitting and/or standing);Impaired vision/perception      OT Treatment/Interventions: Self-care/ADL training    OT Goals(Current goals can be found in the care plan section) Acute Rehab OT Goals Patient Stated Goal: Home and to start therapy [facility] OT Goal Formulation: With patient Time For Goal Achievement: 06/15/17 Potential to Achieve Goals: Good ADL Goals Pt Will Perform Grooming: with modified independence;standing Pt Will Perform Upper Body Dressing: with modified independence;sitting Pt Will Perform Lower Body Dressing: with modified independence;sit to/from stand Pt Will Transfer to Toilet: with modified independence;ambulating;regular height toilet Pt Will Perform Toileting - Clothing Manipulation and hygiene: sit to/from stand;with modified independence Additional ADL Goal #1: Pt will gather items necessary for ADL modified independently.  OT Frequency: Min 2X/week   Barriers to D/C:            Co-evaluation              AM-PAC PT "6 Clicks" Daily Activity     Outcome Measure Help from another person eating meals?: None Help from another person taking care of personal grooming?: A Little Help from another person toileting, which includes using toliet, bedpan, or urinal?: A Little Help from another person bathing (including washing, rinsing, drying)?: A Little Help from another person to put on and taking off regular upper body clothing?: None Help from another person to put on and taking off regular lower body clothing?: A Little 6 Click Score: 20   End of Session Equipment Utilized During Treatment: Gait belt  Activity Tolerance: Patient tolerated treatment well Patient left: in bed;with call bell/phone within reach  OT Visit Diagnosis: Unsteadiness on feet (R26.81);Low vision, both eyes (H54.2)                Time: 1610-9604 OT Time  Calculation (min): 31 min Charges:  OT General Charges $OT Visit: 1 Visit OT Evaluation $OT Eval Low Complexity: 1 Low OT Treatments $Self Care/Home Management : 8-22 mins G-Codes:     06/19/17 Martie Round, OTR/L Pager: [phone]  Iran Planas, [place] Bailiff 19-Jun-2017, 3:39 PM

## 2017-06-02 ENCOUNTER — Inpatient Hospital Stay (HOSPITAL_COMMUNITY): Payer: Medicare Other

## 2017-06-02 ENCOUNTER — Encounter (HOSPITAL_COMMUNITY): Payer: Self-pay | Admitting: Student

## 2017-06-02 HISTORY — PX: IR ANGIO INTRA EXTRACRAN SEL COM CAROTID INNOMINATE BILAT MOD SED: IMG5360

## 2017-06-02 HISTORY — PX: IR ANGIO VERTEBRAL SEL VERTEBRAL BILAT MOD SED: IMG5369

## 2017-06-02 HISTORY — PX: IR 3D INDEPENDENT WKST: IMG2385

## 2017-06-02 LAB — EXTRACTABLE NUCLEAR ANTIGEN ANTIBODY
ENA SM Ab Ser-aCnc: <0.2 AI (ref 0.0–0.9)
Ribonucleic Protein: <0.2 AI (ref 0.0–0.9)

## 2017-06-02 LAB — HEPATITIS PANEL, ACUTE
HEP B C IGM: NEGATIVE
Hep A IgM: NEGATIVE
Hepatitis B Surface Ag: NEGATIVE

## 2017-06-02 LAB — RHEUMATOID FACTOR: Rhuematoid fact SerPl-aCnc: <10 IU/mL (ref 0.0–13.9)

## 2017-06-02 LAB — ANCA TITERS
Atypical P-ANCA titer: <1:20 {titer}
C-ANCA: <1:20 {titer}
P-ANCA: <1:20 {titer}

## 2017-06-02 LAB — ANTI-JO 1 ANTIBODY, IGG: Anti JO-1: <0.2 AI (ref 0.0–0.9)

## 2017-06-02 MED ORDER — IOPAMIDOL (ISOVUE-300) INJECTION 61%
INTRAVENOUS | Status: AC
Start: 2017-06-02 — End: 2017-06-02
  Administered 2017-06-02: 10 mL
  Filled 2017-06-02: qty 50

## 2017-06-02 MED ORDER — SODIUM CHLORIDE 0.9 % IV SOLN
INTRAVENOUS | Status: DC
  Administered 2017-06-02: 19:00:00 via INTRAVENOUS

## 2017-06-02 MED ORDER — MIDAZOLAM HCL 2 MG/2ML IJ SOLN
INTRAMUSCULAR | Status: DC | PRN
  Administered 2017-06-02: 1 mg via INTRAVENOUS

## 2017-06-02 MED ORDER — FENTANYL CITRATE (PF) 100 MCG/2ML IJ SOLN
INTRAMUSCULAR | Status: DC | PRN
  Administered 2017-06-02: 25 ug via INTRAVENOUS

## 2017-06-02 MED ORDER — IOPAMIDOL (ISOVUE-300) INJECTION 61%
INTRAVENOUS | Status: AC
  Administered 2017-06-02: 21 mL
  Filled 2017-06-02: qty 100

## 2017-06-02 MED ORDER — FENTANYL CITRATE (PF) 100 MCG/2ML IJ SOLN
INTRAMUSCULAR | Status: AC
  Filled 2017-06-02: qty 2

## 2017-06-02 MED ORDER — HEPARIN SODIUM (PORCINE) 1000 UNIT/ML IJ SOLN
INTRAMUSCULAR | Status: AC
  Filled 2017-06-02: qty 1

## 2017-06-02 MED ORDER — LIDOCAINE HCL 1 % IJ SOLN
INTRAMUSCULAR | Status: AC
  Filled 2017-06-02: qty 20

## 2017-06-02 MED ORDER — MIDAZOLAM HCL 2 MG/2ML IJ SOLN
INTRAMUSCULAR | Status: AC
Start: 2017-06-02 — End: 2017-06-03
  Filled 2017-06-02: qty 2

## 2017-06-02 MED ORDER — LIDOCAINE HCL (PF) 1 % IJ SOLN
INTRAMUSCULAR | Status: DC | PRN
  Administered 2017-06-02: 10 mL

## 2017-06-02 MED ORDER — IOPAMIDOL (ISOVUE-300) INJECTION 61%
INTRAVENOUS | Status: AC
  Administered 2017-06-02: 70 mL
  Filled 2017-06-02: qty 150

## 2017-06-02 NOTE — Sedation Documentation (Signed)
Patient is resting comfortably. 

## 2017-06-02 NOTE — Sedation Documentation (Signed)
IT team continuing to hold pressure 

## 2017-06-02 NOTE — Sedation Documentation (Signed)
ED Provider at bedside. 

## 2017-06-02 NOTE — Progress Notes (Signed)
Patient off floor to IR.

## 2017-06-02 NOTE — Sedation Documentation (Signed)
O2/EtcO2 removed per verbal by Dr. Drusilla Kannereveshawar.

## 2017-06-02 NOTE — Consult Note (Signed)
Chief Complaint: Patient was seen in consultation today for intracranial hemorrhage.  Referring Physician(s): Micki Riley  Supervising Physician: Julieanne Cotton  Patient Status: [facility] - In-pt  History of Present Illness: Luis Johnson is a 62 y.o. male  Presented to ED 05/31/2017 for vision loss.  CT head 05/31/2017: 1. Small focus of acute subarachnoid blood in the right occipital lobe, along the right parieto-occipital sulcus. 2. No associated mass effect. 3. Severe chronic white matter disease and sequelae of prior bilateral occipital hemorrhages.  MRI/MRA brain/head 05/31/2017: 1. No acute infarct. 2. Redemonstration of small volume right occipital subarachnoid hemorrhage. 3. Extensive bilateral posterior hemisphere hemosiderin deposition related to remote hemorrhagic events. 4. No emergent large vessel occlusion. No vascular abnormality that would explain the reported vision loss. 5. Severe chronic ischemic microangiopathy. 6. 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  IR consulted by Dr. Pearlean Brownie for image-guided cerebral angiogram to determine cause of ICH. Patient is asymptomatic from hemorrhage, as vision loss is not a symptom of ICH. Denies fever, H/A, weakness, seizures, and syncope. Patient states he ate breakfast around 07:00 today, this is the last thing that he ate.  Past Medical History:  Diagnosis Date  . Hypertension   . Stroke [facility])     Past Surgical History:  Procedure Laterality Date  . SHOULDER SURGERY      Allergies: Eggs or egg-derived products  Medications: Prior to Admission medications   Medication Sig Start Date End Date Taking? Authorizing Provider  acetaminophen (TYLENOL) 325 MG tablet Take 1,000 mg by mouth 3 (three) times daily as needed for mild pain.    Yes [provider]  albuterol-ipratropium (COMBIVENT) 18-103 MCG/ACT inhaler Inhale 1 puff into the lungs every 4 (four) hours as needed for  wheezing or shortness of breath.   Yes [provider]  amLODipine (NORVASC) 10 MG tablet Take 10 mg by mouth daily.   Yes [provider]  atorvastatin (LIPITOR) 80 MG tablet Take 80 mg by mouth daily. 05/04/17  Yes [provider]  EPINEPHrine 0.3 mg/0.3 mL IJ SOAJ injection Inject 0.3 mg into the muscle daily as needed (anaphylaxis).    Yes [provider]  escitalopram (LEXAPRO) 10 MG tablet Take 10 mg by mouth daily.   Yes [provider]  QVAR 80 MCG/ACT inhaler INHALE 2 PUFFS TWICE DAILY 01/08/15  Yes [provider]  cephALEXin (KEFLEX) 500 MG capsule Take 1 capsule (500 mg total) by mouth 4 (four) times daily. Patient not taking: Reported on 05/31/2017 02/11/16   Lora Paula, MD     Family History  Problem Relation Age of Onset  . Seizures Mother   . Heart failure Mother   . Stroke Brother   . Heart attack Brother     Social History   Socioeconomic History  . Marital status: Married    Spouse name: Not on file  . Number of children: Not on file  . Years of education: Not on file  . Highest education level: Not on file  Occupational History  . Not on file  Social Needs  . Financial resource strain: Not on file  . Food insecurity:    Worry: Not on file    Inability: Not on file  . Transportation needs:    Medical: Not on file    Non-medical: Not on file  Tobacco Use  . Smoking status: Former Smoker    Last attempt to quit: 01/21/1994    Years since quitting: 23.3  .  Smokeless tobacco: Never Used  Substance and Sexual Activity  . Alcohol use: No  . Drug use: No  . Sexual activity: Not on file  Lifestyle  . Physical activity:    Days per week: Not on file    Minutes per session: Not on file  . Stress: Not on file  Relationships  . Social connections:    Talks on phone: Not on file    Gets together: Not on file    Attends religious service: Not on file    Active member of club or organization: Not on  file    Attends meetings of clubs or organizations: Not on file    Relationship status: Not on file  Other Topics Concern  . Not on file  Social History Narrative  . Not on file     Review of Systems: A 12 point ROS discussed and pertinent positives are indicated in the HPI above.  All other systems are negative.  Review of Systems  Constitutional: Negative for activity change and fever.  Eyes: Positive for visual disturbance.  Respiratory: Negative for shortness of breath and wheezing.   Cardiovascular: Negative for chest pain and palpitations.  Neurological: Negative for seizures, syncope, weakness, numbness and headaches.  Psychiatric/Behavioral: Negative for behavioral problems and confusion.    Vital Signs: BP (!) 144/94 (BP Location: Right Arm)   Pulse 82   Temp 97.7 F (36.5 C) (Oral)   Resp 16   Ht 5\' 7"  (1.702 m)   Wt 161 lb 6 oz (73.2 kg)   SpO2 97%   BMI 25.28 kg/m   Physical Exam  Constitutional: He is oriented to person, place, and time. He appears well-developed and well-nourished. No distress.  Cardiovascular: Normal rate, regular rhythm, normal heart sounds and intact distal pulses.  No murmur heard. Pulmonary/Chest: Effort normal and breath sounds normal. He has no wheezes.  Neurological: He is alert and oriented to person, place, and time.  Skin: Skin is warm and dry.  Psychiatric: He has a normal mood and affect. His behavior is normal. Judgment and thought content normal.  Nursing note and vitals reviewed.   MD Evaluation Airway: WNL Heart: WNL Abdomen: WNL Chest/ Lungs: WNL ASA  Classification: 2 Mallampati/Airway Score: One   Imaging: Mr Shirlee Latch ZO Contrast  Result Date: 05/31/2017 CLINICAL DATA:  Bilateral vision loss and subarachnoid hemorrhage. EXAM: MRI HEAD WITHOUT CONTRAST MRA HEAD WITHOUT CONTRAST TECHNIQUE: Multiplanar, multiecho pulse sequences of the brain and surrounding structures were obtained without intravenous contrast.  Angiographic images of the head were obtained using MRA technique without contrast. COMPARISON:  Head CT 05/31/2017 the Brain MRI 11/11/2014 FINDINGS: MRI HEAD FINDINGS Brain: The midline structures are normal. No acute infarct. Small amount of hyperintensity on diffusion-weighted imaging in the right occipital lobe likely corresponds to the small amount of subarachnoid blood seen on the concomitant CT. No mass lesion, hydrocephalus, dural abnormality or extra-axial collection. Diffuse confluent hyperintense T2-weighted signal within the periventricular, deep and juxtacortical white matter, most commonly due to chronic ischemic microangiopathy. No age-advanced or lobar predominant atrophy. Extensive hemosiderin deposition within both posterior hemispheres, left-greater-than-right. Vascular: Major intracranial arterial and venous sinus flow voids are preserved. Skull and upper cervical spine: The visualized skull base, calvarium, upper cervical spine and extracranial soft tissues are normal. Sinuses/Orbits: No fluid levels or advanced mucosal thickening. No mastoid or middle ear effusion. Normal orbits. MRA HEAD FINDINGS Intracranial internal carotid arteries: Normal. Anterior cerebral arteries: Normal. Middle cerebral arteries: Right MCA bifurcation aneurysm  measures approximately 3 mm, unchanged from 11/11/2014. Posterior communicating arteries: Absent bilaterally. Posterior cerebral arteries: Normal. Basilar artery: Normal. Vertebral arteries: Codominant.  Normal. Superior cerebellar arteries: Normal. Anterior inferior cerebellar arteries: Normal. Posterior inferior cerebellar arteries: Normal. IMPRESSION: 1. No acute infarct. 2. Redemonstration of small volume right occipital subarachnoid hemorrhage. 3. Extensive bilateral posterior hemisphere hemosiderin deposition related to remote hemorrhagic events. 4. No emergent large vessel occlusion. No vascular abnormality that would explain the reported vision loss. 5.  Severe chronic ischemic microangiopathy. 6. 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 Electronically Signed   By: Deatra Robinson M.D.   On: 05/31/2017 19:48   Mr Brain Wo Contrast  Result Date: 05/31/2017 CLINICAL DATA:  Bilateral vision loss and subarachnoid hemorrhage. EXAM: MRI HEAD WITHOUT CONTRAST MRA HEAD WITHOUT CONTRAST TECHNIQUE: Multiplanar, multiecho pulse sequences of the brain and surrounding structures were obtained without intravenous contrast. Angiographic images of the head were obtained using MRA technique without contrast. COMPARISON:  Head CT 05/31/2017 the Brain MRI 11/11/2014 FINDINGS: MRI HEAD FINDINGS Brain: The midline structures are normal. No acute infarct. Small amount of hyperintensity on diffusion-weighted imaging in the right occipital lobe likely corresponds to the small amount of subarachnoid blood seen on the concomitant CT. No mass lesion, hydrocephalus, dural abnormality or extra-axial collection. Diffuse confluent hyperintense T2-weighted signal within the periventricular, deep and juxtacortical white matter, most commonly due to chronic ischemic microangiopathy. No age-advanced or lobar predominant atrophy. Extensive hemosiderin deposition within both posterior hemispheres, left-greater-than-right. Vascular: Major intracranial arterial and venous sinus flow voids are preserved. Skull and upper cervical spine: The visualized skull base, calvarium, upper cervical spine and extracranial soft tissues are normal. Sinuses/Orbits: No fluid levels or advanced mucosal thickening. No mastoid or middle ear effusion. Normal orbits. MRA HEAD FINDINGS Intracranial internal carotid arteries: Normal. Anterior cerebral arteries: Normal. Middle cerebral arteries: Right MCA bifurcation aneurysm measures approximately 3 mm, unchanged from 11/11/2014. Posterior communicating arteries: Absent bilaterally. Posterior cerebral arteries: Normal. Basilar artery: Normal.  Vertebral arteries: Codominant.  Normal. Superior cerebellar arteries: Normal. Anterior inferior cerebellar arteries: Normal. Posterior inferior cerebellar arteries: Normal. IMPRESSION: 1. No acute infarct. 2. Redemonstration of small volume right occipital subarachnoid hemorrhage. 3. Extensive bilateral posterior hemisphere hemosiderin deposition related to remote hemorrhagic events. 4. No emergent large vessel occlusion. No vascular abnormality that would explain the reported vision loss. 5. Severe chronic ischemic microangiopathy. 6. 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 Electronically Signed   By: Deatra Robinson M.D.   On: 05/31/2017 19:48   Ct Head Code Stroke Wo Contrast  Result Date: 05/31/2017 CLINICAL DATA:  Code stroke.  Blurred vision EXAM: CT HEAD WITHOUT CONTRAST TECHNIQUE: Contiguous axial images were obtained from the base of the skull through the vertex without intravenous contrast. COMPARISON:  Brain MRI 11/11/2014 and head CT 11/12/2014. FINDINGS: Brain: Small focus of acute subarachnoid hemorrhage along the superior aspect of the right parieto-occipital sulcus. No associated mass effect. There is encephalomalacia of the left occipital lobe at the site of remote hemorrhage. There is periventricular hypoattenuation compatible with chronic microvascular disease. The hemorrhagic focus measures 6 mm. Vascular: No hyperdense vessel or unexpected vascular calcification. Skull: Normal visualized skull base, calvarium and extracranial soft tissues. Sinuses/Orbits: No sinus fluid levels or advanced mucosal thickening. No mastoid effusion. Normal orbits. IMPRESSION: 1. Small focus of acute subarachnoid blood in the right occipital lobe, along the right parieto-occipital sulcus. 2. No associated mass effect. 3. Severe chronic white matter disease and sequelae of prior bilateral  occipital hemorrhages. Critical Value/emergent results were called by telephone at the time of  interpretation on 05/31/2017 at 6:41 pm to Dr. Wilford CornerArora, who verbally acknowledged these results. Electronically Signed   By: Deatra RobinsonKevin  Herman M.D.   On: 05/31/2017 18:43    Labs:  CBC: Recent Labs    05/31/17 1929 05/31/17 1938  WBC 5.3  --   HGB 14.8 16.7  HCT 44.7 49.0  PLT 270  --     COAGS: Recent Labs    05/31/17 1929  INR 0.94  APTT 26    BMP: Recent Labs    05/31/17 1929 05/31/17 1938  NA 139 142  K 4.0 3.9  CL 106 105  CO2 24  --   GLUCOSE 93 89  BUN 10 13  CALCIUM 9.2  --   CREATININE 0.80 0.80  GFRNONAA >60  --   GFRAA >60  --     LIVER FUNCTION TESTS: Recent Labs    05/31/17 1929  BILITOT 0.4  AST 23  ALT 25  ALKPHOS 70  PROT 8.1  ALBUMIN 3.9    TUMOR MARKERS: No results for input(s): AFPTM, CEA, CA199, CHROMGRNA in the last 8760 hours.  Assessment and Plan:  Intracranial hemorrhage. CT/MRI/MRA head demonstrates ICH. Plan for image-guided diagnostic cerebral angiogram today to determine cause of ICH. Patient is NPO, he last ate breakfast at 07:00 today. He is not on blood thinners. Denies fever. WBCs WNL.  Risks and benefits of cerebral angiogram were discussed with the patient including, but not limited to bleeding, infection, vascular injury or contrast induced renal failure. This interventional procedure involves the use of X-rays and because of the nature of the planned procedure, it is possible that we will have prolonged use of X-ray fluoroscopy. Potential radiation risks to you include (but are not limited to) the following: - A slightly elevated risk for cancer  several years later in life. This risk is typically less than 0.5% percent. This risk is low in comparison to the normal incidence of human cancer, which is 33% for women and 50% for men according to the [employer] Society. - Radiation induced injury can include skin redness, resembling a rash, tissue breakdown / ulcers and hair loss (which can be temporary or permanent).    The likelihood of either of these occurring depends on the difficulty of the procedure and whether you are sensitive to radiation due to previous procedures, disease, or genetic conditions.  IF your procedure requires a prolonged use of radiation, you will be notified and given written instructions for further action.  It is your responsibility to monitor the irradiated area for the 2 weeks following the procedure and to notify your physician if you are concerned that you have suffered a radiation induced injury.   All of the patient's questions were answered, patient is agreeable to proceed. Consent signed and in chart.  Thank you for this interesting consult.  I greatly enjoyed meeting Luis Johnson and look forward to participating in their care.  A copy of this report was sent to the requesting provider on this date.  Electronically Signed: Elwin MochaAlexandra Chardai Gangemi, PA-C 06/02/2017, 10:43 AM   I spent a total of 30 mintues  in face to face in clinical consultation, greater than 50% of which was counseling/coordinating care for intracranial hemorrhage.

## 2017-06-02 NOTE — Progress Notes (Signed)
Pt transferred to room on [address], groin stable, checked with RN, pulses strong, instructions given post-groin access. IR team signing off.

## 2017-06-02 NOTE — Progress Notes (Signed)
  PROGRESS NOTE  Antionette Polesony Domzalski ZOX:[id number] DOB: 1955/06/08 DOA: 05/31/2017 PCP: Marva PandaMillsaps, Kimberly, NP  Brief Narrative: 62 year old man PMH essential hypertension, ICH left parietal occipital, right parietal occipital areas 2016, ICH involving right parietal lobe April 28, 2017, presented to the emergency department with headache, bilateral visual loss.  Seen by neurology emergently, stat CT head showed small focus acute subarachnoid blood right occipital lobe, severe chronic white matter disease.  Assessment/Plan Acute vision loss in a patient with history of PRES and multiple strokes.  Differential includes vasculitides, CAA, RCVS.  Speech therapy recommends no further evaluation.  Physical therapy recommends outpatient PT. --Reportedly improving.  Continue IV Solu-Medrol day 2 of 3 as per stroke service.  Autoimmune serologies thus far appear to be unremarkable.   -- appreciate vascular surgery consultation.  Plan for bilateral temporal artery biopsy 3/22.   Intracranial hemorrhage, MRI showed small focus acute subarachnoid blood right occipital lobe, right parietal occipital sulcus. --Remains asymptomatic.  Keep SBP less than 140  Recent admission for nontraumatic subcortical hemorrhage right cerebral hemisphere, PRES, last seen by neurology 2/20 follow-up after total vision loss left eye, followed by right eye, subsequently both resolved.  Essential hypertension --Stable.  Keep systolic blood pressure less than 140  COPD --Remains stable.  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 --Avoid aspirin, heparin  Anticipate discharge next 48 hours if continues to improve.  DVT prophylaxis: SCDs Code Status: full  Family Communication: none Disposition Plan: home with outpatient OT, PT   Brendia Sacksaniel Javi Bollman, MD  [facility] Direct contact: [phone] --Via amion app OR  --[email or web address]; password [email or web address]  7PM-7AM  contact night coverage as above 06/02/2017, 7:46 AM  LOS: 2 days   Consultants:  Neurology  Vascular surgery  Procedures:    Antimicrobials:    Interval history/Subjective: Feels better today.  Vision has improved.  Peripheral vision deficit persists.  Still some numbness in left arm.  Objective: Vitals:  Vitals:   06/02/17 0052 06/02/17 0427  BP: 136/76 140/89  Pulse: 81 89  Resp: 18 18  Temp: 98.1 F (36.7 C) 97.7 F (36.5 C)  SpO2: 95% 94%    Exam: Constitutional:   . Appears calm and comfortable Eyes:  . pupils and irises appear normal Respiratory:  . CTA bilaterally, no w/r/r.  . Respiratory effort normal Cardiovascular:  . RRR, no m/r/g . No LE extremity edema   Psychiatric:  . Mental status o Mood, affect appropriate  I have personally reviewed the following:   Labs:  Hepatitis panel negative.  HIV nonreactive.  Rheumatoid arthritis latex negative.  Imaging studies:  MRI brain no acute infarct.  Small volume right occipital subarachnoid hemorrhage.  Extensive bilateral posterior hemisphere hemosiderin deposition related to remote hemorrhagic infarcts.  No emergent large vessel occlusion.  Severe chronic ischemic microangiopathy.  Medical tests:    Scheduled Meds: . amLODipine  10 mg Oral Daily  . atorvastatin  80 mg Oral Daily  . budesonide (PULMICORT) nebulizer solution  0.25 mg Nebulization BID  . escitalopram  10 mg Oral Daily  . ipratropium-albuterol  3 mL Inhalation TID   Continuous Infusions: . methylPREDNISolone (SOLU-MEDROL) injection Stopped (06/01/17 1342)    Principal Problem:   Vision loss Active Problems:   ICH (intracerebral hemorrhage) (HCC)   HTN (hypertension)   COPD (chronic obstructive pulmonary disease) (HCC)   Depression   HLD (hyperlipidemia)   LOS: 2 days

## 2017-06-02 NOTE — Sedation Documentation (Signed)
IR team holding pressure R groin

## 2017-06-02 NOTE — Procedures (Signed)
S/P 4 vessel cerebral arteriogram R5t CFA approach. Findings. 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

## 2017-06-02 NOTE — Sedation Documentation (Signed)
Patient denies pain and is resting comfortably.  

## 2017-06-02 NOTE — Plan of Care (Signed)
  Problem: Education: Goal: Knowledge of disease or condition will improve Outcome: Progressing Goal: Knowledge of secondary prevention will improve Outcome: Progressing Goal: Knowledge of patient specific risk factors addressed and post discharge goals established will improve Outcome: Progressing   Problem: Education: Goal: Knowledge of General Education information will improve Outcome: Progressing   Problem: Health Behavior/Discharge Planning: Goal: Ability to manage health-related needs will improve Outcome: Progressing   Problem: Clinical Measurements: Goal: Ability to maintain clinical measurements within normal limits will improve Outcome: Progressing Goal: Will remain free from infection Outcome: Progressing Goal: Diagnostic test results will improve Outcome: Progressing Goal: Respiratory complications will improve Outcome: Progressing Goal: Cardiovascular complication will be avoided Outcome: Progressing   Problem: Activity: Goal: Risk for activity intolerance will decrease Outcome: Progressing   Problem: Nutrition: Goal: Adequate nutrition will be maintained Outcome: Progressing   Problem: Coping: Goal: Level of anxiety will decrease Outcome: Progressing   Problem: Elimination: Goal: Will not experience complications related to bowel motility Outcome: Progressing Goal: Will not experience complications related to urinary retention Outcome: Progressing   Problem: Pain Managment: Goal: General experience of comfort will improve Outcome: Progressing   Problem: Safety: Goal: Ability to remain free from injury will improve Outcome: Progressing   Problem: Skin Integrity: Goal: Risk for impaired skin integrity will decrease Outcome: Progressing

## 2017-06-02 NOTE — Progress Notes (Addendum)
NEUROHOSPITALISTS STROKE TEAM - DAILY PROGRESS NOTE   SUBJECTIVE (INTERVAL HISTORY) Patient recognized Dr. Leonie Johnson upon entering the room. He states his vision abnormality has resolved. Dr. Rory Johnson also saw him and was pleased with his recovery. Etiology of vision loss and resultant resolution unclear. Dr. Leonie Johnson discussed plans for cerebral angio and B temporal artery bx with both patient and Dr. Sarajane Johnson. No new abnormal findings.  Laboratory Results  HIV, hep panel, RPR, RF, anti-Jo 1 ab, Extractable nuclear ab. CRP. ESR. TSH - all neg or WNL ANCA titers pending  Physical Examination   Vitals:   06/02/17 0427 06/02/17 0910 06/02/17 0923 06/02/17 0928  BP: 140/89 (!) 144/94    Pulse: 89 83 82   Resp: '18 16 16   ' Temp: 97.7 F (36.5 C) 97.7 F (36.5 C)    TempSrc: Oral Oral    SpO2: 94% 99% 97% 97%  Weight:      Height:       General - Well nourished, well developed middle aged african [place] male, in no apparent distress HEENT-  Normocephalic,  Full visual fields. Cardiovascular - Regular rate and rhythm  Respiratory - Lungs clear bilaterally. No wheezing. Abdomen - soft and non-tender, BS normal Extremities- no edema or cyanosis  Neurological Examination  Mental Status -  Level of arousal and orientation to time, place, and person were intact. Language including expression, naming, repetition, comprehension was assessed and found intact. Attention span and concentration were normal Recent and remote memory were intact Fund of Knowledge was assessed and was intact Cranial Nerves II - XII - II - visual fields full, counts fingers in all fields III, IV, VI - Extraocular movements intact. V - Facial sensation intact bilaterally. VII - Facial movement intact bilaterally VIII - Hearing & vestibular intact bilaterally X - Palate elevates symmetrically XI - Chin turning & shoulder shrug intact bilaterally. XII - Tongue  protrusion intact Motor Strength - The patient's strength was normal in all extremities and pronator drift was absent.  Bulk was normal and fasciculations were absent .mild weakness of left grip and diminished fine finger movements on the left which is old from prior stroke Motor Tone - Muscle tone was assessed at the neck and appendages and was normal Reflexes - The patient's reflexes were symmetrical in all extremities and he had no pathological reflexes Sensory - Light touch was assessed and was symmetrical Coordination - The patient had normal movements in the hands and feet with no ataxia or dysmetria.  Tremor was absent Gait and Station - deferred.  Imaging Results  Ct Head Code Stroke Wo Contrast  05/31/2017 1. Small focus of acute subarachnoid blood in the right occipital lobe, along the right parieto-occipital sulcus.  2. No associated mass effect.  3. Severe chronic white matter disease and sequelae of prior bilateral occipital hemorrhages.   Luis Brain Wo Contrast  05/31/2017 Luis Johnson Head Wo Contrast 05/31/2017 1. No acute infarct.  2. Redemonstration of small volume right occipital subarachnoid hemorrhage.  3. Extensive bilateral posterior hemisphere hemosiderin deposition related to remote hemorrhagic events.  4. No emergent large vessel occlusion. No vascular abnormality that would explain the reported vision loss.  5. Severe chronic ischemic microangiopathy.  6. 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  EKG:  SR with atrial enlargement Echocardiogram:                                              NA B/L Carotid U/S:                                                NA  IMPRESSION AND PLAN  Luis Johnson is a 62 y.o. male with PMH of HTN, recurrent ICH and multiple posterior microhemorrhages who presented with sudden B vision loss and HA, similar to previous presentations of hemorrhage.       Bilateral vision loss, resolved, etiology unclear.  MRI:  cerebral white matter disese with extensive hemosiderin deposition; small right occipital hemorrhagic hemorrhage. No acute infarcts recently hospitalized on February 14 at [facility] for similar sx, MRI with enhancing L parietal lesion Treating with high dose steroids:  IV Solu-Medrol 1000 mg x 3 days (2/3 today)  Rule out temporal arteritis. For B temporal artery bx scheduled for tomorrow w/ Dr. Donnetta Johnson Cerebral angiography today at 2p F/u ANCA results. Other Autoimmune, infectious serologies neg  R occipital SAH incidental MRI R occipital SAH. not related to presenting sx SBP goal < 140 prevent worsening intracranial hemorrhage. BP 144 this am. RN made aware to recheck and treat if needed. 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  PRES not felt to be consistent with presentation nor hx  Hyperlipidemia. LDL 103. On lipitor 80. Continue.   Essential hypertension. Keep SBP < 140   No antiplatelet at this time. consider starting aspirin in the next several days, depending on test resvults   Hospital day # 2   Disposition:   Therapy Recs:               Outpatient PT and OT if vision deficits persist. NO SLP reccomendations Home Equipment:         3 in 1 vs Tub bench  Follow up Appointments  Follow Up:  Follow up with stroke clinic NP (Luis Johnson or Luis Johnson, if both not available, consider Dr. Antony Johnson, Dr. Bess Johnson, or Dr. Sarina Johnson) at [facility] in about 4 weeks.   Luis Sabin, NP Stroke Neurology Team 06/02/2017, 9:59 AM  06/02/2017 ATTENDING ASSESSMENT     He has shown remarkable improvement in his vision which would be a surprising with only one dose of Solu-Medrol exact etiology of vision loss still remains unclear but recommend continue  three-day course of Solu-Medrol. Check cerebral catheter angiogram today to look for any dural AVM or cerebral vasculitis. Bilateral temporal artery biopsy scheduled for tomorrow. Plan to patient and is willing. Discussed with Dr. Sarajane Johnson and Dr. Estanislado Pandy. Greater than 50% time during this 25 minute visit was spent on counseling and coordination of care about his vision loss, intracerebral hemorrhageand 9 for diagnosis and answering questions Luis Contras, MD Medical Director [place] Pager: [phone] 06/02/2017 1:12 PM     To contact Stroke Provider, please refer to [email or web address] After hours, contact General Neurology

## 2017-06-02 NOTE — Sedation Documentation (Signed)
Vital signs stable. 

## 2017-06-03 ENCOUNTER — Inpatient Hospital Stay (HOSPITAL_COMMUNITY): Payer: Medicare Other | Admitting: Certified Registered Nurse Anesthetist

## 2017-06-03 ENCOUNTER — Encounter (HOSPITAL_COMMUNITY)
Admission: EM | Disposition: A | Discharge status: Home / Self Care | Payer: Self-pay | Source: Home / Self Care | Attending: Family Medicine

## 2017-06-03 ENCOUNTER — Encounter (HOSPITAL_COMMUNITY): Payer: Self-pay | Admitting: Orthopedic Surgery

## 2017-06-03 DIAGNOSIS — R51 Headache: Secondary | ICD-10-CM

## 2017-06-03 HISTORY — PX: ARTERY BIOPSY: SHX891

## 2017-06-03 LAB — GLUCOSE, CAPILLARY: Glucose-Capillary: 142 mg/dL — ABNORMAL HIGH (ref 65–99)

## 2017-06-03 SURGERY — BIOPSY TEMPORAL ARTERY
Anesthesia: General | Site: Face | Laterality: Bilateral

## 2017-06-03 MED ORDER — PROPOFOL 10 MG/ML IV BOLUS
INTRAVENOUS | Status: DC | PRN
  Administered 2017-06-03: 160 mg via INTRAVENOUS
  Administered 2017-06-03: 40 mg via INTRAVENOUS

## 2017-06-03 MED ORDER — PANTOPRAZOLE SODIUM 40 MG PO TBEC: 40.0000 mg | DELAYED_RELEASE_TABLET | Freq: Every day | ORAL | 1 refills | Status: DC

## 2017-06-03 MED ORDER — DEXAMETHASONE SODIUM PHOSPHATE 10 MG/ML IJ SOLN
INTRAMUSCULAR | Status: DC | PRN
  Administered 2017-06-03: 5 mg via INTRAVENOUS

## 2017-06-03 MED ORDER — MIDAZOLAM HCL 5 MG/5ML IJ SOLN
INTRAMUSCULAR | Status: DC | PRN
  Administered 2017-06-03: 2 mg via INTRAVENOUS

## 2017-06-03 MED ORDER — LIDOCAINE-EPINEPHRINE (PF) 1 %-1:200000 IJ SOLN
INTRAMUSCULAR | Status: AC
  Filled 2017-06-03: qty 30

## 2017-06-03 MED ORDER — FENTANYL CITRATE (PF) 100 MCG/2ML IJ SOLN: 25.0000 ug | INTRAMUSCULAR | Status: DC | PRN

## 2017-06-03 MED ORDER — OXYCODONE-ACETAMINOPHEN 5-325 MG PO TABS: 1.0000 | ORAL_TABLET | Freq: Four times a day (QID) | ORAL | Status: DC | PRN

## 2017-06-03 MED ORDER — PHENYLEPHRINE 40 MCG/ML (10ML) SYRINGE FOR IV PUSH (FOR BLOOD PRESSURE SUPPORT)
PREFILLED_SYRINGE | INTRAVENOUS | Status: DC | PRN
  Administered 2017-06-03: 80 ug via INTRAVENOUS

## 2017-06-03 MED ORDER — PROPOFOL 10 MG/ML IV BOLUS
INTRAVENOUS | Status: AC
  Filled 2017-06-03: qty 20

## 2017-06-03 MED ORDER — LIDOCAINE-EPINEPHRINE 0.5 %-1:200000 IJ SOLN
INTRAMUSCULAR | Status: AC
  Filled 2017-06-03: qty 1

## 2017-06-03 MED ORDER — LIDOCAINE HCL (CARDIAC) 20 MG/ML IV SOLN
INTRAVENOUS | Status: AC
  Filled 2017-06-03: qty 5

## 2017-06-03 MED ORDER — MIDAZOLAM HCL 2 MG/2ML IJ SOLN
INTRAMUSCULAR | Status: AC
  Filled 2017-06-03: qty 2

## 2017-06-03 MED ORDER — FENTANYL CITRATE (PF) 250 MCG/5ML IJ SOLN
INTRAMUSCULAR | Status: AC
  Filled 2017-06-03: qty 5

## 2017-06-03 MED ORDER — PHENYLEPHRINE 40 MCG/ML (10ML) SYRINGE FOR IV PUSH (FOR BLOOD PRESSURE SUPPORT)
PREFILLED_SYRINGE | INTRAVENOUS | Status: AC
  Filled 2017-06-03: qty 10

## 2017-06-03 MED ORDER — ONDANSETRON HCL 4 MG/2ML IJ SOLN
INTRAMUSCULAR | Status: DC | PRN
  Administered 2017-06-03: 4 mg via INTRAVENOUS

## 2017-06-03 MED ORDER — PROMETHAZINE HCL 25 MG/ML IJ SOLN: 6.2500 mg | INTRAMUSCULAR | Status: DC | PRN

## 2017-06-03 MED ORDER — FENTANYL CITRATE (PF) 250 MCG/5ML IJ SOLN
INTRAMUSCULAR | Status: DC | PRN
  Administered 2017-06-03: 50 ug via INTRAVENOUS
  Administered 2017-06-03 (×4): 25 ug via INTRAVENOUS

## 2017-06-03 MED ORDER — CEFAZOLIN SODIUM-DEXTROSE 2-3 GM-%(50ML) IV SOLR
INTRAVENOUS | Status: DC | PRN
  Administered 2017-06-03: 2 g via INTRAVENOUS

## 2017-06-03 MED ORDER — CEFAZOLIN SODIUM 1 G IJ SOLR
INTRAMUSCULAR | Status: AC
  Filled 2017-06-03: qty 20

## 2017-06-03 MED ORDER — PREDNISONE 10 MG PO TABS: ORAL_TABLET | ORAL | 0 refills | Status: AC

## 2017-06-03 MED ORDER — 0.9 % SODIUM CHLORIDE (POUR BTL) OPTIME
TOPICAL | Status: DC | PRN
  Administered 2017-06-03: 1000 mL

## 2017-06-03 MED ORDER — DEXAMETHASONE SODIUM PHOSPHATE 10 MG/ML IJ SOLN
INTRAMUSCULAR | Status: AC
  Filled 2017-06-03: qty 1

## 2017-06-03 MED ORDER — ONDANSETRON HCL 4 MG/2ML IJ SOLN
INTRAMUSCULAR | Status: AC
  Filled 2017-06-03: qty 2

## 2017-06-03 MED ORDER — LACTATED RINGERS IV SOLN: INTRAVENOUS | Status: DC

## 2017-06-03 MED ORDER — LIDOCAINE 2% (20 MG/ML) 5 ML SYRINGE
INTRAMUSCULAR | Status: DC | PRN
  Administered 2017-06-03: 60 mg via INTRAVENOUS

## 2017-06-03 SURGICAL SUPPLY — 44 items
CANISTER SUCT 3000ML PPV (MISCELLANEOUS) ×3 IMPLANT
CONT SPEC 4OZ CLIKSEAL STRL BL (MISCELLANEOUS) ×5 IMPLANT
COTTONBALL LRG STERILE PKG (GAUZE/BANDAGES/DRESSINGS) ×1 IMPLANT
COVER SURGICAL LIGHT HANDLE (MISCELLANEOUS) ×3 IMPLANT
DECANTER SPIKE VIAL GLASS SM (MISCELLANEOUS) ×3 IMPLANT
DERMABOND ADVANCED (GAUZE/BANDAGES/DRESSINGS) ×2
DERMABOND ADVANCED .7 DNX12 (GAUZE/BANDAGES/DRESSINGS) ×1 IMPLANT
DRAPE LAPAROTOMY T 102X78X121 (DRAPES) ×3 IMPLANT
ELECT REM PT RETURN 9FT ADLT (ELECTROSURGICAL) ×3
ELECTRODE REM PT RTRN 9FT ADLT (ELECTROSURGICAL) ×1 IMPLANT
GLOVE BIO SURGEON STRL SZ 6.5 (GLOVE) ×1 IMPLANT
GLOVE BIO SURGEON STRL SZ7.5 (GLOVE) ×2 IMPLANT
GLOVE BIO SURGEONS STRL SZ 6.5 (GLOVE) ×1
GLOVE BIOGEL PI IND STRL 7.5 (GLOVE) IMPLANT
GLOVE BIOGEL PI INDICATOR 7.5 (GLOVE) ×2
GLOVE SS BIOGEL STRL SZ 7.5 (GLOVE) ×1 IMPLANT
GLOVE SUPERSENSE BIOGEL SZ 7.5 (GLOVE)
GLOVE SURG SS PI 6.5 STRL IVOR (GLOVE) ×2 IMPLANT
GOWN STRL REUS W/ TWL LRG LVL3 (GOWN DISPOSABLE) ×3 IMPLANT
GOWN STRL REUS W/TWL LRG LVL3 (GOWN DISPOSABLE) ×6
KIT BASIN OR (CUSTOM PROCEDURE TRAY) ×3 IMPLANT
KIT ROOM TURNOVER OR (KITS) ×3 IMPLANT
LOOP VESSEL MAXI BLUE (MISCELLANEOUS) ×3 IMPLANT
LOOP VESSEL MINI RED (MISCELLANEOUS) ×2 IMPLANT
NDL HYPO 25GX1X1/2 BEV (NEEDLE) ×1 IMPLANT
NEEDLE HYPO 25GX1X1/2 BEV (NEEDLE) ×3 IMPLANT
NS IRRIG 1000ML POUR BTL (IV SOLUTION) ×3 IMPLANT
PACK GENERAL/GYN (CUSTOM PROCEDURE TRAY) ×3 IMPLANT
PAD ARMBOARD 7.5X6 YLW CONV (MISCELLANEOUS) ×6 IMPLANT
SPONGE LAP 4X18 X RAY DECT (DISPOSABLE) ×1 IMPLANT
SUT MNCRL AB 4-0 PS2 18 (SUTURE) ×4 IMPLANT
SUT PROLENE 6 0 BV (SUTURE) ×2 IMPLANT
SUT PROLENE 7 0 BV 1 (SUTURE) ×2 IMPLANT
SUT SILK 3 0 (SUTURE) ×2
SUT SILK 3-0 18XBRD TIE 12 (SUTURE) ×1 IMPLANT
SUT SILK 4 0 (SUTURE) ×2
SUT SILK 4 0 TIES 17X18 (SUTURE) ×1 IMPLANT
SUT SILK 4-0 18XBRD TIE 12 (SUTURE) IMPLANT
SUT VIC AB 3-0 SH 27 (SUTURE) ×4
SUT VIC AB 3-0 SH 27X BRD (SUTURE) ×1 IMPLANT
SUT VICRYL 4-0 PS2 18IN ABS (SUTURE) ×3 IMPLANT
SYR CONTROL 10ML LL (SYRINGE) ×3 IMPLANT
TOWEL GREEN STERILE (TOWEL DISPOSABLE) ×3 IMPLANT
WATER STERILE IRR 1000ML POUR (IV SOLUTION) IMPLANT

## 2017-06-03 NOTE — Progress Notes (Signed)
Occupational Therapy Treatment Patient Details Name: Luis Johnson MRN: [id number] DOB: 05/23/1955 Today's Date: 06/03/2017    History of present illness Pt is a 62 y/o male who presents with headache, LLE weakness and vision changes. Imaging revealed no acute infarct, however pt has had prior ICH x2. Most recent in feb 2019, with residual vision deficits.    OT comments  Educated pt/wife on home safety and compensatory strategies for low vision. Pt states he has been driving. Encouraged pt to refrain from driving until he has a full field visual assessment completed by his eye doctor. Wife verbalized understanding. Continue to recommend pt follow up with OT at the neuro outpt center to facilitae return to PLOF.   Follow Up Recommendations  Outpatient OT    Equipment Recommendations  Tub/shower bench    Recommendations for Other Services      Precautions / Restrictions Precautions Precautions: Fall Precaution Comments: Low vision from prior ICH; appears to have some degree of vision loss to the R/R lower quadrant.  Restrictions Weight Bearing Restrictions: No       Mobility Bed Mobility Overal bed mobility: Modified Independent             General bed mobility comments: OOB in recliner on entry  Transfers Overall transfer level: Modified independent Equipment used: None Transfers: Sit to/from Stand Sit to Stand: Modified independent (Device/Increase time)         General transfer comment: supervision for safety    Balance Overall balance assessment: No apparent balance deficits (not formally assessed) Sitting-balance support: Feet supported;No upper extremity supported Sitting balance-Leahy Scale: Good     Standing balance support: No upper extremity supported;During functional activity Standing balance-Leahy Scale: Good Standing balance comment: dynamically                           ADL either performed or assessed with clinical judgement   ADL  Overall ADL's : At baseline                                             Vision   Vision Assessment?: Vision impaired- to be further tested in functional context Additional Comments: visual field cut   Perception     Praxis      Cognition Arousal/Alertness: Awake/alert Behavior During Therapy: WFL for tasks assessed/performed Overall Cognitive Status: History of cognitive impairments - at baseline - pt has white matter disease                                          Exercises Exercises: Other exercises Other Exercises Other Exercises: visual scanning activities Other Exercises: eye hand coordination activities Other Exercises: "[email or web address]" website   Shoulder Instructions       General Comments wife present during session     Pertinent Vitals/ Pain       Pain Assessment: 0-10 Pain Score: 3  Pain Location: Headache Pain Descriptors / Indicators: Discomfort Pain Intervention(s): Limited activity within patient's tolerance  Home Living                                          Prior  Functioning/Environment              Frequency  Min 2X/week        Progress Toward Goals  OT Goals(current goals can now be found in the care plan section)  Progress towards OT goals: Progressing toward goals  Acute Rehab OT Goals Patient Stated Goal: Home and to start therapy when [facility] OT Goal Formulation: With patient Time For Goal Achievement: 06/15/17 Potential to Achieve Goals: Good ADL Goals Pt Will Perform Grooming: with modified independence;standing Pt Will Perform Upper Body Dressing: with modified independence;sitting Pt Will Perform Lower Body Dressing: with modified independence;sit to/from stand Pt Will Transfer to Toilet: with modified independence;ambulating;regular height toilet Pt Will Perform Toileting - Clothing Manipulation and hygiene: sit to/from stand;with modified  independence Additional ADL Goal #1: Pt will gather items necessary for ADL modified independently.  Plan Discharge plan remains appropriate    Co-evaluation                 AM-PAC PT "6 Clicks" Daily Activity     Outcome Measure   Help from another person eating meals?: None Help from another person taking care of personal grooming?: None Help from another person toileting, which includes using toliet, bedpan, or urinal?: A Little Help from another person bathing (including washing, rinsing, drying)?: A Little Help from another person to put on and taking off regular upper body clothing?: None Help from another person to put on and taking off regular lower body clothing?: A Little 6 Click Score: 21    End of Session    OT Visit Diagnosis: Unsteadiness on feet (R26.81);Low vision, both eyes (H54.2)   Activity Tolerance Patient tolerated treatment well   Patient Left in bed;with call bell/phone within reach   Nurse Communication Mobility status        Time: 1610-9604 OT Time Calculation (min): 15 min  Charges: OT General Charges $OT Visit: 1 Visit OT Treatments $Therapeutic Activity: 8-22 mins  [facility], OT/L  [phone] 06/03/2017   [name],[name] 06/03/2017, 4:34 PM

## 2017-06-03 NOTE — Progress Notes (Signed)
Patient ready for discharge; discharge instructions given and reviewed; Rx's given; patient discharged out via wheelchair.

## 2017-06-03 NOTE — Progress Notes (Signed)
Pt to IR. VSS. NAD. Report called. Patient NPO. Consent signed.

## 2017-06-03 NOTE — Care Management Important Message (Signed)
Important Message  Patient Details  Name: Antionette Polesony Tillison MRN: [id number] Date of Birth: 1956/02/04   Medicare Important Message Given:  Yes    [place] Stefan ChurchBratton 06/03/2017, 4:10 PM

## 2017-06-03 NOTE — Progress Notes (Signed)
Called to see pt for swelling around one of his temporal artery bx incisions.  Both incisions are clean and dry without swelling.  May continue ice pack as needed.   Doreatha MassedSamantha Cala Kruckenberg, [facility] 06/03/2017 2:22 PM

## 2017-06-03 NOTE — Op Note (Signed)
    OPERATIVE NOTE   PROCEDURE: 1. Bilateral temporal artery biopsies  PRE-OPERATIVE DIAGNOSIS: concerns for temporal arteritis  POST-OPERATIVE DIAGNOSIS: same as above   SURGEON: Leonides SakeBrian Chen, MD  ANESTHESIA: general  ESTIMATED BLOOD LOSS: 50 cc  FINDING(S): 1.  No frankly abnormal findings intraoperatively  SPECIMEN(S):  Right: 3 cm, Left: 2 cm  INDICATIONS:   Luis Johnson is a 62 y.o. male who presents with bilateral vision changes in the setting intra-cranial bleeding.  Bilateral temporal artery biopsy was requested.  Dr. Darrick PennaFields felt this was indicated, so I agreed to proceed forward with the bilateral temporal artery biopsy.  Risks include but are not limited to: bleeding, infection, and non-diagnostic sampling.  DESCRIPTION: After obtaining full informed written consent, the patient was brought back to the operating room and placed supine upon the operating table.  The patient received IV antibiotics prior to induction.  A procedure time out was completed and the correct surgical site was verified.  After obtaining adequate anesthesia, the patient was prepped and draped in the standard fashion for: bilateral temporal artery biopsies.    I turned my attention to the right temporal region.  I could easily palpate a temporal artery pulse.  I marked the route of the artery and then made an incision with a 11-blade.  I using electrocautery, I dissected down to the artery.  I tied off a 3 cm segment of artery proximally and distally with 3-0 silk.  I sharply transected a segment of the temporal artery.  There was no bleeding in the surgical field.  I reapproximated the subcutaneous tissue with a running stitch of 3-0 Vicryl.  The skin was reapproximated with a running subcuticular stitch of 4-0 Monocryl.  The skin was cleaned, dried, and reinforced with Dermabond.  I then turned my attention to the left temporal region.  I repeated the same steps as on the right side, harvesting a 2 cm  segment of artery.  The subcutaneous tissue and skin were reapproximated as with the left side.  The specimens were passed off the field.   COMPLICATIONS: none  CONDITION: stable   Leonides SakeBrian Chen, MD, [facility] [place] Office: [phone] Pager: [phone]  06/03/2017, 11:10 AM

## 2017-06-03 NOTE — Progress Notes (Signed)
Physical Therapy Treatment Patient Details Name: Luis Johnson MRN: [id number] DOB: 20-Jun-1955 Today's Date: 06/03/2017    History of Present Illness Pt is a 62 y/o male who presents with headache, LLE weakness and vision changes. Imaging revealed no acute infarct, however pt has had prior ICH x2. Most recent in feb 2019, with residual vision deficits.     PT Comments    Pt is making good progress towards his goals, however continues to have some visual deficits, and decreased L LE strength. Pt is feeling better and very anxious to go home. Pt currently supervision to min guard for ambulation and ascent/descent of 3 steps with bilateral rails. Pt d/c plans remain appropriate at this time.    Follow Up Recommendations  Outpatient PT;Supervision for mobility/OOB     Equipment Recommendations  3in1 (PT)(vs tub bench - will defer to OT)       Precautions / Restrictions Precautions Precautions: Fall Precaution Comments: Low vision from prior ICH; appears to have some degree of vision loss to the R/R lower quadrant.  Restrictions Weight Bearing Restrictions: No    Mobility  Bed Mobility               General bed mobility comments: OOB in recliner on entry  Transfers Overall transfer level: Needs assistance Equipment used: None Transfers: Sit to/from Stand Sit to Stand: Modified independent (Device/Increase time)         General transfer comment: supervision for safety  Ambulation/Gait Ambulation/Gait assistance: Min guard Ambulation Distance (Feet): 250 Feet Assistive device: None Gait Pattern/deviations: Step-through pattern;Decreased step length - left;Decreased dorsiflexion - left;Decreased weight shift to left Gait velocity: Decreased Gait velocity interpretation: Below normal speed for age/gender General Gait Details: supervision for ambulation without AD, decreased cadence as ambulation progressed no change in stability   Stairs Stairs: Yes   Stair  Management: Two rails;Forwards;Alternating pattern Number of Stairs: 5 General stair comments: steady ascent/descent of steps with use of handrails  Wheelchair Mobility    Modified Rankin (Stroke Patients Only) Modified Rankin (Stroke Patients Only) Pre-Morbid Rankin Score: No significant disability Modified Rankin: No significant disability     Balance Overall balance assessment: Needs assistance Sitting-balance support: Feet supported;No upper extremity supported Sitting balance-Leahy Scale: Good     Standing balance support: No upper extremity supported;During functional activity Standing balance-Leahy Scale: Good Standing balance comment: dynamically                            Cognition Arousal/Alertness: Awake/alert Behavior During Therapy: WFL for tasks assessed/performed Overall Cognitive Status: Within Functional Limits for tasks assessed                                           General Comments General comments (skin integrity, edema, etc.): wife present during session       Pertinent Vitals/Pain Pain Assessment: 0-10 Pain Score: 6  Pain Location: Headache Pain Descriptors / Indicators: Headache Pain Intervention(s): Premedicated before session;Monitored during session;Limited activity within patient's tolerance           PT Goals (current goals can now be found in the care plan section) Acute Rehab PT Goals Patient Stated Goal: Home and to start [facility] PT Goal Formulation: With patient Time For Goal Achievement: 06/08/17 Potential to Achieve Goals: Good Progress towards PT goals: Progressing toward goals  Frequency    Min 3X/week      PT Plan Current plan remains appropriate       AM-PAC PT "6 Clicks" Daily Activity  Outcome Measure  Difficulty turning over in bed (including adjusting bedclothes, sheets and blankets)?: None Difficulty moving from lying on back to sitting on the side of  the bed? : None Difficulty sitting down on and standing up from a chair with arms (e.g., wheelchair, bedside commode, etc,.)?: A Little Help needed moving to and from a bed to chair (including a wheelchair)?: A Little Help needed walking in hospital room?: A Little Help needed climbing 3-5 steps with a railing? : A Little 6 Click Score: 20    End of Session Equipment Utilized During Treatment: Gait belt Activity Tolerance: Patient tolerated treatment well Patient left: in chair;with call bell/phone within reach Nurse Communication: Mobility status PT Visit Diagnosis: Unsteadiness on feet (R26.81);Other symptoms and signs involving the nervous system (K44.010(R29.898)     Time: 1520-1540 PT Time Calculation (min) (ACUTE ONLY): 20 min  Charges:  $Gait Training: 8-22 mins                    G Codes:       Jceon Alverio B. Beverely RisenVan Fleet PT, DPT Acute Rehabilitation  [phone] Pager [phone]     Elon Alaslizabeth B Van Fleet 06/03/2017, 4:19 PM

## 2017-06-03 NOTE — Discharge Summary (Signed)
Physician Discharge Summary  Luis Johnson YQM:[id number] DOB: Aug 01, 1955 DOA: 05/31/2017  PCP: [facility]  Admit date: 05/31/2017 Discharge date: 06/03/2017  Recommendations for Outpatient Follow-up:   Acute vision loss in a patient with history of PRES and multiple strokes. Differential includes vasculitides, CAA, RCVS.  Speech therapy recommends no further evaluation.  Physical therapy recommends outpatient PT. Status post angiogram 3/21 and bilateral temporal artery biopsies today.  Final recommendations from neurology as below. --Neurology recommended discharge on 60 mg prednisone daily, reduce to 50 mg in 2 weeks, 40 mg in 4 weeks. --Follow-up with neurology for discussion of temporal artery biopsy results within the next 4 weeks. --Will need close outpatient follow-up with PCP while on steroids.  Random blood sugar acceptable on high-dose steroids.  Do not anticipate any issues.  Intracranial hemorrhage, MRI showed small focus acute subarachnoid blood right occipital lobe, right parietal occipital sulcus. --Asymptomatic.  Keep SBP less than 140  Essential hypertension --Remains stable.  Keep systolic blood pressure less than 140    Follow-up Information    [facility] Follow up in 4 week(s).   Specialty:  Neurology Why:  stroke clinic. office will call with appt date and time Contact information: [address] [place] [address] [phone]       [facility] Schedule an appointment as soon as possible for a visit in 1 week(s).   Contact information: [address] [facility] [place] [address] [phone]        [facility] Follow up.   Specialty:  Rehabilitation Why:  They will contact you for the first appointment Contact information: [phone] mc [place] [address] [phone]            Discharge Diagnoses:  1. Acute vision loss 2. Intracranial hemorrhage 3. Essential hypertension 4. COPD 5. Hyperlipidemia  Discharge Condition: improved Disposition: home  Diet recommendation: Heart healthy diet  Filed Weights   05/31/17 2300 06/03/17 0854  Weight: 73.2 kg (161 lb 6 oz) 73.2 kg (161 lb 6 oz)    History of present illness:  62 year old man PMH essential hypertension, ICH left parietal occipital, right parietal occipital areas 2016, ICH involving right parietal lobe April 28, 2017, presented to the emergency department with headache, bilateral visual loss.  Seen by neurology emergently, stat CT head showed small focus acute subarachnoid blood right occipital lobe, severe chronic white matter disease.  Hospital Course:  Patient was seen by neurology, etiology for vision loss remains unclear, some concern for temporal arteritis and therefore neurology ordered 3 days of high-dose IV Solu-Medrol.  Patient's vision is dramatically improved and returning towards baseline.  Neurology also recommended an angiogram.  Their final recommendations at this point is to treat with prednisone pending results of biopsy.  More specifics below.  Long-term treatment is tight blood pressure control.  Individual issues as below.  Acute vision loss in a patient with history of PRES and multiple strokes. Differential includes vasculitides, CAA, RCVS.  Speech therapy recommends no further evaluation.  Physical therapy recommends outpatient PT. Status post angiogram 3/21 and bilateral temporal artery biopsies 3/22.  Final recommendations from neurology as below. --Finish IV Solu-Medrol today.  Neurology discharged on 60 mg prednisone daily, reduce to 50 mg in 2 weeks, 40 mg in 4 weeks. --Follow-up with neurology for discussion of results within the next 4 weeks. --Will need close outpatient follow-up with PCP while on steroids  Intracranial hemorrhage, MRI showed small focus  acute  subarachnoid blood right occipital lobe, right parietal occipital sulcus. --Asymptomatic.  Keep SBP less than 140  Essential hypertension --Remains stable.  Keep systolic blood pressure less than 140  COPD --Stable.  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 --Avoid aspirin, heparin   Consultants:  Neurology  Vascular surgery  Procedures:  3/21 CEREBRAL ANGIOGRAM  3/22 Bilateral temporal artery biopsies  Today's assessment: S: Feels well.  Vision nearly back to baseline.  Wants to go home. O: Vitals:  Vitals:   06/03/17 1410 06/03/17 1551  BP: 140/76 (!) 147/75  Pulse: 89 96  Resp:  18  Temp:  98.2 F (36.8 C)  SpO2:  100%    Constitutional:  . Appears calm and comfortable Eyes:  . pupils and irises appear normal Respiratory:  . CTA bilaterally, no w/r/r.  . Respiratory effort normal Cardiovascular:  . RRR, no m/r/g Psychiatric:  . Mental status o Mood, affect appropriate   Discharge Instructions  Discharge Instructions    Ambulatory referral to Neurology   Complete by:  As directed    Follow up with stroke clinic NP (Jessica Vanschaick or Darrol Angel, if both not available, consider Dr. Delia Heady, Dr. Jamelle Rushing, or Dr. Naomie Dean) at [facility] in about 4 weeks.   Ambulatory referral to Occupational Therapy   Complete by:  As directed    Ambulatory referral to Physical Therapy   Complete by:  As directed    Diet - low sodium heart healthy   Complete by:  As directed    Discharge instructions   Complete by:  As directed    Call your physician or seek immediate medical attention for visual changes, weakness, numbness, difficulty speaking or swallowing.   Discharge wound care:   Complete by:  As directed    Per vascular surgeon   Increase activity slowly   Complete by:  As directed      Allergies as of 06/03/2017      Reactions   Eggs Or  Egg-derived Products Swelling   SWELLING REACTION UNSPECIFIED       Medication List    STOP taking these medications   cephALEXin 500 MG capsule Commonly known as:  KEFLEX     TAKE these medications   acetaminophen 325 MG tablet Commonly known as:  TYLENOL Take 1,000 mg by mouth 3 (three) times daily as needed for mild pain.   albuterol-ipratropium 18-103 MCG/ACT inhaler Commonly known as:  COMBIVENT Inhale 1 puff into the lungs every 4 (four) hours as needed for wheezing or shortness of breath.   amLODipine 10 MG tablet Commonly known as:  NORVASC Take 10 mg by mouth daily.   atorvastatin 80 MG tablet Commonly known as:  LIPITOR Take 80 mg by mouth daily.   EPINEPHrine 0.3 mg/0.3 mL Soaj injection Commonly known as:  EPI-PEN Inject 0.3 mg into the muscle daily as needed (anaphylaxis).   escitalopram 10 MG tablet Commonly known as:  LEXAPRO Take 10 mg by mouth daily.   pantoprazole 40 MG tablet Commonly known as:  PROTONIX Take 1 tablet (40 mg total) by mouth daily.   predniSONE 10 MG tablet Commonly known as:  DELTASONE Take 6 tablets (60 mg total) by mouth daily with breakfast for 14 days, THEN 5 tablets (50 mg total) daily with breakfast for 14 days, THEN 4 tablets (40 mg total) daily with breakfast for 14 days. Start taking on:  06/03/2017   QVAR 80 MCG/ACT inhaler  Generic drug:  beclomethasone INHALE 2 PUFFS TWICE DAILY            Durable Medical Equipment  (From admission, onward)        Start     Ordered   06/03/17 1606  For home use only DME 3 n 1  Once     06/03/17 1606       Discharge Care Instructions  (From admission, onward)        Start     Ordered   06/03/17 0000  Discharge wound care:    Comments:  Per vascular surgeon   06/03/17 1557     Allergies  Allergen Reactions  . Eggs Or Egg-Derived Products Swelling    SWELLING REACTION UNSPECIFIED     The results of significant diagnostics from this hospitalization (including  imaging, microbiology, ancillary and laboratory) are listed below for reference.    Significant Diagnostic Studies: Mr Shirlee LatchMra Head ZHWo Contrast  Result Date: 05/31/2017 CLINICAL DATA:  Bilateral vision loss and subarachnoid hemorrhage. EXAM: MRI HEAD WITHOUT CONTRAST MRA HEAD WITHOUT CONTRAST TECHNIQUE: Multiplanar, multiecho pulse sequences of the brain and surrounding structures were obtained without intravenous contrast. Angiographic images of the head were obtained using MRA technique without contrast. COMPARISON:  Head CT 05/31/2017 the Brain MRI 11/11/2014 FINDINGS: MRI HEAD FINDINGS Brain: The midline structures are normal. No acute infarct. Small amount of hyperintensity on diffusion-weighted imaging in the right occipital lobe likely corresponds to the small amount of subarachnoid blood seen on the concomitant CT. No mass lesion, hydrocephalus, dural abnormality or extra-axial collection. Diffuse confluent hyperintense T2-weighted signal within the periventricular, deep and juxtacortical white matter, most commonly due to chronic ischemic microangiopathy. No age-advanced or lobar predominant atrophy. Extensive hemosiderin deposition within both posterior hemispheres, left-greater-than-right. Vascular: Major intracranial arterial and venous sinus flow voids are preserved. Skull and upper cervical spine: The visualized skull base, calvarium, upper cervical spine and extracranial soft tissues are normal. Sinuses/Orbits: No fluid levels or advanced mucosal thickening. No mastoid or middle ear effusion. Normal orbits. MRA HEAD FINDINGS Intracranial internal carotid arteries: Normal. Anterior cerebral arteries: Normal. Middle cerebral arteries: Right MCA bifurcation aneurysm measures approximately 3 mm, unchanged from 11/11/2014. Posterior communicating arteries: Absent bilaterally. Posterior cerebral arteries: Normal. Basilar artery: Normal. Vertebral arteries: Codominant.  Normal. Superior cerebellar arteries:  Normal. Anterior inferior cerebellar arteries: Normal. Posterior inferior cerebellar arteries: Normal. IMPRESSION: 1. No acute infarct. 2. Redemonstration of small volume right occipital subarachnoid hemorrhage. 3. Extensive bilateral posterior hemisphere hemosiderin deposition related to remote hemorrhagic events. 4. No emergent large vessel occlusion. No vascular abnormality that would explain the reported vision loss. 5. Severe chronic ischemic microangiopathy. 6. 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 Electronically Signed   By: Deatra RobinsonKevin  Herman M.D.   On: 05/31/2017 19:48   Mr Brain Wo Contrast  Result Date: 05/31/2017 CLINICAL DATA:  Bilateral vision loss and subarachnoid hemorrhage. EXAM: MRI HEAD WITHOUT CONTRAST MRA HEAD WITHOUT CONTRAST TECHNIQUE: Multiplanar, multiecho pulse sequences of the brain and surrounding structures were obtained without intravenous contrast. Angiographic images of the head were obtained using MRA technique without contrast. COMPARISON:  Head CT 05/31/2017 the Brain MRI 11/11/2014 FINDINGS: MRI HEAD FINDINGS Brain: The midline structures are normal. No acute infarct. Small amount of hyperintensity on diffusion-weighted imaging in the right occipital lobe likely corresponds to the small amount of subarachnoid blood seen on the concomitant CT. No mass lesion, hydrocephalus, dural abnormality or extra-axial collection. Diffuse confluent hyperintense T2-weighted signal within the periventricular,  deep and juxtacortical white matter, most commonly due to chronic ischemic microangiopathy. No age-advanced or lobar predominant atrophy. Extensive hemosiderin deposition within both posterior hemispheres, left-greater-than-right. Vascular: Major intracranial arterial and venous sinus flow voids are preserved. Skull and upper cervical spine: The visualized skull base, calvarium, upper cervical spine and extracranial soft tissues are normal. Sinuses/Orbits: No fluid  levels or advanced mucosal thickening. No mastoid or middle ear effusion. Normal orbits. MRA HEAD FINDINGS Intracranial internal carotid arteries: Normal. Anterior cerebral arteries: Normal. Middle cerebral arteries: Right MCA bifurcation aneurysm measures approximately 3 mm, unchanged from 11/11/2014. Posterior communicating arteries: Absent bilaterally. Posterior cerebral arteries: Normal. Basilar artery: Normal. Vertebral arteries: Codominant.  Normal. Superior cerebellar arteries: Normal. Anterior inferior cerebellar arteries: Normal. Posterior inferior cerebellar arteries: Normal. IMPRESSION: 1. No acute infarct. 2. Redemonstration of small volume right occipital subarachnoid hemorrhage. 3. Extensive bilateral posterior hemisphere hemosiderin deposition related to remote hemorrhagic events. 4. No emergent large vessel occlusion. No vascular abnormality that would explain the reported vision loss. 5. Severe chronic ischemic microangiopathy. 6. 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 Electronically Signed   By: Deatra Robinson M.D.   On: 05/31/2017 19:48   Ct Head Code Stroke Wo Contrast  Result Date: 05/31/2017 CLINICAL DATA:  Code stroke.  Blurred vision EXAM: CT HEAD WITHOUT CONTRAST TECHNIQUE: Contiguous axial images were obtained from the base of the skull through the vertex without intravenous contrast. COMPARISON:  Brain MRI 11/11/2014 and head CT 11/12/2014. FINDINGS: Brain: Small focus of acute subarachnoid hemorrhage along the superior aspect of the right parieto-occipital sulcus. No associated mass effect. There is encephalomalacia of the left occipital lobe at the site of remote hemorrhage. There is periventricular hypoattenuation compatible with chronic microvascular disease. The hemorrhagic focus measures 6 mm. Vascular: No hyperdense vessel or unexpected vascular calcification. Skull: Normal visualized skull base, calvarium and extracranial soft tissues. Sinuses/Orbits:  No sinus fluid levels or advanced mucosal thickening. No mastoid effusion. Normal orbits. IMPRESSION: 1. Small focus of acute subarachnoid blood in the right occipital lobe, along the right parieto-occipital sulcus. 2. No associated mass effect. 3. Severe chronic white matter disease and sequelae of prior bilateral occipital hemorrhages. Critical Value/emergent results were called by telephone at the time of interpretation on 05/31/2017 at 6:41 pm to Dr. Wilford Corner, who verbally acknowledged these results. Electronically Signed   By: Deatra Robinson M.D.   On: 05/31/2017 18:43     Labs: Basic Metabolic Panel: Recent Labs  Lab 05/31/17 1929 05/31/17 1938  NA 139 142  K 4.0 3.9  CL 106 105  CO2 24  --   GLUCOSE 93 89  BUN 10 13  CREATININE 0.80 0.80  CALCIUM 9.2  --    Liver Function Tests: Recent Labs  Lab 05/31/17 1929  AST 23  ALT 25  ALKPHOS 70  BILITOT 0.4  PROT 8.1  ALBUMIN 3.9   CBC: Recent Labs  Lab 05/31/17 1929 05/31/17 1938  WBC 5.3  --   NEUTROABS 4.0  --   HGB 14.8 16.7  HCT 44.7 49.0  MCV 82.2  --   PLT 270  --     CBG: Recent Labs  Lab 05/31/17 1808 06/03/17 1550  GLUCAP 83 142*    Principal Problem:   Vision loss Active Problems:   ICH (intracerebral hemorrhage) (HCC)   HTN (hypertension)   COPD (chronic obstructive pulmonary disease) (HCC)   Depression   HLD (hyperlipidemia)   Time coordinating discharge: 35 minutes  Signed:  Brendia Sacks, MD  [facility] 06/03/2017, 4:11 PM

## 2017-06-03 NOTE — Interval H&P Note (Signed)
History and Physical Interval Note:  06/03/2017 9:35 AM  Luis Johnson  has presented today for surgery, with the diagnosis of headache  The various methods of treatment have been discussed with the patient and family. After consideration of risks, benefits and other options for treatment, the patient has consented to  Procedure(s): BIOPSY TEMPORAL ARTERY (Bilateral) as a surgical intervention .  The patient's history has been reviewed, patient examined, no change in status, stable for surgery.  I have reviewed the patient's chart and labs.  Questions were answered to the patient's satisfaction.     Leonides SakeBrian Chen

## 2017-06-03 NOTE — Progress Notes (Signed)
NEUROHOSPITALISTS STROKE TEAM - DAILY PROGRESS NOTE   SUBJECTIVE (INTERVAL HISTORY) Family at bedside. He states his vision abnormality has resolved. Etiology of vision loss and resultant resolution unclear. Dr. Leonie Johnson discussed plans for cerebral angio and B temporal artery bx with both patient and Dr. Sarajane Johnson, both have been completed. No new abnormal findings. He feels well.   Laboratory Results  HIV, hep panel, RPR, RF, anti-Jo 1 ab, Extractable nuclear ab. CRP. ESR. TSH - all neg or WNL ANCA titers pending  Physical Examination   Vitals:   06/03/17 1140 06/03/17 1145 06/03/17 1159 06/03/17 1225  BP: 124/75  134/78 (!) 112/95  Pulse: 89  86 82  Resp: 10   18  Temp:  98.2 F (36.8 C)  98.6 F (37 C)  TempSrc:    Oral  SpO2: 92%  96% 94%  Weight:      Height:       General - Well nourished, well developed middle aged african [place] male, in no apparent distress HEENT-  Normocephalic,  Full visual fields. Cardiovascular - Regular rate and rhythm  Respiratory - Lungs clear bilaterally. No wheezing. Abdomen - soft and non-tender, BS normal Extremities- no edema or cyanosis  Neurological Examination  Mental Status -  Level of arousal and orientation to time, place, and person were intact. Language including expression, naming, repetition, comprehension was assessed and found intact. Attention span and concentration were normal Recent and remote memory were intact Fund of Knowledge was assessed and was intact Cranial Nerves II - XII - II - visual fields full, counts fingers in all fields III, IV, VI - Extraocular movements intact. V - Facial sensation intact bilaterally. VII - Facial movement intact bilaterally VIII - Hearing & vestibular intact bilaterally X - Palate elevates symmetrically XI - Chin turning & shoulder shrug intact bilaterally. XII - Tongue protrusion intact Motor Strength - The patient's strength  was normal in all extremities and pronator drift was absent.  Bulk was normal and fasciculations were absent .mild weakness of left grip and diminished fine finger movements on the left which is old from prior stroke Motor Tone - Muscle tone was assessed at the neck and appendages and was normal Reflexes - The patient's reflexes were symmetrical in all extremities and he had no pathological reflexes Sensory - Light touch was assessed and was symmetrical Coordination - The patient had normal movements in the hands and feet with no ataxia or dysmetria.  Tremor was absent Gait and Station - deferred.  Imaging Results  Ct Head Code Stroke Wo Contrast  05/31/2017 1. Small focus of acute subarachnoid blood in the right occipital lobe, along the right parieto-occipital sulcus.  2. No associated mass effect.  3. Severe chronic white matter disease and sequelae of prior bilateral occipital hemorrhages.   Mr Brain Wo Contrast  05/31/2017 Mr Luis Johnson Head Wo Contrast 05/31/2017 1. No acute infarct.  2. Redemonstration of small volume right occipital subarachnoid hemorrhage.  3. Extensive bilateral posterior hemisphere hemosiderin deposition related to remote hemorrhagic events.  4. No emergent large vessel occlusion. No vascular abnormality that would explain the reported vision loss.  5. Severe chronic ischemic microangiopathy.  6. 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  EKG:  SR with atrial enlargement Echocardiogram:                                              NA B/L Carotid U/S:                                                NA  IMPRESSION AND PLAN  [name] is a 62 y.o. male with PMH of HTN, recurrent ICH and multiple posterior microhemorrhages who presented with sudden B vision loss and HA, similar to previous presentations of hemorrhage.     Bilateral vision loss, resolved, etiology unclear.  MRI:   cerebral white matter disese with extensive hemosiderin deposition; small right occipital hemorrhagic hemorrhage. No acute infarcts recently hospitalized on February 14 at [facility] for similar sx, MRI with enhancing L parietal lesion Treating with high dose steroids:  IV Solu-Medrol 1000 mg x 3 days (2/3 today) should be discharged on 55m daily, reduce to 56min 2 weeks and 4027mt 4 weeks.  pending biopsy results and needs to be seen within 4 weeks to discuss results and follow taper of steroids. Needs close management with pcp outpatient while on steroids to monitor blood pressure and glucose.  Rule out temporal arteritis. For B temporal artery bx scheduled for tomorrow w/ Dr. EarDonnetta Hutchingrebral angiography today at 2p F/u ANCA results. Other Autoimmune, infectious serologies neg  R occipital SAH incidental MRI R occipital SAH. not related to presenting sx SBP goal < 140 prevent worsening intracranial hemorrhage. BP 144 this am. RN made aware to recheck and treat if needed. 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  PRES not felt to be consistent with presentation nor hx  Hyperlipidemia. LDL 103. On lipitor 80. Continue.   Essential hypertension. Keep SBP < 140   No antiplatelet at this time. consider starting aspirin in the next several days, depending on test resvults   Hospital day # 3   Disposition:   Therapy Recs:               Outpatient PT and OT if vision deficits persist. NO SLP reccomendations Home Equipment:         3 in 1 vs Tub bench  Follow up Appointments  Follow Up:  Follow up with stroke clinic NP (Luis Johnson or CarCecille Rubinf both not available, consider Dr. PraAntony Contrasr. Luis Harvestr [facility] about 4 weeks.   ShaBurnetta SabinP Stroke Neurology Team 06/03/2017, 12:36  PM  06/03/2017 ATTENDING ASSESSMENT     Treating with high dose steroids:  IV Solu-Medrol 1000 mg x 3 days (2/3 today) should be discharged on 26m38mily, reduce to 50mg78m2 weeks and 40mg 62m weeks.  pending biopsy results and needs to be seen within 4 weeks to discuss results and follow taper of steroids. Needs close management with pcp outpatient while on steroids to monitor blood pressure and glucose.   Personally examined patient and images, and have participated in and made any corrections needed to history, physical, neuro exam,assessment and plan as stated above.  I have personally obtained the history, evaluated lab date, reviewed imaging studies and agree with radiology interpretations.   Stroke will sign off at this time.   Luis Ill, MD Stroke Neurology    To contact Stroke Provider, please refer to [email or web address] After hours, contact General Neurology

## 2017-06-03 NOTE — Anesthesia Preprocedure Evaluation (Signed)
Anesthesia Evaluation  Patient identified by MRN, date of birth, ID band Patient awake    Reviewed: Allergy & Precautions, NPO status , Patient's Chart, lab work & pertinent test results  Airway Mallampati: II  TM Distance: >3 FB Neck ROM: Full    Dental   Pulmonary COPD, former smoker,    breath sounds clear to auscultation       Cardiovascular hypertension, Pt. on medications  Rhythm:Regular Rate:Normal     Neuro/Psych Depression CVA    GI/Hepatic negative GI ROS, Neg liver ROS,   Endo/Other  negative endocrine ROS  Renal/GU negative Renal ROS     Musculoskeletal   Abdominal   Peds  Hematology negative hematology ROS (+)   Anesthesia Other Findings   Reproductive/Obstetrics                             Lab Results  Component Value Date   WBC 5.3 05/31/2017   HGB 16.7 05/31/2017   HCT 49.0 05/31/2017   MCV 82.2 05/31/2017   PLT 270 05/31/2017   Lab Results  Component Value Date   CREATININE 0.80 05/31/2017   BUN 13 05/31/2017   NA 142 05/31/2017   K 3.9 05/31/2017   CL 105 05/31/2017   CO2 24 05/31/2017    Anesthesia Physical Anesthesia Plan  ASA: III  Anesthesia Plan: General   Post-op Pain Management:    Induction: Intravenous  PONV Risk Score and Plan: 2 and Ondansetron, Dexamethasone and Treatment may vary due to age or medical condition  Airway Management Planned: LMA  Additional Equipment:   Intra-op Plan:   Post-operative Plan: Extubation in OR  Informed Consent: I have reviewed the patients History and Physical, chart, labs and discussed the procedure including the risks, benefits and alternatives for the proposed anesthesia with the patient or authorized representative who has indicated his/her understanding and acceptance.   Dental advisory given  Plan Discussed with: CRNA  Anesthesia Plan Comments:         Anesthesia Quick Evaluation

## 2017-06-03 NOTE — Care Management Note (Signed)
Case Management Note  Patient Details  Name: Luis Johnson MRN: [id number] Date of Birth: 1955/05/28  Subjective/Objective:                    Action/Plan: Pt discharging home with self care. CM consulted for outpatient therapy. CM met with the patient and he would like to attend at [facility]. Orders in epic and information on the AVS.  Pt with orders for 3 in 1. Jermaine with [facility] notified and will deliver to the room.  Pt has transportation home and supervision at home.   Expected Discharge Date:  06/03/17               Expected Discharge Plan:  OP Rehab  In-House Referral:     Discharge planning Services  CM Consult  Post Acute Care Choice:  Durable Medical Equipment Choice offered to:  Patient  DME Arranged:  3-N-1 DME Agency:  [place]:    [facility] Agency:     Status of Service:  Completed, signed off  If discussed at [place] of Stay Meetings, dates discussed:    Additional Comments:  Pollie Friar, RN 06/03/2017, 4:11 PM

## 2017-06-03 NOTE — Anesthesia Procedure Notes (Signed)
Procedure Name: LMA Insertion Date/Time: 06/03/2017 10:21 AM Performed by: Army FossaPulliam, Orine Goga Dane, CRNA Pre-anesthesia Checklist: Patient identified, Emergency Drugs available, Suction available and Patient being monitored Patient Re-evaluated:Patient Re-evaluated prior to induction Oxygen Delivery Method: Circle System Utilized Preoxygenation: Pre-oxygenation with 100% oxygen Induction Type: IV induction Ventilation: Mask ventilation without difficulty LMA: LMA inserted LMA Size: 4.0 Number of attempts: 2 (Attempt X 1 and unable to acheive adequate seal.  Removed LMA and repositioned.  ) Airway Equipment and Method: Bite block Placement Confirmation: positive ETCO2 Tube secured with: Tape Dental Injury: Teeth and Oropharynx as per pre-operative assessment

## 2017-06-03 NOTE — Anesthesia Postprocedure Evaluation (Signed)
Anesthesia Post Note  Patient: Luis Johnson  Procedure(s) Performed: BILATERAL TEMPORAL ARTERY BIOPSY (Bilateral Face)     Patient location during evaluation: PACU Anesthesia Type: General Level of consciousness: awake and alert Pain management: pain level controlled Vital Signs Assessment: post-procedure vital signs reviewed and stable Respiratory status: spontaneous breathing, nonlabored ventilation, respiratory function stable and patient connected to nasal cannula oxygen Cardiovascular status: blood pressure returned to baseline and stable Postop Assessment: no apparent nausea or vomiting Anesthetic complications: no    Last Vitals:  Vitals Value Taken Time  BP    Temp    Pulse    Resp    SpO2      Last Pain:  Vitals:   06/03/17 1145  TempSrc:   PainSc: 0-No pain                 Kennieth RadFitzgerald, Juanice Warburton E

## 2017-06-03 NOTE — Transfer of Care (Signed)
Immediate Anesthesia Transfer of Care Note  Patient: Luis Johnson  Procedure(s) Performed: BILATERAL TEMPORAL ARTERY BIOPSY (Bilateral Face)  Patient Location: PACU  Anesthesia Type:General  Level of Consciousness: drowsy and patient cooperative  Airway & Oxygen Therapy: Patient Spontanous Breathing and Patient connected to face mask oxygen  Post-op Assessment: Report given to RN, Post -op Vital signs reviewed and stable and Patient moving all extremities X 4  Post vital signs: Reviewed and stable  Last Vitals:  Vitals Value Taken Time  BP 128/84 06/03/2017 11:25 AM  Temp 36.9 C 06/03/2017 11:24 AM  Pulse 85 06/03/2017 11:26 AM  Resp 11 06/03/2017 11:26 AM  SpO2 98 % 06/03/2017 11:26 AM  Vitals shown include unvalidated device data.  Last Pain:  Vitals:   06/03/17 1124  TempSrc:   PainSc: Asleep      Patients Stated Pain Goal: 2 (05/31/17 2337)  Complications: No apparent anesthesia complications

## 2017-06-04 ENCOUNTER — Encounter (HOSPITAL_COMMUNITY): Payer: Self-pay | Admitting: Vascular Surgery

## 2017-06-07 ENCOUNTER — Other Ambulatory Visit (HOSPITAL_COMMUNITY): Payer: Self-pay | Admitting: Interventional Radiology

## 2017-06-07 DIAGNOSIS — I729 Aneurysm of unspecified site: Secondary | ICD-10-CM

## 2017-06-08 ENCOUNTER — Telehealth: Payer: Self-pay | Admitting: Vascular Surgery

## 2017-06-08 NOTE — Telephone Encounter (Signed)
Confirmed 06/29/17 appt. Mailed letter.

## 2017-06-08 NOTE — Telephone Encounter (Signed)
-----   Message from Sharee PimpleMarilyn K McChesney, RN sent at 06/06/2017  9:30 AM EDT ----- Regarding: 4 weeks postop   ----- Message ----- From: Fransisco Hertzhen, Brian L, MD Sent: 06/03/2017  11:17 AM To: Vvs Charge Pool  Luis Johnson [id number] Oct 05, 1955    PROCEDURE: 1. Bilateral temporal artery biopsies   F/U 4 weeks

## 2017-06-16 DIAGNOSIS — Z8546 Personal history of malignant neoplasm of prostate: Secondary | ICD-10-CM | POA: Diagnosis not present

## 2017-06-16 DIAGNOSIS — Z85038 Personal history of other malignant neoplasm of large intestine: Secondary | ICD-10-CM | POA: Diagnosis not present

## 2017-06-23 ENCOUNTER — Telehealth: Payer: Self-pay

## 2017-06-23 NOTE — Telephone Encounter (Signed)
Notes recorded by Hildred AlaminMurrell, Katrina Y, RN on 06/23/2017 at 3:20 PM EDT Left vm for patient to call back about biopsy results. ------

## 2017-06-23 NOTE — Telephone Encounter (Signed)
-----   Message from Micki RileyPramod S Sethi, MD sent at 06/22/2017 12:24 PM EDT ----- Joneen RoachKindly inform patient that temporal artery biopsy showed no definite evidence of inflammation

## 2017-06-23 NOTE — Telephone Encounter (Signed)
Notes recorded by Hildred AlaminMurrell, Vasilis Luhman Y, RN on 06/23/2017 at 9:46 AM EDT Rn tried to call patient. The phone had a dial tone. Could not leave message. ------

## 2017-06-23 NOTE — Telephone Encounter (Signed)
Called the patient and LVM on the patients home number per DPR. No answer. LVM for the pt to call back.

## 2017-06-27 ENCOUNTER — Ambulatory Visit: Payer: PRIVATE HEALTH INSURANCE | Admitting: Occupational Therapy

## 2017-06-27 ENCOUNTER — Ambulatory Visit: Payer: PRIVATE HEALTH INSURANCE | Attending: Family Medicine | Admitting: Physical Therapy

## 2017-06-29 ENCOUNTER — Ambulatory Visit (INDEPENDENT_AMBULATORY_CARE_PROVIDER_SITE_OTHER): Payer: Medicare Other | Admitting: Physician Assistant

## 2017-06-29 VITALS — BP 166/88 | HR 108 | Temp 98.6°F | Resp 20 | Ht 67.0 in | Wt 169.0 lb

## 2017-06-29 DIAGNOSIS — R51 Headache: Secondary | ICD-10-CM

## 2017-06-29 DIAGNOSIS — R519 Headache, unspecified: Secondary | ICD-10-CM | POA: Insufficient documentation

## 2017-06-29 DIAGNOSIS — Z9889 Other specified postprocedural states: Secondary | ICD-10-CM | POA: Insufficient documentation

## 2017-06-29 NOTE — Progress Notes (Addendum)
    Postoperative Visit   History of Present Illness   Luis Johnson is a 62 y.o. male who presents for postoperative follow-up for bilateral temporal artery biopsy by Dr. Imogene Burnhen on 06/03/17 due to concerns for temporal arteritis having experienced bilateral vision changes in the setting of intracranial bleeding.  He continues his steroid regimen as an outpatient.  He states headaches and vision changes have improved.  He denies any drainage from incisions.  He has noticed a small bump at the end of both incisions but says it does not bother him.  Cerebral angiogram performed during hospitalization demonstrated widely patent internal carotid arteries bilaterally with mainly intracranial stenosis as well as right MCA aneurysm.  He has follow-up with neurosurgery for treatment of intracranial aneurysm.  He has been told to avoid aspirin.  He denies any further stroke like symptoms including slurring speech, changes in vision, or one sided weakness.  Current Outpatient Medications  Medication Sig Dispense Refill  . acetaminophen (TYLENOL) 325 MG tablet Take 1,000 mg by mouth 3 (three) times daily as needed for mild pain.     Marland Kitchen. albuterol-ipratropium (COMBIVENT) 18-103 MCG/ACT inhaler Inhale 1 puff into the lungs every 4 (four) hours as needed for wheezing or shortness of breath.    Marland Kitchen. amLODipine (NORVASC) 10 MG tablet Take 10 mg by mouth daily.    Marland Kitchen. atorvastatin (LIPITOR) 80 MG tablet Take 80 mg by mouth daily.  1  . EPINEPHrine 0.3 mg/0.3 mL IJ SOAJ injection Inject 0.3 mg into the muscle daily as needed (anaphylaxis).     Marland Kitchen. escitalopram (LEXAPRO) 10 MG tablet Take 10 mg by mouth daily.    . pantoprazole (PROTONIX) 40 MG tablet Take 1 tablet (40 mg total) by mouth daily. 30 tablet 1  . predniSONE (DELTASONE) 10 MG tablet Take 6 tablets (60 mg total) by mouth daily with breakfast for 14 days, THEN 5 tablets (50 mg total) daily with breakfast for 14 days, THEN 4 tablets (40 mg total) daily with breakfast for  14 days. 210 tablet 0  . QVAR 80 MCG/ACT inhaler INHALE 2 PUFFS TWICE DAILY  0   No current facility-administered medications for this visit.     For VQI Use Only   PRE-ADM LIVING: Home  AMB STATUS: Ambulatory with Assistance   Physical Examination   Vitals:   06/29/17 1330 06/29/17 1332  BP: (!) 178/94 (!) 166/88  Pulse: (!) 108   Resp: 20   Temp: 98.6 F (37 C)   TempSrc: Oral   SpO2: 98%   Weight: 169 lb (76.7 kg)   Height: 5\' 7"  (1.702 m)     bilateral temporal: Incisions are well healed; unable to trim small amount of suture exposed  Neuro: CN 2-12 are grossly intact; some persistent LLE weakness   Medical Decision Making   Luis Polesony Epping is a 62 y.o. male who presents s/p bilateral temporal artery biopsies by Dr. Imogene Burnhen   Surgical pathology is negative for any inflammation to suggest temporal arteritis  Bilateral temporal incisions well-healed  Follow-up with PCP for management of diabetes during steroid regimen  Follow-up as scheduled with neurosurgery for management of right MCA aneurysm  Bilateral ICA widely patent demonstrated by cerebral angiogram; he may follow up with us on an as-needed basis  Emilie RutterMatthew Sheamus Hasting, PA-C [facility] [place] Office: [phone]

## 2017-06-30 ENCOUNTER — Ambulatory Visit (HOSPITAL_COMMUNITY)
Admission: RE | Admit: 2017-06-30 | Discharge: 2017-06-30 | Disposition: A | Discharge status: Home / Self Care | Payer: Medicare Other | Source: Ambulatory Visit | Attending: Interventional Radiology | Admitting: Interventional Radiology

## 2017-06-30 DIAGNOSIS — I729 Aneurysm of unspecified site: Secondary | ICD-10-CM

## 2017-06-30 NOTE — Consult Note (Signed)
Chief Complaint: Patient was seen in consultation today for right MCA bifurcation aneurysm.  Supervising Physician: Julieanne Cotton  Patient Status: [facility] - Out-pt  History of Present Illness: Luis Johnson is a 62 y.o. male  Hx recurrent ICH (2002, 2019), hypertension, and hyperlipidemia.  Presented to ED 05/31/2017 for bilateral vision loss lasting approximately 15 minutes.  CT head 05/31/2017: 1. Small focus of acute subarachnoid blood in the right occipital lobe, along the right parieto-occipital sulcus. 2. No associated mass effect. 3. Severe chronic white matter disease and sequelae of prior bilateral occipital hemorrhages.  MRI/MRA brain/head 05/31/2017: 1. No acute infarct. 2. Redemonstration of small volume right occipital subarachnoid hemorrhage. 3. Extensive bilateral posterior hemisphere hemosiderin deposition related to remote hemorrhagic events. 4. No emergent large vessel occlusion. No vascular abnormality that would explain the reported vision loss. 5. Severe chronic ischemic microangiopathy. 6. 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  Patient underwent cerebral angiogram with Dr. Corliss Skains 06/02/2017, findings include right MCA bifurcation aneurysm.  Patient presents today for consultation regarding management of right MCA bifurcation aneurysm. Accompanied by wife. Complains of bilateral tinnitus and stuttering speech x 2 weeks since previous ED encounter 05/31/2017. States vision loss has improved, but he still notices a right visual field blind spot. States headaches are still present, but have improved. States he has left-sided weakness and numbness/tingling since prior ICH 2002, remains stable at this time. States he sees PT/OT for this. Denies dizziness, syncope, or hearing changes.  Past Medical History:  Diagnosis Date  . Hypertension   . Stroke [facility])     Past Surgical History:  Procedure Laterality Date  . ARTERY BIOPSY  Bilateral 06/03/2017   Procedure: BILATERAL TEMPORAL ARTERY BIOPSY;  Surgeon: Fransisco Hertz, MD;  Location: [facility] OR;  Service: Vascular;  Laterality: Bilateral;  . IR 3D INDEPENDENT WKST  06/02/2017  . IR ANGIO INTRA EXTRACRAN SEL COM CAROTID INNOMINATE BILAT MOD SED  06/02/2017  . IR ANGIO VERTEBRAL SEL VERTEBRAL BILAT MOD SED  06/02/2017  . SHOULDER SURGERY      Allergies: Eggs or egg-derived products  Medications: Prior to Admission medications   Medication Sig Start Date End Date Taking? Authorizing Provider  acetaminophen (TYLENOL) 325 MG tablet Take 1,000 mg by mouth 3 (three) times daily as needed for mild pain.     [provider]  albuterol-ipratropium (COMBIVENT) 18-103 MCG/ACT inhaler Inhale 1 puff into the lungs every 4 (four) hours as needed for wheezing or shortness of breath.    [provider]  amLODipine (NORVASC) 10 MG tablet Take 10 mg by mouth daily.    [provider]  atorvastatin (LIPITOR) 80 MG tablet Take 80 mg by mouth daily. 05/04/17   [provider]  EPINEPHrine 0.3 mg/0.3 mL IJ SOAJ injection Inject 0.3 mg into the muscle daily as needed (anaphylaxis).     [provider]  escitalopram (LEXAPRO) 10 MG tablet Take 10 mg by mouth daily.    [provider]  pantoprazole (PROTONIX) 40 MG tablet Take 1 tablet (40 mg total) by mouth daily. 06/03/17 06/03/18  Standley Brooking, MD  predniSONE (DELTASONE) 10 MG tablet Take 6 tablets (60 mg total) by mouth daily with breakfast for 14 days, THEN 5 tablets (50 mg total) daily with breakfast for 14 days, THEN 4 tablets (40 mg total) daily with breakfast for 14 days. 06/03/17 07/15/17  Standley Brooking, MD  QVAR 80 MCG/ACT inhaler INHALE 2 PUFFS TWICE DAILY 01/08/15   [provider]     Family History  Problem Relation Age of Onset  . Seizures Mother   . Heart failure Mother   . Stroke Brother   . Heart attack Brother     Social History   Socioeconomic History    . Marital status: Married    Spouse name: Not on file  . Number of children: Not on file  . Years of education: Not on file  . Highest education level: Not on file  Occupational History  . Not on file  Social Needs  . Financial resource strain: Not on file  . Food insecurity:    Worry: Not on file    Inability: Not on file  . Transportation needs:    Medical: Not on file    Non-medical: Not on file  Tobacco Use  . Smoking status: Former Smoker    Last attempt to quit: 01/21/1994    Years since quitting: 23.4  . Smokeless tobacco: Never Used  Substance and Sexual Activity  . Alcohol use: No  . Drug use: No  . Sexual activity: Not on file  Lifestyle  . Physical activity:    Days per week: Not on file    Minutes per session: Not on file  . Stress: Not on file  Relationships  . Social connections:    Talks on phone: Not on file    Gets together: Not on file    Attends religious service: Not on file    Active member of club or organization: Not on file    Attends meetings of clubs or organizations: Not on file    Relationship status: Not on file  Other Topics Concern  . Not on file  Social History Narrative  . Not on file     Review of Systems: A 12 point ROS discussed and pertinent positives are indicated in the HPI above.  All other systems are negative.  Review of Systems  Constitutional: Negative for activity change and fever.  HENT: Positive for tinnitus. Negative for hearing loss.   Eyes: Positive for visual disturbance.  Respiratory: Negative for shortness of breath and wheezing.   Cardiovascular: Negative for chest pain and palpitations.  Neurological: Positive for speech difficulty, weakness and numbness. Negative for dizziness, syncope and headaches.  Psychiatric/Behavioral: Negative for behavioral problems and confusion.    Vital Signs: There were no vitals taken for this visit.  Physical Exam  Constitutional: He is oriented to person, place, and  time. He appears well-developed and well-nourished. No distress.  Neurological: He is alert and oriented to person, place, and time.  Psychiatric: He has a normal mood and affect. His behavior is normal. Judgment and thought content normal.    Imaging: Mr Shirlee Latch ZO Contrast  Result Date: 05/31/2017 CLINICAL DATA:  Bilateral vision loss and subarachnoid hemorrhage. EXAM: MRI HEAD WITHOUT CONTRAST MRA HEAD WITHOUT CONTRAST TECHNIQUE: Multiplanar, multiecho pulse sequences of the brain and surrounding structures were obtained without intravenous contrast. Angiographic images of the head were obtained using MRA technique without contrast. COMPARISON:  Head CT 05/31/2017 the Brain MRI 11/11/2014 FINDINGS: MRI HEAD FINDINGS Brain: The midline structures are normal. No acute infarct. Small amount of hyperintensity on diffusion-weighted imaging in the right occipital lobe likely corresponds to the small amount of subarachnoid blood seen on the concomitant CT. No mass lesion, hydrocephalus, dural abnormality or extra-axial collection. Diffuse confluent hyperintense T2-weighted signal within the periventricular, deep and juxtacortical white matter, most commonly due to chronic ischemic microangiopathy. No  age-advanced or lobar predominant atrophy. Extensive hemosiderin deposition within both posterior hemispheres, left-greater-than-right. Vascular: Major intracranial arterial and venous sinus flow voids are preserved. Skull and upper cervical spine: The visualized skull base, calvarium, upper cervical spine and extracranial soft tissues are normal. Sinuses/Orbits: No fluid levels or advanced mucosal thickening. No mastoid or middle ear effusion. Normal orbits. MRA HEAD FINDINGS Intracranial internal carotid arteries: Normal. Anterior cerebral arteries: Normal. Middle cerebral arteries: Right MCA bifurcation aneurysm measures approximately 3 mm, unchanged from 11/11/2014. Posterior communicating arteries: Absent  bilaterally. Posterior cerebral arteries: Normal. Basilar artery: Normal. Vertebral arteries: Codominant.  Normal. Superior cerebellar arteries: Normal. Anterior inferior cerebellar arteries: Normal. Posterior inferior cerebellar arteries: Normal. IMPRESSION: 1. No acute infarct. 2. Redemonstration of small volume right occipital subarachnoid hemorrhage. 3. Extensive bilateral posterior hemisphere hemosiderin deposition related to remote hemorrhagic events. 4. No emergent large vessel occlusion. No vascular abnormality that would explain the reported vision loss. 5. Severe chronic ischemic microangiopathy. 6. 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 Electronically Signed   By: Deatra Robinson M.D.   On: 05/31/2017 19:48   Mr Brain Wo Contrast  Result Date: 05/31/2017 CLINICAL DATA:  Bilateral vision loss and subarachnoid hemorrhage. EXAM: MRI HEAD WITHOUT CONTRAST MRA HEAD WITHOUT CONTRAST TECHNIQUE: Multiplanar, multiecho pulse sequences of the brain and surrounding structures were obtained without intravenous contrast. Angiographic images of the head were obtained using MRA technique without contrast. COMPARISON:  Head CT 05/31/2017 the Brain MRI 11/11/2014 FINDINGS: MRI HEAD FINDINGS Brain: The midline structures are normal. No acute infarct. Small amount of hyperintensity on diffusion-weighted imaging in the right occipital lobe likely corresponds to the small amount of subarachnoid blood seen on the concomitant CT. No mass lesion, hydrocephalus, dural abnormality or extra-axial collection. Diffuse confluent hyperintense T2-weighted signal within the periventricular, deep and juxtacortical white matter, most commonly due to chronic ischemic microangiopathy. No age-advanced or lobar predominant atrophy. Extensive hemosiderin deposition within both posterior hemispheres, left-greater-than-right. Vascular: Major intracranial arterial and venous sinus flow voids are preserved. Skull and  upper cervical spine: The visualized skull base, calvarium, upper cervical spine and extracranial soft tissues are normal. Sinuses/Orbits: No fluid levels or advanced mucosal thickening. No mastoid or middle ear effusion. Normal orbits. MRA HEAD FINDINGS Intracranial internal carotid arteries: Normal. Anterior cerebral arteries: Normal. Middle cerebral arteries: Right MCA bifurcation aneurysm measures approximately 3 mm, unchanged from 11/11/2014. Posterior communicating arteries: Absent bilaterally. Posterior cerebral arteries: Normal. Basilar artery: Normal. Vertebral arteries: Codominant.  Normal. Superior cerebellar arteries: Normal. Anterior inferior cerebellar arteries: Normal. Posterior inferior cerebellar arteries: Normal. IMPRESSION: 1. No acute infarct. 2. Redemonstration of small volume right occipital subarachnoid hemorrhage. 3. Extensive bilateral posterior hemisphere hemosiderin deposition related to remote hemorrhagic events. 4. No emergent large vessel occlusion. No vascular abnormality that would explain the reported vision loss. 5. Severe chronic ischemic microangiopathy. 6. 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 Electronically Signed   By: Deatra Robinson M.D.   On: 05/31/2017 19:48   Ir 3d Andy Gauss  Result Date: 06/03/2017 CLINICAL DATA:  Severe headaches.  Bioccipital hemorrhages. EXAM: BILATERAL COMMON CAROTID AND INNOMINATE ANGIOGRAPHY; IR ANGIO VERTEBRAL SEL VERTEBRAL BILAT MOD SED; WORKSTATION 3D RECONSTRUCTION COMPARISON:  MRI of the head, MRA of the head 05/31/2017. MEDICATIONS: Heparin 0 units IV; no antibiotic was administered within 1 hour of the procedure. ANESTHESIA/SEDATION: Versed 1 mg IV; Fentanyl 25 mcg IV. Moderate Sedation Time:  40 minutes. The patient was continuously monitored during the procedure by the interventional radiology nurse  under my direct supervision. CONTRAST:  Isovue 300 approximately 85 cc. FLUOROSCOPY TIME:  Fluoroscopy  Time: 9 minutes 50 seconds (1079 mGy). COMPLICATIONS: None immediate. TECHNIQUE: Informed written consent was obtained from the patient after a thorough discussion of the procedural risks, benefits and alternatives. All questions were addressed. Maximal Sterile Barrier Technique was utilized including caps, mask, sterile gowns, sterile gloves, sterile drape, hand hygiene and skin antiseptic. A timeout was performed prior to the initiation of the procedure. The right groin was prepped and draped in the usual sterile fashion. Thereafter using modified Seldinger technique, transfemoral access into the right common femoral artery was obtained without difficulty. Over a 0.035 inch guidewire, a 5 French Pinnacle sheath was inserted. Through this, and also over 0.035 inch guidewire, a 5 [place] JB 1 catheter was advanced to the aortic arch region and selectively positioned in the right common carotid artery,, the right vertebral artery, the left common carotid artery and the left vertebral artery. Also performed was a 3D rotational arteriogram of the right anterior intracranial circulation via injection into the right common carotid artery. 3D reconstruction images were then performed on a separate workstation. FINDINGS: The right common carotid arteriogram demonstrates the right external carotid artery and its major branches to be widely patent. The right internal carotid artery at the bulb to the cranial skull base demonstrates wide patency. The petrous, cavernous and supraclinoid segments demonstrate wide patency as well. The right middle cerebral artery in the M1 segment demonstrates a mild focal stenosis. Distal to this there is a moderate tapered stenosis leading to the right MCA trifurcation. At the trifurcation is a bilobed saccular aneurysm. The right middle cerebral artery and the right anterior cerebral artery opacify into the capillary and venous phases. A 3D rotational arteriogram confirms the presence of  approximately 3 mm x 2.8 mm slightly irregular bilobed right MCA aneurysm arising from the superior division of the right middle cerebral artery with a moderate size neck. The right vertebral artery origin is widely patent. The vessel is seen to opacify to the cranial skull base. Normal opacification is seen of the right posterior-cerebellar artery and the right vertebrobasilar junction. The opacified portion of the basilar artery, the posterior cerebral arteries, the superior cerebellar arteries and the anterior-inferior cerebellar arteries demonstrate patency. There is approximately 30- 40% stenosis of the mid basilar artery just distal to the origin of the anterior inferior cerebellar artery. Also demonstrated is a moderate 50%-70% stenosis of the left posterior cerebral artery P1 segment. Similar though slightly less prominent stenosis is seen involving the right posterior cerebral artery P1 segment. The left vertebral artery origin is widely patent. The proximal 1/3 demonstrates mild caliber irregularity without significant narrowing. More distally the left vertebrobasilar junction and the left posterior inferior cerebral artery demonstrated wide patency. The distal vertebrobasilar junction, the basilar artery, the posterior cerebral arteries, the superior cerebellar arteries and the anterior-inferior cerebellar arteries demonstrate patency into the capillary and venous phases. Again demonstrated are the areas of 50% stenosis of the mid basilar artery, and also of the posterior cerebral arteries left greater than right. The left common carotid arteriogram demonstrates the left external carotid artery and its major branches to be widely patent. The left internal carotid artery at the bulb to the cranial skull base opacifies widely. The petrous, cavernous and supraclinoid segments demonstrate normal patency. The left middle cerebral artery in its mid M1 segment has a mild to moderate stenosis. Distal to this, the  left MCA trifurcation branches, and the  left anterior cerebral artery opacify into the capillary and venous phases. IMPRESSION: Approximately 3 mm x 2.8 mm slightly irregular bilobed saccular aneurysm arising from the superior division of the right middle cerebral artery. Approximately 50-70% stenosis of the left posterior cerebral artery P1 segment, and less so of the right posterior cerebral artery P1 segment. 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 Mild-to-moderate stenosis of the left middle cerebral artery M1 segment, and also of the proximal right middle cerebral artery M1 segment. These findings are nonspecific in terms of etiology. Diagnostic considerations will be inflammatory vasculitis versus intracranial arteriosclerosis versus vasospasm. Clinical correlation suggested. PLAN: Patient will be seen in outpatient clinic for management of his right middle cerebral artery irregular bilobed aneurysm when his present clinical situation has stabilized. Electronically Signed   By: Julieanne Cotton M.D.   On: 06/02/2017 15:50   Ct Head Code Stroke Wo Contrast  Result Date: 05/31/2017 CLINICAL DATA:  Code stroke.  Blurred vision EXAM: CT HEAD WITHOUT CONTRAST TECHNIQUE: Contiguous axial images were obtained from the base of the skull through the vertex without intravenous contrast. COMPARISON:  Brain MRI 11/11/2014 and head CT 11/12/2014. FINDINGS: Brain: Small focus of acute subarachnoid hemorrhage along the superior aspect of the right parieto-occipital sulcus. No associated mass effect. There is encephalomalacia of the left occipital lobe at the site of remote hemorrhage. There is periventricular hypoattenuation compatible with chronic microvascular disease. The hemorrhagic focus measures 6 mm. Vascular: No hyperdense vessel or unexpected vascular calcification. Skull: Normal visualized skull base, calvarium and extracranial soft tissues. Sinuses/Orbits: No sinus fluid levels or advanced  mucosal thickening. No mastoid effusion. Normal orbits. IMPRESSION: 1. Small focus of acute subarachnoid blood in the right occipital lobe, along the right parieto-occipital sulcus. 2. No associated mass effect. 3. Severe chronic white matter disease and sequelae of prior bilateral occipital hemorrhages. Critical Value/emergent results were called by telephone at the time of interpretation on 05/31/2017 at 6:41 pm to Dr. Wilford Corner, who verbally acknowledged these results. Electronically Signed   By: Deatra Robinson M.D.   On: 05/31/2017 18:43   Ir Angio Intra Extracran Sel Com Carotid Innominate Bilat Mod Sed  Result Date: 06/03/2017 CLINICAL DATA:  Severe headaches.  Bioccipital hemorrhages. EXAM: BILATERAL COMMON CAROTID AND INNOMINATE ANGIOGRAPHY; IR ANGIO VERTEBRAL SEL VERTEBRAL BILAT MOD SED; WORKSTATION 3D RECONSTRUCTION COMPARISON:  MRI of the head, MRA of the head 05/31/2017. MEDICATIONS: Heparin 0 units IV; no antibiotic was administered within 1 hour of the procedure. ANESTHESIA/SEDATION: Versed 1 mg IV; Fentanyl 25 mcg IV. Moderate Sedation Time:  40 minutes. The patient was continuously monitored during the procedure by the interventional radiology nurse under my direct supervision. CONTRAST:  Isovue 300 approximately 85 cc. FLUOROSCOPY TIME:  Fluoroscopy Time: 9 minutes 50 seconds (1079 mGy). COMPLICATIONS: None immediate. TECHNIQUE: Informed written consent was obtained from the patient after a thorough discussion of the procedural risks, benefits and alternatives. All questions were addressed. Maximal Sterile Barrier Technique was utilized including caps, mask, sterile gowns, sterile gloves, sterile drape, hand hygiene and skin antiseptic. A timeout was performed prior to the initiation of the procedure. The right groin was prepped and draped in the usual sterile fashion. Thereafter using modified Seldinger technique, transfemoral access into the right common femoral artery was obtained without  difficulty. Over a 0.035 inch guidewire, a 5 French Pinnacle sheath was inserted. Through this, and also over 0.035 inch guidewire, a 5 [place] JB 1 catheter was advanced to the aortic arch region and selectively positioned  in the right common carotid artery,, the right vertebral artery, the left common carotid artery and the left vertebral artery. Also performed was a 3D rotational arteriogram of the right anterior intracranial circulation via injection into the right common carotid artery. 3D reconstruction images were then performed on a separate workstation. FINDINGS: The right common carotid arteriogram demonstrates the right external carotid artery and its major branches to be widely patent. The right internal carotid artery at the bulb to the cranial skull base demonstrates wide patency. The petrous, cavernous and supraclinoid segments demonstrate wide patency as well. The right middle cerebral artery in the M1 segment demonstrates a mild focal stenosis. Distal to this there is a moderate tapered stenosis leading to the right MCA trifurcation. At the trifurcation is a bilobed saccular aneurysm. The right middle cerebral artery and the right anterior cerebral artery opacify into the capillary and venous phases. A 3D rotational arteriogram confirms the presence of approximately 3 mm x 2.8 mm slightly irregular bilobed right MCA aneurysm arising from the superior division of the right middle cerebral artery with a moderate size neck. The right vertebral artery origin is widely patent. The vessel is seen to opacify to the cranial skull base. Normal opacification is seen of the right posterior-cerebellar artery and the right vertebrobasilar junction. The opacified portion of the basilar artery, the posterior cerebral arteries, the superior cerebellar arteries and the anterior-inferior cerebellar arteries demonstrate patency. There is approximately 30- 40% stenosis of the mid basilar artery just distal to the origin  of the anterior inferior cerebellar artery. Also demonstrated is a moderate 50%-70% stenosis of the left posterior cerebral artery P1 segment. Similar though slightly less prominent stenosis is seen involving the right posterior cerebral artery P1 segment. The left vertebral artery origin is widely patent. The proximal 1/3 demonstrates mild caliber irregularity without significant narrowing. More distally the left vertebrobasilar junction and the left posterior inferior cerebral artery demonstrated wide patency. The distal vertebrobasilar junction, the basilar artery, the posterior cerebral arteries, the superior cerebellar arteries and the anterior-inferior cerebellar arteries demonstrate patency into the capillary and venous phases. Again demonstrated are the areas of 50% stenosis of the mid basilar artery, and also of the posterior cerebral arteries left greater than right. The left common carotid arteriogram demonstrates the left external carotid artery and its major branches to be widely patent. The left internal carotid artery at the bulb to the cranial skull base opacifies widely. The petrous, cavernous and supraclinoid segments demonstrate normal patency. The left middle cerebral artery in its mid M1 segment has a mild to moderate stenosis. Distal to this, the left MCA trifurcation branches, and the left anterior cerebral artery opacify into the capillary and venous phases. IMPRESSION: Approximately 3 mm x 2.8 mm slightly irregular bilobed saccular aneurysm arising from the superior division of the right middle cerebral artery. Approximately 50-70% stenosis of the left posterior cerebral artery P1 segment, and less so of the right posterior cerebral artery P1 segment. 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 Mild-to-moderate stenosis of the left middle cerebral artery M1 segment, and also of the proximal right middle cerebral artery M1 segment. These findings are nonspecific in terms of  etiology. Diagnostic considerations will be inflammatory vasculitis versus intracranial arteriosclerosis versus vasospasm. Clinical correlation suggested. PLAN: Patient will be seen in outpatient clinic for management of his right middle cerebral artery irregular bilobed aneurysm when his present clinical situation has stabilized. Electronically Signed   By: Julieanne Cotton M.D.   On: 06/02/2017  15:50   Ir Angio Vertebral Sel Vertebral Bilat Mod Sed  Result Date: 06/03/2017 CLINICAL DATA:  Severe headaches.  Bioccipital hemorrhages. EXAM: BILATERAL COMMON CAROTID AND INNOMINATE ANGIOGRAPHY; IR ANGIO VERTEBRAL SEL VERTEBRAL BILAT MOD SED; WORKSTATION 3D RECONSTRUCTION COMPARISON:  MRI of the head, MRA of the head 05/31/2017. MEDICATIONS: Heparin 0 units IV; no antibiotic was administered within 1 hour of the procedure. ANESTHESIA/SEDATION: Versed 1 mg IV; Fentanyl 25 mcg IV. Moderate Sedation Time:  40 minutes. The patient was continuously monitored during the procedure by the interventional radiology nurse under my direct supervision. CONTRAST:  Isovue 300 approximately 85 cc. FLUOROSCOPY TIME:  Fluoroscopy Time: 9 minutes 50 seconds (1079 mGy). COMPLICATIONS: None immediate. TECHNIQUE: Informed written consent was obtained from the patient after a thorough discussion of the procedural risks, benefits and alternatives. All questions were addressed. Maximal Sterile Barrier Technique was utilized including caps, mask, sterile gowns, sterile gloves, sterile drape, hand hygiene and skin antiseptic. A timeout was performed prior to the initiation of the procedure. The right groin was prepped and draped in the usual sterile fashion. Thereafter using modified Seldinger technique, transfemoral access into the right common femoral artery was obtained without difficulty. Over a 0.035 inch guidewire, a 5 French Pinnacle sheath was inserted. Through this, and also over 0.035 inch guidewire, a 5 [place] JB 1 catheter was  advanced to the aortic arch region and selectively positioned in the right common carotid artery,, the right vertebral artery, the left common carotid artery and the left vertebral artery. Also performed was a 3D rotational arteriogram of the right anterior intracranial circulation via injection into the right common carotid artery. 3D reconstruction images were then performed on a separate workstation. FINDINGS: The right common carotid arteriogram demonstrates the right external carotid artery and its major branches to be widely patent. The right internal carotid artery at the bulb to the cranial skull base demonstrates wide patency. The petrous, cavernous and supraclinoid segments demonstrate wide patency as well. The right middle cerebral artery in the M1 segment demonstrates a mild focal stenosis. Distal to this there is a moderate tapered stenosis leading to the right MCA trifurcation. At the trifurcation is a bilobed saccular aneurysm. The right middle cerebral artery and the right anterior cerebral artery opacify into the capillary and venous phases. A 3D rotational arteriogram confirms the presence of approximately 3 mm x 2.8 mm slightly irregular bilobed right MCA aneurysm arising from the superior division of the right middle cerebral artery with a moderate size neck. The right vertebral artery origin is widely patent. The vessel is seen to opacify to the cranial skull base. Normal opacification is seen of the right posterior-cerebellar artery and the right vertebrobasilar junction. The opacified portion of the basilar artery, the posterior cerebral arteries, the superior cerebellar arteries and the anterior-inferior cerebellar arteries demonstrate patency. There is approximately 30- 40% stenosis of the mid basilar artery just distal to the origin of the anterior inferior cerebellar artery. Also demonstrated is a moderate 50%-70% stenosis of the left posterior cerebral artery P1 segment. Similar though  slightly less prominent stenosis is seen involving the right posterior cerebral artery P1 segment. The left vertebral artery origin is widely patent. The proximal 1/3 demonstrates mild caliber irregularity without significant narrowing. More distally the left vertebrobasilar junction and the left posterior inferior cerebral artery demonstrated wide patency. The distal vertebrobasilar junction, the basilar artery, the posterior cerebral arteries, the superior cerebellar arteries and the anterior-inferior cerebellar arteries demonstrate patency into the capillary and venous  phases. Again demonstrated are the areas of 50% stenosis of the mid basilar artery, and also of the posterior cerebral arteries left greater than right. The left common carotid arteriogram demonstrates the left external carotid artery and its major branches to be widely patent. The left internal carotid artery at the bulb to the cranial skull base opacifies widely. The petrous, cavernous and supraclinoid segments demonstrate normal patency. The left middle cerebral artery in its mid M1 segment has a mild to moderate stenosis. Distal to this, the left MCA trifurcation branches, and the left anterior cerebral artery opacify into the capillary and venous phases. IMPRESSION: Approximately 3 mm x 2.8 mm slightly irregular bilobed saccular aneurysm arising from the superior division of the right middle cerebral artery. Approximately 50-70% stenosis of the left posterior cerebral artery P1 segment, and less so of the right posterior cerebral artery P1 segment. 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 Mild-to-moderate stenosis of the left middle cerebral artery M1 segment, and also of the proximal right middle cerebral artery M1 segment. These findings are nonspecific in terms of etiology. Diagnostic considerations will be inflammatory vasculitis versus intracranial arteriosclerosis versus vasospasm. Clinical correlation suggested. PLAN:  Patient will be seen in outpatient clinic for management of his right middle cerebral artery irregular bilobed aneurysm when his present clinical situation has stabilized. Electronically Signed   By: Julieanne Cotton M.D.   On: 06/02/2017 15:50    Labs:  CBC: Recent Labs    05/31/17 1929 05/31/17 1938  WBC 5.3  --   HGB 14.8 16.7  HCT 44.7 49.0  PLT 270  --     COAGS: Recent Labs    05/31/17 1929  INR 0.94  APTT 26    BMP: Recent Labs    05/31/17 1929 05/31/17 1938  NA 139 142  K 4.0 3.9  CL 106 105  CO2 24  --   GLUCOSE 93 89  BUN 10 13  CALCIUM 9.2  --   CREATININE 0.80 0.80  GFRNONAA >60  --   GFRAA >60  --     LIVER FUNCTION TESTS: Recent Labs    05/31/17 1929  BILITOT 0.4  AST 23  ALT 25  ALKPHOS 70  PROT 8.1  ALBUMIN 3.9    TUMOR MARKERS: No results for input(s): AFPTM, CEA, CA199, CHROMGRNA in the last 8760 hours.  Assessment and Plan:  Right MCA bifurcation aneurysm. Reviewed imaging with patient and wife. Explained management options, including medical management with routine scans versus cerebral angiogram with intent to treat. Patient states that he is getting a MRI head at [facility] 07/11/2017. He and wife feel that they wish to proceed with treatment following MRI. Informed patient that it is best to wait 3 months after his most recent stroke (05/31/2017) to proceed with treatment for safety reasons, as the procedure requires anticoagulation (Plavix and Aspirin use). Because of this, it is best to follow-up at that time to review his most recent MRI and make a management plan.  Plan for follow-up at the end of June 2019 to review MRI.   Instructed patient to continue following up with PT/OT.  All questions answered and concerns addressed. Patient and wife convey understanding and agree with plan.  Thank you for this interesting consult.  I greatly enjoyed meeting Mamoudou Mulvehill and look forward to participating in their care.  A copy of this  report was sent to the requesting provider on this date.  Electronically Signed: Elwin Mocha, PA-C 06/30/2017, 12:54 PM  I spent a total of 30 minutes in face to face in clinical consultation, greater than 50% of which was counseling/coordinating care for right MCA bifurcation aneurysm.

## 2017-07-04 NOTE — Progress Notes (Signed)
[facility]  PATIENT: Luis Johnson DOB: [birth year]   REASON FOR VISIT: right homonymous   Hemianopsia, ICH HISTORY FROM: Patient    HISTORY OF PRESENT ILLNESS:   History: Luis Johnson is an 62 y.o. male with a past medical history significant for HTN, presents to the ED 11/11/2014 for evaluation of left sided numbness, blurred vision, dysarthria.  CT brain showed acute hemorrhage involving the left parietal occipital lobe. Transthoracic echo showed normal ejection fraction without cardiac source of embolism. LDL cholesterol was significantly elevated at 231 and hemoglobin A1c was 6.1. Patient had not been on any antithrombotic prior to admission. He was found to have mild right upper quadrant visual field deficit and right facial droop.  Update 02/20/2015 PS: He returns for follow-up after last visit 3 months ago. He states he has been to the eye doctor Dr. Elmer Picker who did a computerized visual field testing and told him that he could drive. He has mild memory difficulties which are unchanged and not progressive.  He walks with a cane his balance is good and his and no recent falls. His tolerating Norvasc and Lipitor quite well without any side effects. UPDATE  07/12/2017CM  He is having left shoulder replacement at the [place] soon.  His blood pressure is elevated in office today . [facility]. He ambulates with a cane.  No recent falls.Tolerating Norvasc and Lipitor quite well without any side effects. He returns for reevaluation.  Update 07/04/17: Recent admission on 05/31/2017 for complaints of headache and bilateral visual loss.  Stat CT showed a small focus of acute subarachnoid right occipital lobe along with severe chronic white matter disease.  It was determined that right occipital subarachnoid hemorrhage was incidental finding and not related to presenting symptoms.MRI confirms small right occipital hemorrhagic hemorrhage without acute infarcts. As etiology  for vision loss remained unclear there was some concern for temporal arteritis and therefore neurology ordered 3 days of high-dose IV Solu-Medrol.  Patient's vision dramatically improved and return towards baseline.  It  was  also recommended for patient to undergo angiogram and temporal biopsy.  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  Angiogram also showed approximately 50 to 70% stenosis of the left PCA P1 segment, 50% stenosis of the mid basilar artery, mild to moderate stenosis of the left MCA M1 segment and also proximal right MCA M1 segment.  It was recommended the patient be discharged on prednisone 60 mg daily for 2weeks and then reduce to prednisone 50 mg for 4 weeks and then reduce to 40 mg.  Physical therapy recommended outpatient PT.  Antiplatelet therapy was held and it was considered to start aspirin within the next several days of admission. LDL 103 and patient advised to continue lipitor 80mg .  Patient was discharged home in stable condition with  recommendations of outpatient PT.  Since discharge, patient's vision continues to improve. Temporal artery biopsy did not show definite evidence of inflammation.  He has been continuing prednisone taper.  Does have headaches daily that wax and wane and does take Tylenol that helps at times.  Does have associated photophobia but denies phonophobia or nausea/vomiting.  Blood pressure at today's visit 158/88 but checks this at home and SBP 135.  Has not been scheduled with physical therapy or occupational therapy at this time as he has been waiting on a call.  Patient has appointment with [facility] provider on 07/11/2017 for possible  aneurysm repair.  Patient asking about insomnia and denies being tested for OSA.  Patient will inquire at [facility] regarding possible referral as he is known to snore and wife states that she has heard him have apneic events.  Patient has a history of  left-sided weakness from previous stroke and feels as though this is slightly worse since hospital admission.  Also has complaints of intermittent numbness and tingling in left upper and lower extremity.  [facility] and started him on pravastatin but patient is unsure reason why.  Patient unsure of current dose of pravastatin.  Denies new or worsening stroke/TIA symptoms.   REVIEW OF SYSTEMS: Full 14 system review of systems performed and notable only for those listed, all others are neg: PTSD, activity change, appetite change, unexpected weight change, hearing loss, ringing in ears, eye discharge, eye itching, eye redness, licenses B, loss of vision, eye pain, cough, insomnia, snoring, sleep talking, acting out dreams, food allergies, frequency of urination, urgency, joint pain, joint swelling, back pain, aching muscles, muscle cramps, walking difficulty, rash, itching, memory loss, headache, numbness, speech difficulty, weakness, agitation, behavior problems, confusion, depression, and nervous less anxious    ALLERGIES: Allergies  Allergen Reactions  . Eggs Or Egg-Derived Products Swelling    SWELLING REACTION UNSPECIFIED     HOME MEDICATIONS: Outpatient Medications Prior to Visit  Medication Sig Dispense Refill  . acetaminophen (TYLENOL) 325 MG tablet Take 1,000 mg by mouth 3 (three) times daily as needed for mild pain.     Marland Kitchen albuterol-ipratropium (COMBIVENT) 18-103 MCG/ACT inhaler Inhale 1 puff into the lungs every 4 (four) hours as needed for wheezing or shortness of breath.    Marland Kitchen amLODipine (NORVASC) 10 MG tablet Take 10 mg by mouth daily.    Marland Kitchen EPINEPHrine 0.3 mg/0.3 mL IJ SOAJ injection Inject 0.3 mg into the muscle daily as needed (anaphylaxis).     Marland Kitchen escitalopram (LEXAPRO) 10 MG tablet Take 10 mg by mouth daily.    . Multiple Vitamin (MULTIVITAMIN) tablet Take 1 tablet by mouth daily.    . pantoprazole (PROTONIX) 40 MG tablet Take 1 tablet (40 mg total) by mouth  daily. 30 tablet 1  . predniSONE (DELTASONE) 10 MG tablet Take 6 tablets (60 mg total) by mouth daily with breakfast for 14 days, THEN 5 tablets (50 mg total) daily with breakfast for 14 days, THEN 4 tablets (40 mg total) daily with breakfast for 14 days. 210 tablet 0  . QVAR 80 MCG/ACT inhaler INHALE 2 PUFFS TWICE DAILY  0  . atorvastatin (LIPITOR) 80 MG tablet Take 80 mg by mouth daily.  1   No facility-administered medications prior to visit.     PAST MEDICAL HISTORY: Past Medical History:  Diagnosis Date  . Hypertension   . Stroke [facility])     PAST SURGICAL HISTORY: Past Surgical History:  Procedure Laterality Date  . ARTERY BIOPSY Bilateral 06/03/2017   Procedure: BILATERAL TEMPORAL ARTERY BIOPSY;  Surgeon: Fransisco Hertz, MD;  Location: [facility] OR;  Service: Vascular;  Laterality: Bilateral;  . IR 3D INDEPENDENT WKST  06/02/2017  . IR ANGIO INTRA EXTRACRAN SEL COM CAROTID INNOMINATE BILAT MOD SED  06/02/2017  . IR ANGIO VERTEBRAL SEL VERTEBRAL BILAT MOD SED  06/02/2017  . SHOULDER SURGERY      FAMILY HISTORY: Family History  Problem Relation Age of Onset  . Seizures Mother   . Heart failure Mother   . Stroke Brother   . Heart attack Brother  SOCIAL HISTORY: Social History   Socioeconomic History  . Marital status: Married    Spouse name: Not on file  . Number of children: Not on file  . Years of education: Not on file  . Highest education level: Not on file  Occupational History  . Not on file  Social Needs  . Financial resource strain: Not on file  . Food insecurity:    Worry: Not on file    Inability: Not on file  . Transportation needs:    Medical: Not on file    Non-medical: Not on file  Tobacco Use  . Smoking status: Former Smoker    Last attempt to quit: 01/21/1994    Years since quitting: 23.4  . Smokeless tobacco: Never Used  Substance and Sexual Activity  . Alcohol use: No  . Drug use: No  . Sexual activity: Not on file  Lifestyle  . Physical  activity:    Days per week: Not on file    Minutes per session: Not on file  . Stress: Not on file  Relationships  . Social connections:    Talks on phone: Not on file    Gets together: Not on file    Attends religious service: Not on file    Active member of club or organization: Not on file    Attends meetings of clubs or organizations: Not on file    Relationship status: Not on file  . Intimate partner violence:    Fear of current or ex partner: Not on file    Emotionally abused: Not on file    Physically abused: Not on file    Forced sexual activity: Not on file  Other Topics Concern  . Not on file  Social History Narrative  . Not on file     PHYSICAL EXAM  Vitals:   07/05/17 1018  BP: (!) 158/88  Pulse: 77  Weight: 167 lb (75.8 kg)  Height: 5\' 7"  (1.702 m)   Body mass index is 26.16 kg/m. General: well developed, well nourished pleasant African-American male, seated, in no evident distress Head: head normocephalic and atraumatic.  Neck: supple with no carotid or supraclavicular bruits Cardiovascular: regular rate and rhythm, no murmurs Musculoskeletal: no deformity Skin: no rash/petichiae Vascular: Normal pulses all extremities  Neurological examination  Mental Status: Awake and fully alert. Oriented to place and time. Recent and remote memory intact. Attention span, concentration and fund of knowledge appropriate. Mood and affect appropriate.  Cranial Nerves: Fundoscopic exam not done Pupils equal, briskly reactive to light. Extraocular movements full without nystagmus. Visual fields show partial right sided homonymous hemianopsia. Hearing intact. Facial sensation intact. Face, tongue, palate moves normally and symmetrically.  Motor: Normal bulk and tone. Normal strength in all tested extremity muscles except left shoulder.  Possible giveaway weakness observed in left upper and lower extremity Sensory.: intact to touch ,pinprick .position and vibratory  sensation. Slight decreased sensation in left upper and lower extremity Coordination: Rapid alternating movements normal in all extremities. Finger-to-nose and heel-to-shin performed accurately bilaterally. Gait and Station: Arises from chair without difficulty. Stance is normal. Gait demonstrates normal stride length and balance with assistance of cane.  Tandem gait not tested Reflexes: 1+ and symmetric. Toes downgoing.   DIAGNOSTIC DATA (LABS, IMAGING, TESTING)  Ct Head Code Stroke Wo Contrast  05/31/2017 1. Small focus of acute subarachnoid blood in the right occipital lobe, along the right parieto-occipital sulcus.  2. No associated mass effect.  3. Severe chronic white matter disease  and sequelae of prior bilateral occipital hemorrhages.   Mr Brain Wo Contrast  05/31/2017 [name] Head Wo Contrast 05/31/2017 1. No acute infarct.  2. Redemonstration of small volume right occipital subarachnoid hemorrhage.  3. Extensive bilateral posterior hemisphere hemosiderin deposition related to remote hemorrhagic events.  4. No emergent large vessel occlusion. No vascular abnormality that would explain the reported vision loss.  5. Severe chronic ischemic microangiopathy.  6. 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  Angiogram 06/02/2017 IMPRESSION: Approximately 3 mm x 2.8 mm slightly irregular bilobed saccular aneurysm arising from the superior division of the right middle cerebral artery. Approximately 50-70% stenosis of the left posterior cerebral artery P1 segment, and less so of the right posterior cerebral artery P1 segment. 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 Mild-to-moderate stenosis of the left middle cerebral artery M1 segment, and also of the proximal right middle cerebral artery M1 segment.      ASSESSMENT AND PLAN Mr. Clos is a 62 year old male with pre-medical history of previous strokes and hypertension who was recently seen in  [facility] for complaints of vision loss and incidentally found right subarachnoid hemorrhage which was determined not to be related to current symptoms and underwent angiogram along with a temporal biopsy.  Patient was discharged home with a tapering course of steroids and vision improved while he was in the hospital.  -pravstatin  for secondary stroke prevention -[facility] -[facility] about possible [facility] study referral -Prednisone taper -Advised patient that he should be hearing from neuro rehab PT OT for appointment by the end of the week and if not phone number provided to call up on Monday to schedule appointment -Maintain strict control of hypertension with blood pressure goal below 130/90, diabetes with hemoglobin A1c goal below 6.5% and cholesterol with LDL cholesterol (bad cholesterol) goal below 70 mg/dL. I also advised the patient to eat a healthy diet with plenty of whole grains, cereals, fruits and vegetables, exercise regularly and maintain ideal body weight.  Followup in the future with me in 6 months or call earlier if needed  Greater than 50% time during this 25 minute consultation visit was spent on counseling and coordination of care about HTN, HLD (risk factors), discussion about risk benefit of anticoagulation and answering questions.  George Hugh, AGNP-BC  [facility] [address] [place], [place] [address]  Phone [phone] Fax [phone]

## 2017-07-05 ENCOUNTER — Ambulatory Visit (INDEPENDENT_AMBULATORY_CARE_PROVIDER_SITE_OTHER): Payer: Medicare Other | Admitting: Adult Health

## 2017-07-05 ENCOUNTER — Encounter: Payer: Self-pay | Admitting: Adult Health

## 2017-07-05 VITALS — BP 158/88 | HR 77 | Ht 67.0 in | Wt 167.0 lb

## 2017-07-05 DIAGNOSIS — I611 Nontraumatic intracerebral hemorrhage in hemisphere, cortical: Secondary | ICD-10-CM | POA: Diagnosis not present

## 2017-07-05 DIAGNOSIS — R51 Headache: Secondary | ICD-10-CM | POA: Diagnosis not present

## 2017-07-05 DIAGNOSIS — R519 Headache, unspecified: Secondary | ICD-10-CM

## 2017-07-05 DIAGNOSIS — E785 Hyperlipidemia, unspecified: Secondary | ICD-10-CM | POA: Diagnosis not present

## 2017-07-05 DIAGNOSIS — H547 Unspecified visual loss: Secondary | ICD-10-CM | POA: Diagnosis not present

## 2017-07-05 NOTE — Patient Instructions (Addendum)
Continue pravstatin  for secondary stroke prevention  Continue to [facility] with [facility] for [facility] and blood [facility]  Ask [facility] about possible sleep study referral  Continue Prednisone taper  You should be hearing from PT/OT by Friday/Monday. If you have not heard from them, please call [phone] to schedule appointment  Maintain strict control of hypertension with blood pressure goal below 130/90, diabetes with hemoglobin A1c goal below 6.5% and cholesterol with LDL cholesterol (bad cholesterol) goal below 70 mg/dL. I also advised the patient to eat a healthy diet with plenty of whole grains, cereals, fruits and vegetables, exercise regularly and maintain ideal body weight.  Followup in the future with me in 6 months or call earlier if needed

## 2017-07-05 NOTE — Progress Notes (Signed)
I agree with the above plan 

## 2017-07-19 ENCOUNTER — Ambulatory Visit: Payer: 59 | Attending: Family Medicine | Admitting: Physical Therapy

## 2017-07-19 ENCOUNTER — Encounter: Payer: Self-pay | Admitting: Occupational Therapy

## 2017-07-19 ENCOUNTER — Other Ambulatory Visit: Payer: Self-pay

## 2017-07-19 ENCOUNTER — Ambulatory Visit: Payer: 59 | Admitting: Occupational Therapy

## 2017-07-19 ENCOUNTER — Encounter: Payer: Self-pay | Admitting: Physical Therapy

## 2017-07-19 DIAGNOSIS — I69354 Hemiplegia and hemiparesis following cerebral infarction affecting left non-dominant side: Secondary | ICD-10-CM | POA: Insufficient documentation

## 2017-07-19 DIAGNOSIS — M6281 Muscle weakness (generalized): Secondary | ICD-10-CM

## 2017-07-19 DIAGNOSIS — R2689 Other abnormalities of gait and mobility: Secondary | ICD-10-CM

## 2017-07-19 DIAGNOSIS — R2681 Unsteadiness on feet: Secondary | ICD-10-CM | POA: Diagnosis not present

## 2017-07-19 NOTE — Therapy (Signed)
[facility] [address] [place], [place], [address] Phone: [phone]   Fax:  [phone]  Occupational Therapy Evaluation  Patient Details  Name: Luis Johnson MRN: [id number] Date of Birth: 1956/01/23 Referring Provider: Wray Kearns, MD   Encounter Date: 07/19/2017  OT End of Session - 07/19/17 1329    Visit Number  1    Number of Visits  1    Date for OT Re-Evaluation  -- n/a    Authorization Type  medicare    OT Start Time  1018    OT Stop Time  1101    OT Time Calculation (min)  43 min    Activity Tolerance  Patient tolerated treatment well       Past Medical History:  Diagnosis Date  . Hypertension   . Stroke [facility])     Past Surgical History:  Procedure Laterality Date  . ARTERY BIOPSY Bilateral 06/03/2017   Procedure: BILATERAL TEMPORAL ARTERY BIOPSY;  Surgeon: Fransisco Hertz, MD;  Location: [facility] OR;  Service: Vascular;  Laterality: Bilateral;  . IR 3D INDEPENDENT WKST  06/02/2017  . IR ANGIO INTRA EXTRACRAN SEL COM CAROTID INNOMINATE BILAT MOD SED  06/02/2017  . IR ANGIO VERTEBRAL SEL VERTEBRAL BILAT MOD SED  06/02/2017  . SHOULDER SURGERY      There were no vitals filed for this visit.  Subjective Assessment - 07/19/17 1027    Subjective   My shoulder has been messed up since they did a shoulder replacement a year ago    Pertinent History  R ICH approximately 1 month ago.  Pt also with h/o of PRES.  Pt still on steroids and these will stop next week (neurologist at [facility]).      Patient Stated Goals  wash under my arm    Currently in Pain?  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 '96 due to L hand fx    [facility]      Precautions   Precautions  None      Restrictions   Weight Bearing Restrictions  No      Balance Screen   Has the patient fallen in the past 6 months  No      Home  Environment   Family/patient expects to be discharged to:  Private residence    Living Arrangements  Spouse/significant other pt was separated but wife now living with pt    Available Help at Discharge  Available PRN/intermittently works nights    Type of Home  House    Home Layout  One level    Bathroom Shower/Tub  Tub/Shower unit  Bathroom Toilet  Standard    Additional Comments  Pt has shower seat, no other equipment. Wife assisting pt in and out.       Prior Function   Level of Independence  Needs assistance with ADLs starting after shoulder replacement    Vocation  On disability    Leisure  sing, play piano, shoot pool      ADL   Eating/Feeding  Minimal assistance cutting food on plate    Grooming  Set up wife helps put toothpaste on toothbrush    Upper Body Bathing  Minimal assistance wash under left arm    Lower Body Bathing  Minimal assistance pt stands and wife washes bottom.    Upper Body Dressing  Minimal assistance assist with buttoning    Lower Body Dressing  Minimal assistance assist with belt, socks, tying shoes    Toilet Transfer  Modified independent    Toileting - Clothing Manipulation  Modified independent    Toileting -  Hygiene  Modified Independent    Tub/Shower Transfer  Min guard      IADL   Shopping  Assistance for transportation;Needs to be accompanied on any shopping trip    Meal Prep  Plans, prepares and serves adequate meals independently wife does most but pt can cook if  he has to    [employer] own vehicle    Medication Management  Is responsible for taking medication in correct dosages at correct time    Financial Management  -- wife has also handled finances      Mobility   Mobility Status  Independent uses cane      Written Expression   Dominant Hand  Right      Vision - History   Baseline Vision  Wears glasses all the time    Additional Comments  Pt wears sunglasses to block glare.       Vision Assessment   Comment  Pt states that his eye sight has returned and that with his glasses his acuity is good. Pt states he wears sunglasses to "block out the glare."        Cognition   Overall Cognitive Status  History of cognitive impairments - at baseline    Mini Mental State Exam   Pt has history of memory issues.      Behaviors  Poor frustration tolerance per pt report      Sensation   Light Touch  Impaired by gross assessment LUE and face    Hot/Cold  Impaired by gross assessment per pt report    Proprioception  Appears Intact      Coordination   Gross Motor Movements are Fluid and Coordinated  No due to shoulder replacement in 10/2016    Fine Motor Movements are Fluid and Coordinated  Yes    Other  Pt with no apparent incoordination in L hand - pt unable to do 9 hole peg due to Lshoulder limitations which pt reports have been present since 10/2016 from shoulder replacement.  Pt states he had several weeks of therapy for his shoulder and was discharged - pt also states that therapists felt they could not advance pt further with L shoulder ROM, strength or pain.  Pt states pain has not worsened with the stroke nor has AROM.  He feels that although ROM and strength are impaired he does not feel that this or the pain is any worse since his stroke.       Tone  Assessment Location  Left Upper Extremity      ROM / Strength   AROM / PROM / Strength  AROM;Strength      AROM   Overall AROM   Deficits    Overall AROM Comments  L shoulder  flexion to 70*, abduction to 45*, all other WFL's.  Pt reports this limitation is due to shoulder replacement surgery.       Strength   Overall Strength  Deficits    Overall Strength Comments  Proximal LUE weakness - did not MMT due to shoulder replacement and limited ROM as well as significant biomechanical malalignment which pt states is the same since shoulder replacement last fall.  Distal strength WFL's      Hand Function   Right Hand Gross Grasp  Functional    Right Hand Grip (lbs)  60    Left Hand Gross Grasp  Functional    Left Hand Grip (lbs)  55      LUE Tone   LUE Tone  Within Functional Limits                           OT Long Term Goals - 07/19/17 1326      OT LONG TERM GOAL #1   Title  n/a            Plan - 07/19/17 1326    Clinical Impression Statement  Pt is a 62 year old male s/p R ICH on 04/28/2017.  Pt has complicated history with 3 strokes in past two years, h/o of PRES and L shoulder replacement in 10/2016 with completed therapies for this.  Pt's primary deficit exhibited today from recent ICH is altered sensation in L hand.  Recommended that pt could seek second opinon for ROM/strength issues for L shoulder from replacement at orthopedic clinic. Pt verbalized understanding. No futher neuro OT follow up indicated at this time. Pt in agreement.     Occupational Profile and client history currently impacting functional performance  PRES, h/o of strokes, HTN, vision loss,     Rehab Potential  -- n/a    OT Frequency  -- n/a    OT Treatment/Interventions  -- n/a    Plan  no futher neuro OT follow up indicated    Clinical Decision Making  Limited treatment options, no task modification necessary    Consulted and Agree with Plan of Care  Patient;Family member/caregiver    Family Member Consulted  wife at end of session       Patient will benefit from skilled therapeutic intervention in order to improve the following deficits and impairments:   (n/a)  Visit Diagnosis: Hemiplegia and hemiparesis following cerebral infarction affecting left non-dominant side [facility])    Problem List Patient Active Problem List   Diagnosis Date Noted  . Generalized headaches 06/29/2017  . History of temporal artery biopsy 06/29/2017  . Vision loss 06/01/2017  . Depression 05/31/2017  . HLD (hyperlipidemia) 05/31/2017  . Prostatitis, acute 02/09/2016  . HTN (hypertension) 02/09/2016  . COPD (chronic obstructive pulmonary disease) (HCC) 02/09/2016  . AKI (acute kidney injury) (HCC) 02/09/2016  . Transaminitis 02/09/2016  . Sepsis (HCC)   . UTI (urinary tract infection) 02/08/2016  . Cerebral amyloid angiopathy (HCC) 11/27/2014  . 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 ICH (intracerebral hemorrhage) (HCC) 11/11/2014    Norton Pastel, OTR/L 07/19/2017, 1:31 PM  [facility] [address] [place],  [place], [address] Phone: [phone]   Fax:  [phone]  Name: Luis Johnson MRN: [id number] Date of Birth: 01-22-1956

## 2017-07-20 NOTE — Therapy (Signed)
[facility] [address] [place], [place], [address] Phone: [phone]   Fax:  [phone]  Physical Therapy Evaluation  Patient Details  Name: Luis Johnson MRN: [id number] Date of Birth: 05/12/1955 Referring Provider: Marva Panda, MD   Encounter Date: 07/19/2017  PT End of Session - 07/20/17 2136    Visit Number  1    Number of Visits  9    Authorization Type  [employer]     PT Start Time  0934    PT Stop Time  1016    PT Time Calculation (min)  42 min       Past Medical History:  Diagnosis Date  . Hypertension   . Stroke [facility])     Past Surgical History:  Procedure Laterality Date  . ARTERY BIOPSY Bilateral 06/03/2017   Procedure: BILATERAL TEMPORAL ARTERY BIOPSY;  Surgeon: Fransisco Hertz, MD;  Location: [facility] OR;  Service: Vascular;  Laterality: Bilateral;  . IR 3D INDEPENDENT WKST  06/02/2017  . IR ANGIO INTRA EXTRACRAN SEL COM CAROTID INNOMINATE BILAT MOD SED  06/02/2017  . IR ANGIO VERTEBRAL SEL VERTEBRAL BILAT MOD SED  06/02/2017  . SHOULDER SURGERY      There were no vitals filed for this visit.   Subjective Assessment - 07/20/17 1745    Subjective  Pt amb. with cane (carved walking cane) accompanied by his wife; states this is his 3rd stroke; pt was hospitalized from 3-19 until 06-03-17 with TIA and ICH; pt states this is his 3rd stroke     Patient is accompained by:  Family member wife Luis Johnson    Pertinent History  initial CVA in 2002:  went to ED on 04-28-17 with visual disturbance: 05-31-17 admitted to [facility] with intracerebral hemorrhage  - discharged on 06-03-17; Lt shoulder replacement in 10/2016    Patient Stated Goals  Be able to walk further - increase endurance    Currently in Pain?  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   No    Is the patient reluctant to leave their home because of a fear of falling?   No      Home Environment   Living Environment  Private residence    Type of Home  House    Home Access  Stairs to enter    Entrance Stairs-Number of Steps  3    Entrance Stairs-Rails  Can reach  both    Home Layout  One level      Prior Function   Level of Independence  Needs assistance with ADLs;Independent with household mobility without device;Independent with community mobility with device    Vocation  On disability    Leisure  sing, play piano, shoot pool      AROM   Overall AROM   Within functional limits for tasks performed      Strength   Overall Strength Comments  Lt hip flexors and abductors 3+/5; hip extensors not tested due to pt requesting not to lie prone       Ambulation/Gait   Ambulation/Gait  Yes    Ambulation/Gait Assistance  6: Modified independent (Device/Increase time)    Ambulation Distance (Feet)  150 Feet    Assistive device  Straight cane    Gait Pattern  Step-through pattern;Decreased hip/knee flexion - left;Decreased weight shift to left;Decreased step length - left    Ambulation Surface  Level;Indoor    Gait velocity  37.43 secs = .88 ft/sec      Standardized Balance Assessment   Standardized Balance Assessment  Timed Up and Go Test      Timed Up and Go Test   TUG  Normal TUG    Normal TUG (seconds)  36.85                 Objective measurements completed on examination: See above findings.                   PT Long Term Goals - 07/20/17 2147      PT LONG TERM GOAL #1   Title  Increase TUG score to </= 25 secs with cane to reduce fall risk and to indicate improved functional mobility.    Baseline  36.85 secs    Time  4    Period  Weeks    Status  New    Target Date  08/19/17      PT LONG TERM GOAL #2   Title  Pt will increase gait velocity from .88 ft/sec to >/= 1.2 ft/sec with SPC.    Baseline  37.43 secs = .88 ft/sec on 07-19-17    Time  4    Period  Weeks    Status  New    Target Date  08/19/17      PT LONG TERM GOAL #3   Title  Pt will increase distance in 3" walk test by at least 100' to demo improved endurance and increased gait speed.    Baseline  3" walk test to be assessed    Time  4    Period  Weeks    Status  New    Target Date  08/19/17      PT LONG TERM GOAL #4   Title  Independent in HEP for strengthening and balance exercises.     Time  4    Period  Weeks    Status  New    Target Date  08/19/17             Plan - 07/20/17 2137    Clinical Impression Statement  Pt is a 62 yr old gentleman s/p Rt ICH on 05-31-17 with hospitalization 3-19 - 06-03-17.  Pt presents with LLE weakness, gait and balance deficits.  Pt has hx of CVA (2002) and states he has used [facility] for assistance with community ambulation since that time.  Pt had visual disturbance due to TIA on 04-28-17  and went to [facility] ED.  Pt is at risk for fall per TUG score of 36.85 secs.      History and Personal Factors relevant to plan of care:  h/o CVA in 2002 (used SPC since that time);  pt states visual disturbance/deficits fluctuates (wearing sunglasses during eval due to light sensitivity); Lt shoulder replacement in Aug. 2018 with residual pain    Clinical Presentation  Evolving    Clinical Presentation due to:  Rt ICH on 05-31-17; h/o CVA in 2002 and TIA on 04-28-17    Clinical  Decision Making  Moderate    Rehab Potential  Good    PT Frequency  2x / week    PT Duration  4 weeks    PT Treatment/Interventions  ADLs/Self Care Home Management;Balance training;Neuromuscular re-education;Patient/family education;Gait training;Stair training;Therapeutic activities;Therapeutic exercise    PT Next Visit Plan  begin strengthening HEP - sit to stand, Lt SLR, hip abduction anti-gravity; bridging;  heel raises; standing balance activities - give balance exercises for HEP; do 3" walk test    PT Home Exercise Plan  see above    Recommended Other Services  OT evaluated pt but is not picking pt up for rx    Consulted and Agree with Plan of Care  Patient       Patient will benefit from skilled therapeutic intervention in order to improve the following deficits and impairments:  Abnormal gait, Decreased activity tolerance, Decreased balance, Decreased coordination, Decreased strength, Impaired vision/preception, Impaired UE functional use, Impaired sensation  Visit Diagnosis: Muscle weakness (generalized) - Plan: PT plan of care cert/re-cert  Other abnormalities of gait and mobility - Plan: PT plan of care cert/re-cert  Unsteadiness on feet - Plan: PT plan of care cert/re-cert     Problem List Patient Active Problem List   Diagnosis Date Noted  . Generalized headaches 06/29/2017  . History of temporal artery biopsy 06/29/2017  . Vision loss 06/01/2017  . Depression 05/31/2017  . HLD (hyperlipidemia) 05/31/2017  . Prostatitis, acute 02/09/2016  . HTN (hypertension) 02/09/2016  . COPD (chronic obstructive pulmonary disease) (HCC) 02/09/2016  . AKI (acute kidney injury) (HCC) 02/09/2016  . Transaminitis 02/09/2016  . Sepsis (HCC)   . UTI (urinary tract infection) 02/08/2016  . Cerebral amyloid angiopathy (HCC) 11/27/2014  . 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 ICH (intracerebral hemorrhage) (HCC) 11/11/2014    Cherisa Brucker, Donavan Burnet, PT 07/20/2017, 9:58 PM  [facility] [address] [place], [place], [address] Phone: [phone]   Fax:  [phone]  Name: Ysidro Ramsay MRN: [id number] Date of Birth: February 02, 1956

## 2017-07-29 ENCOUNTER — Encounter: Payer: Self-pay | Admitting: Physical Therapy

## 2017-07-29 ENCOUNTER — Ambulatory Visit: Payer: 59 | Admitting: Physical Therapy

## 2017-07-29 DIAGNOSIS — M6281 Muscle weakness (generalized): Secondary | ICD-10-CM | POA: Diagnosis not present

## 2017-07-29 DIAGNOSIS — R2681 Unsteadiness on feet: Secondary | ICD-10-CM

## 2017-07-29 DIAGNOSIS — I69354 Hemiplegia and hemiparesis following cerebral infarction affecting left non-dominant side: Secondary | ICD-10-CM | POA: Diagnosis not present

## 2017-07-29 DIAGNOSIS — R2689 Other abnormalities of gait and mobility: Secondary | ICD-10-CM | POA: Diagnosis not present

## 2017-07-29 NOTE — Patient Instructions (Signed)
Functional Quadriceps: Sit to Stand    Sit on edge of chair, feet flat on floor. Right foot forward of the left foot. Stand upright, extending knees fully. Repeat __10__ times per set. Do __2__ sessions per day.    Hip Side Kick    Holding a chair or your cane for balance, keep legs shoulder width apart and toes pointed forward. Lift a leg out to side, keeping knee straight. Hold for 3 seconds. Do not lean sideways. Repeat using other leg. Hold 3 seconds Repeat __10__ times each leg. Do _2___ sessions per day.     Hip Flexion (Standing)    Holding a chair or your cane for balance, keep legs shoulder width apart lift right knee upward. Hold for __3_ seconds. Repeat with other leg.. Repeat _10__ times each leg. Do __2_ times a day.     Bridging    Tighten stomach, slowly raise buttocks from floor, keeping stomach tight. Hold 3 seconds. Be sure not to hold your breath! Repeat __10__ times per set. Do __2__ sessions per day.    Abduction: Side Leg Lift (Eccentric) - Side-Lying    Lie on side. Lift top leg slightly higher than shoulder level. Keep top leg straight with body, toes pointing forward. Hold 3 seconds.  _10__ reps per set, __2_ sets per day   Single Leg - Eyes Open    Holding support (cane or counter), lift right leg while maintaining balance over other leg. Do not let the leg you lift touch the leg you are standing on.  Progress to removing hands from support surface for longer periods of time. Hold__30__ seconds. Repeat __3__ times per session. Do __1-2__ sessions per day.     Heel Raise: Bilateral (Standing)    Rise on balls of feet. Hold 3 seconds. Then lift your toes and hold 3 sec.  Repeat __10__ times per set. Do __1-2__ sessions per day.

## 2017-07-29 NOTE — Therapy (Signed)
[facility] [address] [place], [place], [address] Phone: [phone]   Fax:  [phone]  Physical Therapy Treatment  Patient Details  Name: Luis Johnson MRN: [id number] Date of Birth: Jul 12, 1955 Referring Provider: Marva Panda, MD   Encounter Date: 07/29/2017  PT End of Session - 07/29/17 0858    Visit Number  2    Number of Visits  9    Authorization Type  [employer]     PT Start Time  0800    PT Stop Time  0846    PT Time Calculation (min)  46 min    Activity Tolerance  Patient tolerated treatment well    Behavior During Therapy  [facility] for tasks assessed/performed       Past Medical History:  Diagnosis Date  . Hypertension   . Stroke [facility])     Past Surgical History:  Procedure Laterality Date  . ARTERY BIOPSY Bilateral 06/03/2017   Procedure: BILATERAL TEMPORAL ARTERY BIOPSY;  Surgeon: Fransisco Hertz, MD;  Location: [facility] OR;  Service: Vascular;  Laterality: Bilateral;  . IR 3D INDEPENDENT WKST  06/02/2017  . IR ANGIO INTRA EXTRACRAN SEL COM CAROTID INNOMINATE BILAT MOD SED  06/02/2017  . IR ANGIO VERTEBRAL SEL VERTEBRAL BILAT MOD SED  06/02/2017  . SHOULDER SURGERY      There were no vitals filed for this visit.  Subjective Assessment - 07/29/17 0758    Subjective  No falls. Most of the exercise I get is walking the dog. I try to go 100 ft twice per day. I do sit to stand 10x x 2 sets; do 2# weights for LUE (s/p shoulder replacement), (pt then proceeded throughout session to say, "oh, and I do this for my back...my shoulder" "I don't consider these exercise, it's just what I do to keep myself moving."    Patient is accompained by:  Family member wife [name]    Pertinent History  initial CVA in 2002:  went to ED on 04-28-17 with visual disturbance: 05-31-17 admitted to [facility] with intracerebral hemorrhage  - discharged on 06-03-17; Lt shoulder replacement in 10/2016    Patient Stated Goals  Be able to walk  further - increase endurance    Currently in Pain?  Yes    Pain Score  6     Pain Location  Back    Pain Orientation  Lower    Pain Descriptors / Indicators  Aching    Pain Type  Chronic pain    Pain Onset  More than a month ago    Pain Frequency  Intermittent    Aggravating Factors   weather, excessive        Treatment: Instructed in the following exercises for HEP  Functional Quadriceps: Sit to Stand    Sit on edge of chair, feet flat on floor. Right foot forward of the left foot. Stand upright, extending knees fully. Repeat __10__ times per set. Do __2__ sessions per day.    Hip Side Kick    Holding a chair or your cane for balance, keep legs shoulder width apart and toes pointed forward. Lift a leg out to side, keeping knee straight. Hold for 3 seconds. Do not lean sideways. Repeat using other leg. Hold 3 seconds Repeat __10__ times each leg. Do _2___ sessions per day.     Hip Flexion (Standing)    Holding a chair or your cane for balance, keep legs shoulder width apart lift right knee upward. Hold for __3_  seconds. Repeat with other leg.. Repeat _10__ times each leg. Do __2_ times a day.     Bridging    Tighten stomach, slowly raise buttocks from floor, keeping stomach tight. Hold 3 seconds. Be sure not to hold your breath! Repeat __10__ times per set. Do __2__ sessions per day.    Abduction: Side Leg Lift (Eccentric) - Side-Lying    Lie on side. Lift top leg slightly higher than shoulder level. Keep top leg straight with body, toes pointing forward. Hold 3 seconds.  _10__ reps per set, __2_ sets per day   Single Leg - Eyes Open    Holding support (cane or counter), lift right leg while maintaining balance over other leg. Do not let the leg you lift touch the leg you are standing on.  Progress to removing hands from support surface for longer periods of time. Hold__30__ seconds. Repeat __3__ times per session. Do __1-2__ sessions per  day.     Heel Raise: Bilateral (Standing)    Rise on balls of feet. Hold 3 seconds. Then lift your toes and hold 3 sec.  Repeat __10__ times per set. Do __1-2__ sessions per day.                       PT Education - 07/29/17 0857    Education provided  Yes    Education Details  see HEP    Person(s) Educated  Patient    Methods  Explanation;Demonstration;Verbal cues;Handout    Comprehension  Verbalized understanding;Returned demonstration;Verbal cues required          PT Long Term Goals - 07/20/17 2147      PT LONG TERM GOAL #1   Title  Increase TUG score to </= 25 secs with cane to reduce fall risk and to indicate improved functional mobility.    Baseline  36.85 secs    Time  4    Period  Weeks    Status  New    Target Date  08/19/17      PT LONG TERM GOAL #2   Title  Pt will increase gait velocity from .88 ft/sec to >/= 1.2 ft/sec with SPC.    Baseline  37.43 secs = .88 ft/sec on 07-19-17    Time  4    Period  Weeks    Status  New    Target Date  08/19/17      PT LONG TERM GOAL #3   Title  Pt will increase distance in 3" walk test by at least 100' to demo improved endurance and increased gait speed.    Baseline  3" walk test to be assessed    Time  4    Period  Weeks    Status  New    Target Date  08/19/17      PT LONG TERM GOAL #4   Title  Independent in HEP for strengthening and balance exercises.     Time  4    Period  Weeks    Status  New    Target Date  08/19/17            Plan - 07/29/17 0900    Clinical Impression Statement  Initiated instruction in HEP to address LE weakness and balance. Patient already has quite a few exercises he has done for years to help his back. Modified these exercises for technique to better target muscles and incorporate balance component. Patient highly motivated and tolerated exercises very well despite  chronic back pain.     Rehab Potential  Good    PT Frequency  2x / week    PT Duration  4  weeks    PT Treatment/Interventions  ADLs/Self Care Home Management;Balance training;Neuromuscular re-education;Patient/family education;Gait training;Stair training;Therapeutic activities;Therapeutic exercise    PT Next Visit Plan  pt had excellent technique with most HEP given 5/17 (check sidelying and standing hip abdct, otherwise just see if he has questions); do 3" walk test and set goal; standing balance activities - give balance exercises for HEP;     PT Home Exercise Plan  see above    Consulted and Agree with Plan of Care  Patient       Patient will benefit from skilled therapeutic intervention in order to improve the following deficits and impairments:  Abnormal gait, Decreased activity tolerance, Decreased balance, Decreased coordination, Decreased strength, Impaired vision/preception, Impaired UE functional use, Impaired sensation  Visit Diagnosis: Muscle weakness (generalized)  Unsteadiness on feet     Problem List Patient Active Problem List   Diagnosis Date Noted  . Generalized headaches 06/29/2017  . History of temporal artery biopsy 06/29/2017  . Vision loss 06/01/2017  . Depression 05/31/2017  . HLD (hyperlipidemia) 05/31/2017  . Prostatitis, acute 02/09/2016  . HTN (hypertension) 02/09/2016  . COPD (chronic obstructive pulmonary disease) (HCC) 02/09/2016  . AKI (acute kidney injury) (HCC) 02/09/2016  . Transaminitis 02/09/2016  . Sepsis (HCC)   . UTI (urinary tract infection) 02/08/2016  . Cerebral amyloid angiopathy (HCC) 11/27/2014  . 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 ICH (intracerebral hemorrhage) (HCC) 11/11/2014    Zena Amos, PT 07/29/2017, 9:04 AM  [facility] [address] [place], [place], [address] Phone: [phone]   Fax:  [phone]  Name: Luis Johnson MRN: [id number] Date of Birth: 11/11/1955

## 2017-08-01 ENCOUNTER — Encounter: Payer: Self-pay | Admitting: Physical Therapy

## 2017-08-01 ENCOUNTER — Ambulatory Visit: Payer: 59 | Admitting: Physical Therapy

## 2017-08-01 DIAGNOSIS — R2689 Other abnormalities of gait and mobility: Secondary | ICD-10-CM

## 2017-08-01 DIAGNOSIS — M6281 Muscle weakness (generalized): Secondary | ICD-10-CM | POA: Diagnosis not present

## 2017-08-01 DIAGNOSIS — R2681 Unsteadiness on feet: Secondary | ICD-10-CM

## 2017-08-01 DIAGNOSIS — I69354 Hemiplegia and hemiparesis following cerebral infarction affecting left non-dominant side: Secondary | ICD-10-CM | POA: Diagnosis not present

## 2017-08-01 NOTE — Therapy (Signed)
[facility] [address] [place], [place], [address] Phone: [phone]   Fax:  [phone]  Physical Therapy Treatment  Patient Details  Name: Luis Johnson MRN: [id number] Date of Birth: May 27, 1955 Referring Provider: Marva Panda, MD   Encounter Date: 08/01/2017  PT End of Session - 08/01/17 0842    Visit Number  3    Number of Visits  9    Authorization Type  [employer]Smart     PT Start Time  0800    PT Stop Time  0843    PT Time Calculation (min)  43 min    Activity Tolerance  Patient tolerated treatment well    Behavior During Therapy  [facility] for tasks assessed/performed       Past Medical History:  Diagnosis Date  . Hypertension   . Stroke [facility])     Past Surgical History:  Procedure Laterality Date  . ARTERY BIOPSY Bilateral 06/03/2017   Procedure: BILATERAL TEMPORAL ARTERY BIOPSY;  Surgeon: Fransisco Hertz, MD;  Location: [facility] OR;  Service: Vascular;  Laterality: Bilateral;  . IR 3D INDEPENDENT WKST  06/02/2017  . IR ANGIO INTRA EXTRACRAN SEL COM CAROTID INNOMINATE BILAT MOD SED  06/02/2017  . IR ANGIO VERTEBRAL SEL VERTEBRAL BILAT MOD SED  06/02/2017  . SHOULDER SURGERY      There were no vitals filed for this visit.  Subjective Assessment - 08/01/17 0759    Subjective  No changes.     Patient is accompained by:  Family member wife [name]    Pertinent History  initial CVA in 2002:  went to ED on 04-28-17 with visual disturbance: 05-31-17 admitted to [facility] with intracerebral hemorrhage  - discharged on 06-03-17; Lt shoulder replacement in 10/2016    Patient Stated Goals  Be able to walk further - increase endurance    Currently in Pain?  Yes    Pain Score  6     Pain Location  Back    Pain Orientation  Lower    Pain Descriptors / Indicators  Aching    Pain Type  Chronic pain    Pain Onset  More than a month ago    Pain Frequency  Constant         [facility] PT Assessment - 08/01/17 0001      6 Minute Walk-  Baseline   6 Minute Walk- Baseline  yes    BP (mmHg)  120/88    HR (bpm)  78    02 Sat (%RA)  99 %    Modified Borg Scale for Dyspnea  0- Nothing at all    Perceived Rate of Exertion (Borg)  9- very light      6 Minute walk- Post Test   6 Minute Walk Post Test  yes    BP (mmHg)  132/94  (Abnormal)     HR (bpm)  74    02 Sat (%RA)  98 %    Modified Borg Scale for Dyspnea  0- Nothing at all    Perceived Rate of Exertion (Borg)  11- Fairly light      6 minute walk test results    Aerobic Endurance Distance Walked  182    Endurance additional comments  Only completed ; norm for 94-64 yo male 1876 ft                   [facility] Adult PT Treatment/Exercise - 08/01/17 0001      Exercises  Exercises  Knee/Hip      Knee/Hip Exercises: Standing   Hip Abduction  Stengthening;Both;1 set;10 reps    Abduction Limitations  min cues for LE positioning to improve use of gluteus medius      Knee/Hip Exercises: Sidelying   Hip ABduction  Strengthening;Left;1 set;5 reps    Hip ABduction Limitations  assessing technique for HEP; vc for maintaining sidelying with pelvis rotated forward          Balance Exercises - 08/01/17 1859      Balance Exercises: Standing   Standing Eyes Opened  Narrow base of support (BOS);Head turns;Foam/compliant surface    Standing Eyes Closed  Narrow base of support (BOS);Head turns;Foam/compliant surface    Rockerboard  Anterior/posterior;Head turns;EO;Intermittent UE support;EC    Balance Beam  blue beam; EO head turns, step off/on anteriorly & posterrioly; EC feet apart    Retro Gait  3 reps // bars; nio UE support    Marching Limitations  slow forward march intermittent UE support        PT Education - 08/01/17 1902    Education Details  results of 3 minutes walk test    Person(s) Educated  Patient;Spouse    Methods  Explanation;Demonstration    Comprehension  Verbalized understanding          PT Long Term Goals - 07/20/17 2147       PT LONG TERM GOAL #1   Title  Increase TUG score to </= 25 secs with cane to reduce fall risk and to indicate improved functional mobility.    Baseline  36.85 secs    Time  4    Period  Weeks    Status  New    Target Date  08/19/17      PT LONG TERM GOAL #2   Title  Pt will increase gait velocity from .88 ft/sec to >/= 1.2 ft/sec with SPC.    Baseline  37.43 secs = .88 ft/sec on 07-19-17    Time  4    Period  Weeks    Status  New    Target Date  08/19/17      PT LONG TERM GOAL #3   Title  Pt will increase distance in 3" walk test by at least 100' to demo improved endurance and increased gait speed.    Baseline  3" walk test to be assessed    Time  4    Period  Weeks    Status  New    Target Date  08/19/17      PT LONG TERM GOAL #4   Title  Independent in HEP for strengthening and balance exercises.     Time  4    Period  Weeks    Status  New    Target Date  08/19/17            Plan - 08/01/17 1903    Clinical Impression Statement  Reviewed recent updates/additions to his HEP with pt still requiring min cues for alignment with hip abduction strengthening. Further assessed balance through use of compliant surfaces, various foot positions, head movements, and EO vs EC. 3 minute walk test completed. Patient continues to be very hesitant with his movements (combination of fear of falling and back pain). Anticipate pt can continue to benefit from PT with progression to using more gym equipment now that HEP is established.     Rehab Potential  Good    PT Frequency  2x / week  PT Duration  4 weeks    PT Treatment/Interventions  ADLs/Self Care Home Management;Balance training;Neuromuscular re-education;Patient/family education;Gait training;Stair training;Therapeutic activities;Therapeutic exercise    PT Next Visit Plan  standing balance activities on compliant surfaces; gait training for incr distance and velocity; leg strengthening    PT Home Exercise Plan  see above     Consulted and Agree with Plan of Care  Patient       Patient will benefit from skilled therapeutic intervention in order to improve the following deficits and impairments:  Abnormal gait, Decreased activity tolerance, Decreased balance, Decreased coordination, Decreased strength, Impaired vision/preception, Impaired UE functional use, Impaired sensation  Visit Diagnosis: Muscle weakness (generalized)  Unsteadiness on feet  Other abnormalities of gait and mobility     Problem List Patient Active Problem List   Diagnosis Date Noted  . Generalized headaches 06/29/2017  . History of temporal artery biopsy 06/29/2017  . Vision loss 06/01/2017  . Depression 05/31/2017  . HLD (hyperlipidemia) 05/31/2017  . Prostatitis, acute 02/09/2016  . HTN (hypertension) 02/09/2016  . COPD (chronic obstructive pulmonary disease) (HCC) 02/09/2016  . AKI (acute kidney injury) (HCC) 02/09/2016  . Transaminitis 02/09/2016  . Sepsis (HCC)   . UTI (urinary tract infection) 02/08/2016  . Cerebral amyloid angiopathy (HCC) 11/27/2014  . 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 ICH (intracerebral hemorrhage) (HCC) 11/11/2014    Zena Amos, PT 08/01/2017, 7:17 PM   [facility] [address] [place], [place], [address] Phone: [phone]   Fax:  [phone]  Name: Luis Johnson MRN: [id number] Date of Birth: 26-Mar-1955

## 2017-08-01 NOTE — Patient Instructions (Signed)
  Stand with your back to the corner (not touching the walls but close). Place a chair back in front of you for safety.  Feet Partial Heel-Toe, Head Motion - Eyes Closed    With eyes closed and right foot partially in front of the other hold for 30 seconds. Keep eyes closed and move head slowly, right and left (10 to each side). Then do diagonal head turns (ex. up to the left and down to the right). Then reverse (up to the right and down to the left). Take a rest and then repeat with left foot partially in front. (30 sec, head turns right and left, head turns diagonally).  Do _1___ sessions per day.  Copyright  V[employer]. All rights reserved.

## 2017-08-03 ENCOUNTER — Other Ambulatory Visit (HOSPITAL_COMMUNITY): Payer: Self-pay | Admitting: Interventional Radiology

## 2017-08-03 DIAGNOSIS — I729 Aneurysm of unspecified site: Secondary | ICD-10-CM

## 2017-08-05 ENCOUNTER — Ambulatory Visit: Payer: 59 | Admitting: Physical Therapy

## 2017-08-05 ENCOUNTER — Encounter: Payer: Self-pay | Admitting: Physical Therapy

## 2017-08-05 DIAGNOSIS — I69354 Hemiplegia and hemiparesis following cerebral infarction affecting left non-dominant side: Secondary | ICD-10-CM | POA: Diagnosis not present

## 2017-08-05 DIAGNOSIS — R2689 Other abnormalities of gait and mobility: Secondary | ICD-10-CM | POA: Diagnosis not present

## 2017-08-05 DIAGNOSIS — R2681 Unsteadiness on feet: Secondary | ICD-10-CM | POA: Diagnosis not present

## 2017-08-05 DIAGNOSIS — M6281 Muscle weakness (generalized): Secondary | ICD-10-CM | POA: Diagnosis not present

## 2017-08-05 NOTE — Therapy (Signed)
[facility] [address] [place], [place], [address] Phone: [phone]   Fax:  [phone]  Physical Therapy Treatment  Patient Details  Name: Luis Johnson MRN: [id number] Date of Birth: 1955-04-29 Referring Provider: Marva Panda, MD   Encounter Date: 08/05/2017  PT End of Session - 08/05/17 1227    Visit Number  4    Number of Visits  9    Authorization Type  [employer]     PT Start Time  0800    PT Stop Time  0843    PT Time Calculation (min)  43 min    Activity Tolerance  Patient tolerated treatment well    Behavior During Therapy  [facility] for tasks assessed/performed       Past Medical History:  Diagnosis Date  . Hypertension   . Stroke [facility])     Past Surgical History:  Procedure Laterality Date  . ARTERY BIOPSY Bilateral 06/03/2017   Procedure: BILATERAL TEMPORAL ARTERY BIOPSY;  Surgeon: Fransisco Hertz, MD;  Location: [facility] OR;  Service: Vascular;  Laterality: Bilateral;  . IR 3D INDEPENDENT WKST  06/02/2017  . IR ANGIO INTRA EXTRACRAN SEL COM CAROTID INNOMINATE BILAT MOD SED  06/02/2017  . IR ANGIO VERTEBRAL SEL VERTEBRAL BILAT MOD SED  06/02/2017  . SHOULDER SURGERY      There were no vitals filed for this visit.  Subjective Assessment - 08/05/17 0801    Subjective  No changes. When the rain is coming it shuts me down. (arthritis pain) Didn't do much yesterday    Patient is accompained by:  Family member wife [name]    Pertinent History  initial CVA in 2002:  went to ED on 04-28-17 with visual disturbance: 05-31-17 admitted to [facility] with intracerebral hemorrhage  - discharged on 06-03-17; Lt shoulder replacement in 10/2016    Patient Stated Goals  Be able to walk further - increase endurance    Currently in Pain?  Yes    Pain Score  4     Pain Location  Shoulder    Pain Orientation  Left    Pain Descriptors / Indicators  Aching    Pain Type  Chronic pain    Pain Onset  More than a month ago    Pain  Frequency  Intermittent    Aggravating Factors   weather, excessive use    Pain Relieving Factors  Tylenol                       [facility] Adult PT Treatment/Exercise - 08/05/17 0808      Ambulation/Gait   Ambulation/Gait  Yes    Ambulation/Gait Assistance  6: Modified independent (Device/Increase time)    Ambulation Distance (Feet)  120 Feet    Assistive device  Straight cane    Gait Pattern  Step-through pattern;Decreased hip/knee flexion - left;Decreased weight shift to left;Decreased step length - left    Ambulation Surface  Indoor      Knee/Hip Exercises: Aerobic   Tread Mill  3 min fwd; 1.5 min Lt, rt, reverse one UE support    Nustep  5 min L6, all 4 extremities          Balance Exercises - 08/05/17 0835      Balance Exercises: Standing   Standing Eyes Closed  Narrow base of support (BOS);Head turns;Foam/compliant surface    SLS with Vectors  Foam/compliant surface single taps, double taps, over/up with cone    Tandem Gait  Forward;Retro;4 reps    Sidestepping  Foam/compliant support;4 reps             PT Long Term Goals - 07/20/17 2147      PT LONG TERM GOAL #1   Title  Increase TUG score to </= 25 secs with cane to reduce fall risk and to indicate improved functional mobility.    Baseline  36.85 secs    Time  4    Period  Weeks    Status  New    Target Date  08/19/17      PT LONG TERM GOAL #2   Title  Pt will increase gait velocity from .88 ft/sec to >/= 1.2 ft/sec with SPC.    Baseline  37.43 secs = .88 ft/sec on 07-19-17    Time  4    Period  Weeks    Status  New    Target Date  08/19/17      PT LONG TERM GOAL #3   Title  Pt will increase distance in 3" walk test by at least 100' to demo improved endurance and increased gait speed.    Baseline  3" walk test to be assessed    Time  4    Period  Weeks    Status  New    Target Date  08/19/17      PT LONG TERM GOAL #4   Title  Independent in HEP for strengthening and balance  exercises.     Time  4    Period  Weeks    Status  New    Target Date  08/19/17            Plan - 08/05/17 1228    Clinical Impression Statement  Session focused on balance training and therapeutic exercise for strengthening. Patient did very well with increasing levels of challenge with balance activities, including ability to walk sideways and backwards (with treadmill belt reversed) on the treadmill. With treadmill feel may be able to increase his velocity in a manner where he feels safe. (and incr his time/distance). Will continue to benefit from PT to work towards STGs     Rehab Potential  Good    PT Frequency  2x / week    PT Duration  4 weeks    PT Treatment/Interventions  ADLs/Self Care Home Management;Balance training;Neuromuscular re-education;Patient/family education;Gait training;Stair training;Therapeutic activities;Therapeutic exercise    PT Next Visit Plan  standing balance activities on compliant surfaces (try red or blue mat and hurdles; black foam beam in // bars); gait training for incr distance and velocity (treadmill went well 5/24); leg strengthening    PT Home Exercise Plan  see above    Consulted and Agree with Plan of Care  Patient       Patient will benefit from skilled therapeutic intervention in order to improve the following deficits and impairments:  Abnormal gait, Decreased activity tolerance, Decreased balance, Decreased coordination, Decreased strength, Impaired vision/preception, Impaired UE functional use, Impaired sensation  Visit Diagnosis: Muscle weakness (generalized)  Unsteadiness on feet  Other abnormalities of gait and mobility     Problem List Patient Active Problem List   Diagnosis Date Noted  . Generalized headaches 06/29/2017  . History of temporal artery biopsy 06/29/2017  . Vision loss 06/01/2017  . Depression 05/31/2017  . HLD (hyperlipidemia) 05/31/2017  . Prostatitis, acute 02/09/2016  . HTN (hypertension) 02/09/2016  .  COPD (chronic obstructive pulmonary disease) (HCC) 02/09/2016  . AKI (acute kidney injury) (HCC) 02/09/2016  .  Transaminitis 02/09/2016  . Sepsis (HCC)   . UTI (urinary tract infection) 02/08/2016  . Cerebral amyloid angiopathy (HCC) 11/27/2014  . 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 ICH (intracerebral hemorrhage) (HCC) 11/11/2014    Zena Amos, PT 08/05/2017, 5:21 PM  [facility] [address] [place], [place], [address] Phone: [phone]   Fax:  [phone]  Name: Javarious Elsayed MRN: [id number] Date of Birth: 03-Aug-1955

## 2017-08-09 ENCOUNTER — Ambulatory Visit: Payer: 59 | Admitting: Physical Therapy

## 2017-08-09 DIAGNOSIS — R2689 Other abnormalities of gait and mobility: Secondary | ICD-10-CM

## 2017-08-09 DIAGNOSIS — M6281 Muscle weakness (generalized): Secondary | ICD-10-CM | POA: Diagnosis not present

## 2017-08-09 DIAGNOSIS — R2681 Unsteadiness on feet: Secondary | ICD-10-CM | POA: Diagnosis not present

## 2017-08-09 DIAGNOSIS — I69354 Hemiplegia and hemiparesis following cerebral infarction affecting left non-dominant side: Secondary | ICD-10-CM | POA: Diagnosis not present

## 2017-08-10 ENCOUNTER — Encounter: Payer: Self-pay | Admitting: Physical Therapy

## 2017-08-10 NOTE — Therapy (Signed)
[facility] [address] [place], [place], [address] Phone: [phone]   Fax:  [phone]  Physical Therapy Treatment  Patient Details  Name: Luis Johnson MRN: [id number] Date of Birth: Jul 24, 1955 Referring Provider: Marva Panda, MD   Encounter Date: 08/09/2017  PT End of Session - 08/10/17 1113    Visit Number  5    Number of Visits  9    Authorization Type  [employer]     PT Start Time  1147    PT Stop Time  1229    PT Time Calculation (min)  42 min       Past Medical History:  Diagnosis Date  . Hypertension   . Stroke [facility])     Past Surgical History:  Procedure Laterality Date  . ARTERY BIOPSY Bilateral 06/03/2017   Procedure: BILATERAL TEMPORAL ARTERY BIOPSY;  Surgeon: Fransisco Hertz, MD;  Location: [facility] OR;  Service: Vascular;  Laterality: Bilateral;  . IR 3D INDEPENDENT WKST  06/02/2017  . IR ANGIO INTRA EXTRACRAN SEL COM CAROTID INNOMINATE BILAT MOD SED  06/02/2017  . IR ANGIO VERTEBRAL SEL VERTEBRAL BILAT MOD SED  06/02/2017  . SHOULDER SURGERY      There were no vitals filed for this visit.  Subjective Assessment - 08/10/17 1106    Subjective  Pt states his problem is mostly lack of endurance; states his balance his ok, but he has always used his cane since back injury many years ago    Pertinent History  initial CVA in 2002:  went to ED on 04-28-17 with visual disturbance: 05-31-17 admitted to [facility] with intracerebral hemorrhage  - discharged on 06-03-17; Lt shoulder replacement in 10/2016    Patient Stated Goals  Be able to walk further - increase endurance    Currently in Pain?  Yes    Pain Score  3     Pain Location  Shoulder    Pain Orientation  Left    Pain Descriptors / Indicators  Aching    Pain Type  Chronic pain                       [facility] Adult PT Treatment/Exercise - 08/10/17 0001      Transfers   Transfers  Sit to Stand    Sit to Stand  6: Modified independent  (Device/Increase time)    Number of Reps  Other reps (comment) 5    Comments  no UE support used      Knee/Hip Exercises: Aerobic   Elliptical  level 1.5 x 2" forward      Knee/Hip Exercises: Machines for Strengthening   Cybex Leg Press  bil. LE's 50# 15 reps:  LLE only 50# (per pt's request to try same amount of weight) 15 reps       Knee/Hip Exercises: Standing   Heel Raises  Both;1 set;10 reps      step ups 10 reps each leg on 6" step with minimal UE support Lt unilateral heel raise 10 reps with UE support   Balance Exercises - 08/10/17 1110      Balance Exercises: Standing   Balance Beam  pt stood perpendicular on balance beam for improed hip strategy; stood partial tandem stance on balance beam in side // bars with UE support prn              Other Standing Exercises  Marching on incline in place with CGA:  cone taps progressing to tipping  over and standing cone upright       Pt performed alternate tap downs from blue balance beam with UE support prn on // bar - 5 reps each foot  Rockerboard inside // bars anterior/posterior 10 reps with minimal UE support  Pt performed crossovers front and back inside // bars 10' x 4 reps with minimal UE support      PT Long Term Goals - 07/20/17 2147      PT LONG TERM GOAL #1   Title  Increase TUG score to </= 25 secs with cane to reduce fall risk and to indicate improved functional mobility.    Baseline  36.85 secs    Time  4    Period  Weeks    Status  New    Target Date  08/19/17      PT LONG TERM GOAL #2   Title  Pt will increase gait velocity from .88 ft/sec to >/= 1.2 ft/sec with SPC.    Baseline  37.43 secs = .88 ft/sec on 07-19-17    Time  4    Period  Weeks    Status  New    Target Date  08/19/17      PT LONG TERM GOAL #3   Title  Pt will increase distance in 3" walk test by at least 100' to demo improved endurance and increased gait speed.    Baseline  3" walk test to be assessed    Time  4    Period  Weeks     Status  New    Target Date  08/19/17      PT LONG TERM GOAL #4   Title  Independent in HEP for strengthening and balance exercises.     Time  4    Period  Weeks    Status  New    Target Date  08/19/17            Plan - 08/10/17 1113    Clinical Impression Statement  Pt progressing well with balance activities - pt states he has used his cane for assistance with balance for many years (since back injury) and often times carries it with him but does not use.  Pt states main problem is decreased endurance.  Treadmill training not done in today's session due to treadmilll not working at time. Pt ready for D/C next week.    Rehab Potential  Good    PT Frequency  2x / week    PT Duration  4 weeks    PT Treatment/Interventions  ADLs/Self Care Home Management;Balance training;Neuromuscular re-education;Patient/family education;Gait training;Stair training;Therapeutic activities;Therapeutic exercise    PT Next Visit Plan  cont balance and endurance activities - treadmill (not done on 5-28 due to not working)     Financial planner with Plan of Care  Patient       Patient will benefit from skilled therapeutic intervention in order to improve the following deficits and impairments:  Abnormal gait, Decreased activity tolerance, Decreased balance, Decreased coordination, Decreased strength, Impaired vision/preception, Impaired UE functional use, Impaired sensation  Visit Diagnosis: Other abnormalities of gait and mobility  Unsteadiness on feet  Muscle weakness (generalized)     Problem List Patient Active Problem List   Diagnosis Date Noted  . Generalized headaches 06/29/2017  . History of temporal artery biopsy 06/29/2017  . Vision loss 06/01/2017  . Depression 05/31/2017  . HLD (hyperlipidemia) 05/31/2017  . Prostatitis, acute 02/09/2016  . HTN (hypertension) 02/09/2016  . COPD (  chronic obstructive pulmonary disease) (HCC) 02/09/2016  . AKI (acute kidney injury) (HCC)  02/09/2016  . Transaminitis 02/09/2016  . Sepsis (HCC)   . UTI (urinary tract infection) 02/08/2016  . Cerebral amyloid angiopathy (HCC) 11/27/2014  . 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 ICH (intracerebral hemorrhage) (HCC) 11/11/2014    Emori Mumme, Donavan Burnet, PT 08/10/2017, 11:18 AM  [facility] [address] [place], [place], [address] Phone: [phone]   Fax:  [phone]  Name: Luis Johnson MRN: [id number] Date of Birth: 1955/05/05

## 2017-08-12 ENCOUNTER — Encounter: Payer: Self-pay | Admitting: Physical Therapy

## 2017-08-12 ENCOUNTER — Ambulatory Visit: Payer: 59 | Admitting: Physical Therapy

## 2017-08-12 DIAGNOSIS — R2681 Unsteadiness on feet: Secondary | ICD-10-CM

## 2017-08-12 DIAGNOSIS — R2689 Other abnormalities of gait and mobility: Secondary | ICD-10-CM | POA: Diagnosis not present

## 2017-08-12 DIAGNOSIS — M6281 Muscle weakness (generalized): Secondary | ICD-10-CM | POA: Diagnosis not present

## 2017-08-12 DIAGNOSIS — I69354 Hemiplegia and hemiparesis following cerebral infarction affecting left non-dominant side: Secondary | ICD-10-CM | POA: Diagnosis not present

## 2017-08-12 NOTE — Therapy (Addendum)
[facility] [address] [place], [place], [address] Phone: [phone]   Fax:  [phone]  Physical Therapy Treatment  Patient Details  Name: Luis Johnson MRN: [id number] Date of Birth: Mar 26, 1955 Referring Provider: Marva PandaKimberly Millsaps, MD   Encounter Date: 08/12/2017  PT End of Session - 08/12/17 0844    Visit Number  6    Number of Visits  9    Authorization Type  [employer]Smart     PT Start Time  0800    PT Stop Time  0841    PT Time Calculation (min)  41 min    Activity Tolerance  Patient tolerated treatment well    Behavior During Therapy  [facility] for tasks assessed/performed       Past Medical History:  Diagnosis Date  . Hypertension   . Stroke [facility](HCC)     Past Surgical History:  Procedure Laterality Date  . ARTERY BIOPSY Bilateral 06/03/2017   Procedure: BILATERAL TEMPORAL ARTERY BIOPSY;  Surgeon: Fransisco Hertzhen, Brian L, MD;  [facility];  Service: Vascular;  Laterality: Bilateral;  . IR 3D INDEPENDENT WKST  06/02/2017  . IR ANGIO INTRA EXTRACRAN SEL COM CAROTID INNOMINATE BILAT MOD SED  06/02/2017  . IR ANGIO VERTEBRAL SEL VERTEBRAL BILAT MOD SED  06/02/2017  . SHOULDER SURGERY      There were no vitals filed for this visit.  Subjective Assessment - 08/12/17 0759    Subjective  No changes. good report from MD yesterday. [facility]to [facility] to aerobic equipment)    Pertinent History  initial CVA in 2002:  went to ED on 04-28-17 with visual disturbance: [facility]  - discharged on 06-03-17; Lt shoulder replacement in 10/2016    Patient Stated Goals  Be able to walk further - increase endurance    Currently in Pain?  Yes    Pain Score  5     Pain Location  Back    Pain Orientation  Lower    Pain Descriptors / Indicators  Aching    Pain Type  Chronic pain    Pain Onset  More than a month ago    Pain Frequency  Intermittent                        [facility] Adult PT Treatment/Exercise - 08/12/17 0814      Knee/Hip Exercises: Aerobic   Nustep  L8 x 12 minutes          Balance Exercises - 08/12/17 0827      Balance Exercises: Standing   Gait with Head Turns  Forward;Retro;Foam/compliant surface    Tandem Gait  Forward;Retro;Foam/compliant surface    Sidestepping  Foam/compliant support;Head turns    Other Standing Exercises  walking with ball toss x 230 ft (side to side, vertically, moving ball in "I" shape      With ankle weights and yellow band at ankles, pt demonstrated forward, sideways, and backward walking as he does at home. Recommended he stay close to counter or wall for increased safety.    PT Education - 08/12/17 [phone]    Education Details  will wrap up PT next week; doing well and cannot justify charging for PT just because the doctor says he needs it; educated on [employer] program and benefits of aerobic equipment for building endurance    Person(s) Educated  Patient;Spouse  Methods  Explanation    Comprehension  Verbalized understanding          PT Long Term Goals - 07/20/17 2147      PT LONG TERM GOAL #1   Title  Increase TUG score to </= 25 secs with cane to reduce fall risk and to indicate improved functional mobility.    Baseline  36.85 secs    Time  4    Period  Weeks    Status  New    Target Date  08/19/17      PT LONG TERM GOAL #2   Title  Pt will increase gait velocity from .88 ft/sec to >/= 1.2 ft/sec with SPC.    Baseline  37.43 secs = .88 ft/sec on 07-19-17    Time  4    Period  Weeks    Status  New    Target Date  08/19/17      PT LONG TERM GOAL #3   Title  Pt will increase distance in 3" walk test by at least 100' to demo improved endurance and increased gait speed.    Baseline  3" walk test to be assessed    Time  4    Period  Weeks    Status  New    Target Date  08/19/17      PT LONG TERM GOAL #4   Title  Independent in HEP for  strengthening and balance exercises.     Time  4    Period  Weeks    Status  New    Target Date  08/19/17            Plan - 08/12/17 1601    Clinical Impression Statement  Session included education on role of PT and inability to continue PT "because the doctor says I need it." Explained how well he is doing with his HEP and main area we have not addressed is a mechanism to improve his muscular endurance. Due to inr back pain on arrival, utilized seated stepper for conditioning (instead of treadmill). Patient tolerated well x 12 minutes with request to stop and rest at that point. Reviewed technique pt is using for some of his exercises at home (with bil 2.5# ankle weights and yellow band around ankles). He demonstrated safe performance of these exercises, however did recommend he do exercises close to a counter or wall for improved safety. Discussed plan to assess LTGs and plan for discharge next week.     Rehab Potential  Good    PT Frequency  2x / week    PT Duration  4 weeks    PT Treatment/Interventions  ADLs/Self Care Home Management;Balance training;Neuromuscular re-education;Patient/family education;Gait training;Stair training;Therapeutic activities;Therapeutic exercise    PT Next Visit Plan  [facility]? check LTGs ?d/c on 1st appt of the week; cont balance and endurance activities - treadmill      Consulted and Agree with Plan of Care  Patient       Patient will benefit from skilled therapeutic intervention in order to improve the following deficits and impairments:  Abnormal gait, Decreased activity tolerance, Decreased balance, Decreased coordination, Decreased strength, Impaired vision/preception, Impaired UE functional use, Impaired sensation  Visit Diagnosis: Unsteadiness on feet  Muscle weakness (generalized)     Problem List Patient Active Problem List   Diagnosis Date Noted  . Generalized headaches 06/29/2017  . History of  temporal artery biopsy 06/29/2017  . Vision loss 06/01/2017  .  Depression 05/31/2017  . HLD (hyperlipidemia) 05/31/2017  . Prostatitis, acute 02/09/2016  . HTN (hypertension) 02/09/2016  . COPD (chronic obstructive pulmonary disease) (HCC) 02/09/2016  . AKI (acute kidney injury) (HCC) 02/09/2016  . Transaminitis 02/09/2016  . Sepsis (HCC)   . UTI (urinary tract infection) 02/08/2016  . Cerebral amyloid angiopathy (HCC) 11/27/2014  . 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 ICH (intracerebral hemorrhage) (HCC) 11/11/2014    Zena Amos, PT 08/12/2017, 4:47 PM  [facility] [address] [place], [place], [address] Phone: [phone]   Fax:  [phone]  Name: Luis Johnson MRN: [id number] Date of Birth: 14-Apr-1955

## 2017-08-15 ENCOUNTER — Ambulatory Visit: Payer: 59 | Attending: Family Medicine | Admitting: Physical Therapy

## 2017-08-15 DIAGNOSIS — M6281 Muscle weakness (generalized): Secondary | ICD-10-CM | POA: Diagnosis present

## 2017-08-15 DIAGNOSIS — R2681 Unsteadiness on feet: Secondary | ICD-10-CM | POA: Insufficient documentation

## 2017-08-15 DIAGNOSIS — R2689 Other abnormalities of gait and mobility: Secondary | ICD-10-CM | POA: Insufficient documentation

## 2017-08-16 ENCOUNTER — Encounter: Payer: Self-pay | Admitting: Physical Therapy

## 2017-08-16 NOTE — Therapy (Signed)
[facility] [address] [place], [place], [address] Phone: [phone]   Fax:  [phone]  Physical Therapy Treatment  Patient Details  Name: Luis Johnson MRN: [id number] Date of Birth: 01/12/1956 Referring Provider: Marva Panda, MD   Encounter Date: 08/15/2017  PT End of Session - 08/16/17 1756    Visit Number  7    Number of Visits  9    Authorization Type  [employer]Smart     PT Start Time  0801    PT Stop Time  0845    PT Time Calculation (min)  44 min       Past Medical History:  Diagnosis Date  . Hypertension   . Stroke [facility])     Past Surgical History:  Procedure Laterality Date  . ARTERY BIOPSY Bilateral 06/03/2017   Procedure: BILATERAL TEMPORAL ARTERY BIOPSY;  Surgeon: Fransisco Hertz, MD;  Location: [facility] OR;  Service: Vascular;  Laterality: Bilateral;  . IR 3D INDEPENDENT WKST  06/02/2017  . IR ANGIO INTRA EXTRACRAN SEL COM CAROTID INNOMINATE BILAT MOD SED  06/02/2017  . IR ANGIO VERTEBRAL SEL VERTEBRAL BILAT MOD SED  06/02/2017  . SHOULDER SURGERY      There were no vitals filed for this visit.  Subjective Assessment - 08/16/17 1749    Subjective  Pt reports he is aching today (due to weather) - states he is trying to do more at home without his cane    Pertinent History  initial CVA in 2002:  went to ED on 04-28-17 with visual disturbance: 05-31-17 admitted to [facility] with intracerebral hemorrhage  - discharged on 06-03-17; Lt shoulder replacement in 10/2016    Patient Stated Goals  Be able to walk further - increase endurance                       [facility] Adult PT Treatment/Exercise - 08/16/17 0001      Ambulation/Gait   Ambulation/Gait  Yes    Ambulation/Gait Assistance  5: Supervision    Ambulation Distance (Feet)  50 Feet    Assistive device  None    Gait Pattern  Within Functional Limits    Ambulation Surface  Level;Indoor      Knee/Hip Exercises: Aerobic   Tread Mill  5" forward  .8 mph with bil. UE support    Recumbent Bike  SciFit level 1.5 x 5"       Knee/Hip Exercises: Standing   Heel Raises  Both;1 set;10 reps    Forward Step Up  1 set;10 reps;Step Height: 6"    Functional Squat  10 reps on Bosu inside // bars          Balance Exercises - 08/16/17 1755      Balance Exercises: Standing   Standing Eyes Opened  Narrow base of support (BOS);Foam/compliant surface;2 reps;20 secs    SLS with Vectors  Foam/compliant surface;Intermittent upper extremity assist;5 reps forward, back, and side kicks x 10 reps each    Other Standing Exercises  Marching on incline in place with CGA:  cone taps progressing to tipping over and standing cone upright              PT Long Term Goals - 07/20/17 2147      PT LONG TERM GOAL #1   Title  Increase TUG score to </= 25 secs with cane to reduce fall risk and to indicate improved functional mobility.    Baseline  36.85 secs  Time  4    Period  Weeks    Status  New    Target Date  08/19/17      PT LONG TERM GOAL #2   Title  Pt will increase gait velocity from .88 ft/sec to >/= 1.2 ft/sec with SPC.    Baseline  37.43 secs = .88 ft/sec on 07-19-17    Time  4    Period  Weeks    Status  New    Target Date  08/19/17      PT LONG TERM GOAL #3   Title  Pt will increase distance in 3" walk test by at least 100' to demo improved endurance and increased gait speed.    Baseline  3" walk test to be assessed    Time  4    Period  Weeks    Status  New    Target Date  08/19/17      PT LONG TERM GOAL #4   Title  Independent in HEP for strengthening and balance exercises.     Time  4    Period  Weeks    Status  New    Target Date  08/19/17            Plan - 08/16/17 1757    Clinical Impression Statement  Pt is demonstrating improved standing balance with pt able to maintain static standing balance on compliant surface without UE support and able to amb. short distances without cane without UE support on flat,  even surfaces.     PT Frequency  2x / week    PT Duration  4 weeks    PT Treatment/Interventions  ADLs/Self Care Home Management;Balance training;Neuromuscular re-education;Patient/family education;Gait training;Stair training;Therapeutic activities;Therapeutic exercise    PT Next Visit Plan  Check LTG's and D/C    Consulted and Agree with Plan of Care  Patient       Patient will benefit from skilled therapeutic intervention in order to improve the following deficits and impairments:  Abnormal gait, Decreased activity tolerance, Decreased balance, Decreased coordination, Decreased strength, Impaired vision/preception, Impaired UE functional use, Impaired sensation  Visit Diagnosis: Unsteadiness on feet  Muscle weakness (generalized)  Other abnormalities of gait and mobility     Problem List Patient Active Problem List   Diagnosis Date Noted  . Generalized headaches 06/29/2017  . History of temporal artery biopsy 06/29/2017  . Vision loss 06/01/2017  . Depression 05/31/2017  . HLD (hyperlipidemia) 05/31/2017  . Prostatitis, acute 02/09/2016  . HTN (hypertension) 02/09/2016  . COPD (chronic obstructive pulmonary disease) (HCC) 02/09/2016  . AKI (acute kidney injury) (HCC) 02/09/2016  . Transaminitis 02/09/2016  . Sepsis (HCC)   . UTI (urinary tract infection) 02/08/2016  . Cerebral amyloid angiopathy (HCC) 11/27/2014  . 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 ICH (intracerebral hemorrhage) (HCC) 11/11/2014    Luis Johnson, Luis Johnson, PT 08/16/2017, 6:02 PM  [facility] [address] [place], [place], [address] Phone: [phone]   Fax:  [phone]  Name: Luis Johnson MRN: [id number] Date of Birth: November 24, 1955

## 2017-08-18 ENCOUNTER — Ambulatory Visit: Payer: 59 | Admitting: Physical Therapy

## 2017-08-22 ENCOUNTER — Ambulatory Visit: Payer: Medicare Other | Admitting: Physical Therapy

## 2017-08-25 ENCOUNTER — Ambulatory Visit: Payer: Medicare Other | Admitting: Physical Therapy

## 2017-08-29 ENCOUNTER — Ambulatory Visit: Payer: Medicare Other | Admitting: Physical Therapy

## 2017-09-01 ENCOUNTER — Ambulatory Visit: Payer: Medicare Other | Admitting: Physical Therapy

## 2017-09-05 ENCOUNTER — Ambulatory Visit: Payer: Medicare Other | Admitting: Physical Therapy

## 2017-09-06 ENCOUNTER — Encounter

## 2017-09-06 ENCOUNTER — Ambulatory Visit (HOSPITAL_COMMUNITY): Admission: RE | Admit: 2017-09-06 | Payer: Medicare Other | Source: Ambulatory Visit

## 2017-09-07 ENCOUNTER — Ambulatory Visit: Payer: 59 | Admitting: Physical Therapy

## 2017-09-07 ENCOUNTER — Encounter: Payer: Self-pay | Admitting: Physical Therapy

## 2017-09-07 NOTE — Therapy (Signed)
[facility] [address] [place], [place], [address] Phone: [phone]   Fax:  [phone]  Patient Details  Name: Shivam Mestas MRN: [id number] Date of Birth: [birth year] Referring Provider:  Everardo Beals, MD   Encounter Date: 09/07/2017  PHYSICAL THERAPY DISCHARGE SUMMARY  Visits from Start of Care: 7  Current functional level related to goals / functional outcomes: Patient did not return for final visit, therefore no updated measures could be done   Remaining deficits: Patient did not return for final visit, therefore no updated measures could be done    Education / Equipment: HEP  Plan: Patient agrees to discharge.  Patient goals were not met. Patient is being discharged due to not returning since the last visit.  ?????         Rexanne Mano, PT 09/07/2017, 12:44 PM  [facility] [address] [place], [place], [address] Phone: [phone]   Fax:  [phone]

## 2017-09-08 ENCOUNTER — Ambulatory Visit: Payer: Medicare Other | Admitting: Physical Therapy

## 2017-09-12 ENCOUNTER — Ambulatory Visit: Payer: Medicare Other | Admitting: Physical Therapy

## 2017-09-13 ENCOUNTER — Ambulatory Visit: Payer: Medicare Other | Admitting: Physical Therapy

## 2017-12-21 ENCOUNTER — Emergency Department (HOSPITAL_COMMUNITY)
Admission: EM | Admit: 2017-12-21 | Discharge: 2017-12-21 | Disposition: A | Discharge status: Home / Self Care | Payer: 59 | Attending: Emergency Medicine | Admitting: Emergency Medicine

## 2017-12-21 ENCOUNTER — Other Ambulatory Visit: Payer: Self-pay

## 2017-12-21 ENCOUNTER — Emergency Department (HOSPITAL_COMMUNITY): Payer: 59

## 2017-12-21 ENCOUNTER — Encounter (HOSPITAL_COMMUNITY): Payer: Self-pay | Admitting: *Deleted

## 2017-12-21 DIAGNOSIS — R2990 Unspecified symptoms and signs involving the nervous system: Secondary | ICD-10-CM

## 2017-12-21 DIAGNOSIS — H538 Other visual disturbances: Secondary | ICD-10-CM | POA: Insufficient documentation

## 2017-12-21 DIAGNOSIS — Z87891 Personal history of nicotine dependence: Secondary | ICD-10-CM | POA: Insufficient documentation

## 2017-12-21 DIAGNOSIS — Z8673 Personal history of transient ischemic attack (TIA), and cerebral infarction without residual deficits: Secondary | ICD-10-CM | POA: Diagnosis not present

## 2017-12-21 DIAGNOSIS — Z79899 Other long term (current) drug therapy: Secondary | ICD-10-CM | POA: Diagnosis not present

## 2017-12-21 DIAGNOSIS — H539 Unspecified visual disturbance: Secondary | ICD-10-CM

## 2017-12-21 DIAGNOSIS — I10 Essential (primary) hypertension: Secondary | ICD-10-CM | POA: Diagnosis not present

## 2017-12-21 DIAGNOSIS — H53139 Sudden visual loss, unspecified eye: Secondary | ICD-10-CM | POA: Diagnosis present

## 2017-12-21 LAB — DIFFERENTIAL
Abs Immature Granulocytes: 0.01 10*3/uL (ref 0.00–0.07)
BASOS ABS: 0 10*3/uL (ref 0.0–0.1)
Basophils Relative: 1 %
EOS PCT: 2 %
Eosinophils Absolute: 0.1 10*3/uL (ref 0.0–0.5)
Immature Granulocytes: 0 %
LYMPHS ABS: 0.9 10*3/uL (ref 0.7–4.0)
LYMPHS PCT: 24 %
MONO ABS: 0.3 10*3/uL (ref 0.1–1.0)
Monocytes Relative: 8 %
NEUTROS ABS: 2.6 10*3/uL (ref 1.7–7.7)
Neutrophils Relative %: 65 %

## 2017-12-21 LAB — COMPREHENSIVE METABOLIC PANEL
ALK PHOS: 58 U/L (ref 38–126)
ALT: 22 U/L (ref 0–44)
AST: 36 U/L (ref 15–41)
Albumin: 3.8 g/dL (ref 3.5–5.0)
Anion gap: 6 (ref 5–15)
BILIRUBIN TOTAL: 1.3 mg/dL — AB (ref 0.3–1.2)
BUN: 10 mg/dL (ref 8–23)
CALCIUM: 9.6 mg/dL (ref 8.9–10.3)
CO2: 26 mmol/L (ref 22–32)
CREATININE: 0.94 mg/dL (ref 0.61–1.24)
Chloride: 105 mmol/L (ref 98–111)
GFR calc non Af Amer: >60 mL/min (ref >60)
GLUCOSE: 90 mg/dL (ref 70–99)
Potassium: 4.3 mmol/L (ref 3.5–5.1)
Sodium: 137 mmol/L (ref 135–145)
TOTAL PROTEIN: 7.5 g/dL (ref 6.5–8.1)

## 2017-12-21 LAB — URINALYSIS, ROUTINE W REFLEX MICROSCOPIC
BILIRUBIN URINE: NEGATIVE
Glucose, UA: NEGATIVE mg/dL
Hgb urine dipstick: NEGATIVE
KETONES UR: NEGATIVE mg/dL
LEUKOCYTES UA: NEGATIVE
NITRITE: NEGATIVE
Protein, ur: NEGATIVE mg/dL
Specific Gravity, Urine: 1.009 (ref 1.005–1.030)
pH: 5 (ref 5.0–8.0)

## 2017-12-21 LAB — CBC
HCT: 47.1 % (ref 39.0–52.0)
HEMOGLOBIN: 15 g/dL (ref 13.0–17.0)
MCH: 26.2 pg (ref 26.0–34.0)
MCHC: 31.8 g/dL (ref 30.0–36.0)
MCV: 82.3 fL (ref 80.0–100.0)
Platelets: 348 10*3/uL (ref 150–400)
RBC: 5.72 MIL/uL (ref 4.22–5.81)
RDW: 15 % (ref 11.5–15.5)
WBC: 4 10*3/uL (ref 4.0–10.5)
nRBC: 0 % (ref 0.0–0.2)

## 2017-12-21 LAB — I-STAT CHEM 8, ED
BUN: 17 mg/dL (ref 8–23)
CHLORIDE: 103 mmol/L (ref 98–111)
Calcium, Ion: 1.12 mmol/L — ABNORMAL LOW (ref 1.15–1.40)
Creatinine, Ser: 1 mg/dL (ref 0.61–1.24)
GLUCOSE: 87 mg/dL (ref 70–99)
HCT: 50 % (ref 39.0–52.0)
HEMOGLOBIN: 17 g/dL (ref 13.0–17.0)
Potassium: 6.1 mmol/L — ABNORMAL HIGH (ref 3.5–5.1)
SODIUM: 137 mmol/L (ref 135–145)
TCO2: 30 mmol/L (ref 22–32)

## 2017-12-21 LAB — RAPID URINE DRUG SCREEN, HOSP PERFORMED
Amphetamines: NOT DETECTED
Barbiturates: NOT DETECTED
Benzodiazepines: NOT DETECTED
Cocaine: NOT DETECTED
Opiates: NOT DETECTED
Tetrahydrocannabinol: NOT DETECTED

## 2017-12-21 LAB — I-STAT TROPONIN, ED: Troponin i, poc: 0.01 ng/mL (ref 0.00–0.08)

## 2017-12-21 LAB — ETHANOL: Alcohol, Ethyl (B): <10 mg/dL (ref <10)

## 2017-12-21 LAB — APTT: aPTT: 27 seconds (ref 24–36)

## 2017-12-21 LAB — PROTIME-INR
INR: 0.97
PROTHROMBIN TIME: 12.8 s (ref 11.4–15.2)

## 2017-12-21 MED ORDER — IOPAMIDOL (ISOVUE-370) INJECTION 76%
INTRAVENOUS | Status: AC
  Filled 2017-12-21: qty 50

## 2017-12-21 MED ORDER — IOPAMIDOL (ISOVUE-370) INJECTION 76%
50.0000 mL | Freq: Once | INTRAVENOUS | Status: AC | PRN
  Administered 2017-12-21: 50 mL via INTRAVENOUS

## 2017-12-21 NOTE — ED Notes (Signed)
Activated code stroke per RN chris

## 2017-12-21 NOTE — ED Notes (Signed)
Patient not currently in room

## 2017-12-21 NOTE — ED Notes (Signed)
Patient verbalizes understanding of discharge instructions. Opportunity for questioning and answers were provided. Armband removed by staff, pt discharged from ED to home via POV  

## 2017-12-21 NOTE — ED Notes (Signed)
Pt informed he needs to provide a UA when possible. Pt given urinal / verbalized understanding.   

## 2017-12-21 NOTE — ED Triage Notes (Signed)
The pt arrived with family nhe reports that he has a lt sided headache and lost his vision 30 mnutes ago  He reports that he cannot see anyhting from either eye  Hx os strokes that havge presented like this in the past.  Moves all four extremities  Dr Adela Lank saw in triage and called the code stgroke

## 2017-12-21 NOTE — ED Provider Notes (Signed)
[facility] EMERGENCY DEPARTMENT Provider Note   CSN: [id number] Arrival date & time: 12/21/17  1604     History   Chief Complaint Chief Complaint  Patient presents with  . Code Stroke    HPI Luis Johnson is a 62 y.o. male.  62 yo M with a cc of acute vision loss.  Started earlier today.  Had similar presentation previously.  Has had a hemorrhagic stroke with similar symptoms. Denies difficulty with speech or swallowing, has chronic left sided weakness that he thinks is worse.    The history is provided by the patient and a relative.  Illness  This is a new problem. The current episode started 1 to 2 hours ago. The problem occurs constantly. The problem has not changed since onset.Pertinent negatives include no chest pain, no abdominal pain, no headaches and no shortness of breath. Nothing aggravates the symptoms. Nothing relieves the symptoms. He has tried nothing for the symptoms. The treatment provided no relief.    Past Medical History:  Diagnosis Date  . Hypertension   . Stroke [facility])     Patient Active Problem List   Diagnosis Date Noted  . Generalized headaches 06/29/2017  . History of temporal artery biopsy 06/29/2017  . Vision loss 06/01/2017  . Depression 05/31/2017  . HLD (hyperlipidemia) 05/31/2017  . Prostatitis, acute 02/09/2016  . HTN (hypertension) 02/09/2016  . COPD (chronic obstructive pulmonary disease) (HCC) 02/09/2016  . AKI (acute kidney injury) (HCC) 02/09/2016  . Transaminitis 02/09/2016  . Sepsis (HCC)   . UTI (urinary tract infection) 02/08/2016  . Cerebral amyloid angiopathy (HCC) 11/27/2014  . 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 ICH (intracerebral hemorrhage) (HCC) 11/11/2014    Past Surgical History:  Procedure Laterality Date  . ARTERY BIOPSY Bilateral 06/03/2017   Procedure: BILATERAL TEMPORAL ARTERY BIOPSY;  Surgeon: Fransisco Hertz, MD;  Location: [facility] OR;  Service: Vascular;  Laterality: Bilateral;  . IR 3D  INDEPENDENT WKST  06/02/2017  . IR ANGIO INTRA EXTRACRAN SEL COM CAROTID INNOMINATE BILAT MOD SED  06/02/2017  . IR ANGIO VERTEBRAL SEL VERTEBRAL BILAT MOD SED  06/02/2017  . SHOULDER SURGERY          Home Medications    Prior to Admission medications   Medication Sig Start Date End Date Taking? Authorizing Provider  cephALEXin (KEFLEX) 500 MG capsule Take 500 mg by mouth every 6 (six) hours. 12/17/17  Yes [provider]  pravastatin (PRAVACHOL) 80 MG tablet Take 80 mg by mouth daily. 12/13/17  Yes [provider]  acetaminophen (TYLENOL) 325 MG tablet Take 1,000 mg by mouth 3 (three) times daily as needed for mild pain.     [provider]  acetaminophen-codeine (TYLENOL #3) 300-30 MG tablet Take 1 tablet by mouth every 8 (eight) hours as needed. Shoulder [pain    [provider]  albuterol-ipratropium (COMBIVENT) 18-103 MCG/ACT inhaler Inhale 1 puff into the lungs every 4 (four) hours as needed for wheezing or shortness of breath.    [provider]  amLODipine (NORVASC) 10 MG tablet Take 10 mg by mouth daily.    [provider]  EPINEPHrine 0.3 mg/0.3 mL IJ SOAJ injection Inject 0.3 mg into the muscle daily as needed (anaphylaxis).     [provider]  escitalopram (LEXAPRO) 10 MG tablet Take 10 mg by mouth daily.    [provider]  Multiple Vitamin (MULTIVITAMIN) tablet Take 1 tablet by mouth daily.    [provider]  pantoprazole (PROTONIX) 40 MG tablet Take 1 tablet (40 mg total) by mouth daily. 06/03/17 06/03/18  Standley Brooking, MD  QVAR 80 MCG/ACT inhaler INHALE 2 PUFFS TWICE DAILY 01/08/15   [provider]    Family History Family History  Problem Relation Age of Onset  . Seizures Mother   . Heart failure Mother   . Stroke Brother   . Heart attack Brother     Social History Social History   Tobacco Use  . Smoking status: Former Smoker    Last attempt to quit: 01/21/1994    Years  since quitting: 23.9  . Smokeless tobacco: Never Used  Substance Use Topics  . Alcohol use: No  . Drug use: No     Allergies   Eggs or egg-derived products; Acetic acid; Tea; and Other   Review of Systems Review of Systems  Constitutional: Negative for chills and fever.  HENT: Negative for congestion and facial swelling.   Eyes: Positive for visual disturbance. Negative for discharge.  Respiratory: Negative for shortness of breath.   Cardiovascular: Negative for chest pain and palpitations.  Gastrointestinal: Negative for abdominal pain, diarrhea and vomiting.  Musculoskeletal: Negative for arthralgias and myalgias.  Skin: Negative for color change and rash.  Neurological: Negative for tremors, syncope and headaches.  Psychiatric/Behavioral: Negative for confusion and dysphoric mood.     Physical Exam Updated Vital Signs BP (!) 164/84 (BP Location: Right Arm)   Pulse 77   Temp 97.8 F (36.6 C) (Oral)   Resp 18   Ht 5\' 6"  (1.676 m)   Wt 68.9 kg   SpO2 97%   BMI 24.53 kg/m   Physical Exam  Constitutional: He is oriented to person, place, and time. He appears well-developed and well-nourished.  HENT:  Head: Normocephalic and atraumatic.  Eyes: Pupils are equal, round, and reactive to light. EOM are normal.  Neck: Normal range of motion. Neck supple. No JVD present.  Cardiovascular: Normal rate and regular rhythm. Exam reveals no gallop and no friction rub.  No murmur heard. Pulmonary/Chest: No respiratory distress. He has no wheezes.  Abdominal: He exhibits no distension and no mass. There is no tenderness. There is no rebound and no guarding.  Musculoskeletal: Normal range of motion. He exhibits no edema.  Neurological: He is alert and oriented to person, place, and time.  4/5 muscle strength to the left upper and lower extremity.  Patient with decreased sensation to the left side of his face. Coordinated not tested due to loss of vision.   Skin: No rash noted. No  pallor.  Psychiatric: He has a normal mood and affect. His behavior is normal.  Nursing note and vitals reviewed.    ED Treatments / Results  Labs (all labs ordered are listed, but only abnormal results are displayed) Labs Reviewed  COMPREHENSIVE METABOLIC PANEL - Abnormal; Notable for the following components:      Result Value   Total Bilirubin 1.3 (*)    All other components within normal limits  URINALYSIS, ROUTINE W REFLEX MICROSCOPIC - Abnormal; Notable for the following components:   Color, Urine STRAW (*)    All other components within normal limits  I-STAT CHEM 8, ED - Abnormal; Notable for the following components:   Potassium 6.1 (*)    Calcium, Ion 1.12 (*)    All other components within normal limits  ETHANOL  PROTIME-INR  APTT  CBC  DIFFERENTIAL  RAPID URINE DRUG SCREEN, HOSP PERFORMED  I-STAT TROPONIN, ED  EKG EKG Interpretation  Date/Time:  Wednesday December 21 2017 16:12:29 EDT Ventricular Rate:  89 PR Interval:  162 QRS Duration: 74 QT Interval:  364 QTC Calculation: 442 R Axis:   29 Text Interpretation:  Normal sinus rhythm Right atrial enlargement Septal infarct , age undetermined Abnormal ECG No significant change since last tracing Confirmed by Melene Plan [phone]) on 12/21/2017 4:45:23 PM   Radiology Ct Angio Head W Or Wo Contrast  Result Date: 12/21/2017 CLINICAL DATA:  Stroke.  Sudden onset blindness. EXAM: CT ANGIOGRAPHY HEAD AND NECK TECHNIQUE: Multidetector CT imaging of the head and neck was performed using the standard protocol during bolus administration of intravenous contrast. Multiplanar CT image reconstructions and MIPs were obtained to evaluate the vascular anatomy. Carotid stenosis measurements (when applicable) are obtained utilizing NASCET criteria, using the distal internal carotid diameter as the denominator. CONTRAST:  50mL ISOVUE-370 IOPAMIDOL (ISOVUE-370) INJECTION 76% COMPARISON:  CT head 12/21/2017.  MRI/MRA head 05/31/2017  FINDINGS: CTA NECK FINDINGS Aortic arch: Standard branching. Imaged portion shows no evidence of aneurysm or dissection. No significant stenosis of the major arch vessel origins. Right carotid system: Right carotid widely patent. Minimal calcification right carotid bifurcation Left carotid system: Left carotid widely patent. No significant atherosclerotic disease Vertebral arteries: Both vertebral arteries are patent to the basilar. Mild calcific stenosis proximally bilaterally. Mild calcific stenosis distal left vertebral artery. Skeleton: Cervical spondylosis. No acute skeletal abnormality. Septation within the right maxillary sinus with air-fluid level. Other neck: Negative for mass or adenopathy. Upper chest: Apical emphysema without acute abnormality. Review of the MIP images confirms the above findings CTA HEAD FINDINGS Anterior circulation: Cavernous carotid widely patent bilaterally with mild calcification. Anterior and middle cerebral arteries patent without stenosis. 3 x 4 mm aneurysm right MCA bifurcation projecting anteriorly unchanged from prior studies. Posterior circulation: Both vertebral arteries are patent to the basilar. Mild stenosis distal left vertebral artery. PICA patent bilaterally. Basilar widely patent. Superior cerebellar and posterior cerebral arteries patent bilaterally. Moderate stenosis proximal left posterior cerebral artery unchanged from prior studies. Venous sinuses: Minimal opacification of the dural veins due to timing of the scan Anatomic variants: None Delayed phase: Not perform Review of the MIP images confirms the above findings IMPRESSION: 1. Negative for emergent large vessel occlusion. 2. 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 3. 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 No significant carotid or vertebral artery stenosis in the neck. 5. These results were called by telephone at the time of interpretation on 12/21/2017 at 5:06 pm to Dr.  Milon Dikes , who verbally acknowledged these results. Electronically Signed   By: Marlan Palau M.D.   On: 12/21/2017 17:10   Ct Angio Neck W Or Wo Contrast  Result Date: 12/21/2017 CLINICAL DATA:  Stroke.  Sudden onset blindness. EXAM: CT ANGIOGRAPHY HEAD AND NECK TECHNIQUE: Multidetector CT imaging of the head and neck was performed using the standard protocol during bolus administration of intravenous contrast. Multiplanar CT image reconstructions and MIPs were obtained to evaluate the vascular anatomy. Carotid stenosis measurements (when applicable) are obtained utilizing NASCET criteria, using the distal internal carotid diameter as the denominator. CONTRAST:  50mL ISOVUE-370 IOPAMIDOL (ISOVUE-370) INJECTION 76% COMPARISON:  CT head 12/21/2017.  MRI/MRA head 05/31/2017 FINDINGS: CTA NECK FINDINGS Aortic arch: Standard branching. Imaged portion shows no evidence of aneurysm or dissection. No significant stenosis of the major arch vessel origins. Right carotid system: Right carotid widely patent. Minimal calcification right carotid bifurcation Left carotid system:  Left carotid widely patent. No significant atherosclerotic disease Vertebral arteries: Both vertebral arteries are patent to the basilar. Mild calcific stenosis proximally bilaterally. Mild calcific stenosis distal left vertebral artery. Skeleton: Cervical spondylosis. No acute skeletal abnormality. Septation within the right maxillary sinus with air-fluid level. Other neck: Negative for mass or adenopathy. Upper chest: Apical emphysema without acute abnormality. Review of the MIP images confirms the above findings CTA HEAD FINDINGS Anterior circulation: Cavernous carotid widely patent bilaterally with mild calcification. Anterior and middle cerebral arteries patent without stenosis. 3 x 4 mm aneurysm right MCA bifurcation projecting anteriorly unchanged from prior studies. Posterior circulation: Both vertebral arteries are patent to the basilar.  Mild stenosis distal left vertebral artery. PICA patent bilaterally. Basilar widely patent. Superior cerebellar and posterior cerebral arteries patent bilaterally. Moderate stenosis proximal left posterior cerebral artery unchanged from prior studies. Venous sinuses: Minimal opacification of the dural veins due to timing of the scan Anatomic variants: None Delayed phase: Not perform Review of the MIP images confirms the above findings IMPRESSION: 1. Negative for emergent large vessel occlusion. 2. 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 3. 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 No significant carotid or vertebral artery stenosis in the neck. 5. These results were called by telephone at the time of interpretation on 12/21/2017 at 5:06 pm to Dr. Milon Dikes , who verbally acknowledged these results. Electronically Signed   By: Marlan Palau M.D.   On: 12/21/2017 17:10   Mr Brain Wo Contrast  Result Date: 12/21/2017 CLINICAL DATA:  62 year old male status post code stroke presentation today. Left side numbness. EXAM: MRI HEAD WITHOUT CONTRAST TECHNIQUE: Multiplanar, multiecho pulse sequences of the brain and surrounding structures were obtained without intravenous contrast. COMPARISON:  CTA head and neck and head CT without contrast earlier today. Brain MRI and intracranial MRA 05/31/2017, and earlier FINDINGS: Brain: Chronic hemorrhage and encephalomalacia in both parietal and occipital lobes. There are large number of biparietal chronic micro hemorrhages, with more macroscopic areas of chronic hemorrhage in both occipital lobes. However, much of the remainder of the brain is spared from chronic blood products. No superimposed restricted diffusion or evidence of acute infarction. No new blood products are identified since March. Confluent and widespread bilateral cerebral white matter T2 and FLAIR hyperintensity appears stable. Numerous dilated perivascular spaces throughout  the brain are stable since 2016. No new areas of signal abnormality are identified. As before, the deep gray matter nuclei, brainstem, and cerebellum are relatively spared. No midline shift, mass effect, evidence of mass lesion, ventriculomegaly, extra-axial collection or acute intracranial hemorrhage. Cervicomedullary junction and pituitary are within normal limits. Vascular: Major intracranial vascular flow voids are stable since 2016. Skull and upper cervical spine: Chronic appearing cervical vertebral compression fractures. Mild degenerative cervical spinal stenosis at C3-C4. Visualized bone marrow signal is within normal limits. Sinuses/Orbits: Stable. Other: Mastoids remain clear. Visible internal auditory structures appear normal. Scalp and face soft tissues appear negative. IMPRESSION: 1.  No acute intracranial abnormality. 2. Chronically abnormal MRI appearance of the brain with a combination of advanced biparietal and occipital chronic micro- and macroscopic hemorrhages, advanced cerebral white matter disease, and underlying dilated perivascular spaces. The deep gray matter and posterior fossa remain relatively normal. Although non-specific, consider cerebral amyloid related syndromes such as - Amyloid Angiopathy Related Inflammation ( CAARI) - Amyloid-Beta Related Angiitis (ABRA). Electronically Signed   By: Odessa Fleming M.D.   On: 12/21/2017 19:03   Ct Head Code Stroke Wo  Contrast  Result Date: 12/21/2017 CLINICAL DATA:  Code stroke. Focal neuro deficit. Left-sided numbness. Hypertension. EXAM: CT HEAD WITHOUT CONTRAST TECHNIQUE: Contiguous axial images were obtained from the base of the skull through the vertex without intravenous contrast. COMPARISON:  MRI head 05/31/2017. CT head 05/31/2017, MRI 11/11/2014 FINDINGS: Brain: Extensive diffuse white matter disease throughout the deep white matter and subcortical white matter symmetrically distributed throughout both hemispheres, unchanged from prior  studies. Focal encephalomalacia in both occipital lobes at the site of prior areas of hemorrhage based on MRI. Associated calcification left occipital lobe unchanged. Ventricle size normal. Negative for acute infarct, hemorrhage, or mass. Vascular: Negative for hyperdense vessel. Skull: Negative Sinuses/Orbits: Bony septation in the right maxillary sinus with mucosal edema and air-fluid level. Incompletely evaluated. Remaining sinuses clear. Negative orbit. Other: None ASPECTS ([place]berta Stroke Program Early CT Score) - Ganglionic level infarction (caudate, lentiform nuclei, internal capsule, insula, M1-M3 cortex): 7 - Supraganglionic infarction (M4-M6 cortex): 3 Total score (0-10 with 10 being normal): 10 IMPRESSION: 1. No change from prior studies.  No acute abnormality. 2. ASPECTS is 10 3. Severe chronic white matter changes. Encephalomalacia and chronic hemorrhage in the occipital lobes bilaterally. Question history of poorly controlled hypertension/PRES. Cerebral amyloid is another possibility for areas of chronic lobar hemorrhage. 4. These results were called by telephone at the time of interpretation on 12/21/2017 at 4:45 pm to Dr. Wilford Corner, who verbally acknowledged these results. Electronically Signed   By: Marlan Palau M.D.   On: 12/21/2017 16:46    Procedures Procedures (including critical care time)  Medications Ordered in ED Medications  iopamidol (ISOVUE-370) 76 % injection 50 mL (50 mLs Intravenous Contrast Given 12/21/17 1641)     Initial Impression / Assessment and Plan / ED Course  I have reviewed the triage vital signs and the nursing notes.  Pertinent labs & imaging results that were available during my care of the patient were reviewed by me and considered in my medical decision making (see chart for details).     62 yo M with a cc of acute visual loss.  Hx of similar previously associated with ischemic and hemorrhagic stroke.  Made a code stroke, neuro eval at bedside, recommends  MR for further evaluation.  The MRI was personally reviewed by the neurologist.  He saw no change from his prior.  At this point he does not feel that hospitalization would benefit the patient.  We will have him follow-up with his outpatient neurologist.  12:01 AM:  I have discussed the diagnosis/risks/treatment options with the patient and family and believe the pt to be eligible for discharge home to follow-up with Neuro. We also discussed returning to the ED immediately if new or worsening sx occur. We discussed the sx which are most concerning (e.g., sudden worsening weakness, difficulty with speech or swallowing) that necessitate immediate return. Medications administered to the patient during their visit and any new prescriptions provided to the patient are listed below.  Medications given during this visit Medications  iopamidol (ISOVUE-370) 76 % injection 50 mL (50 mLs Intravenous Contrast Given 12/21/17 1641)      The patient appears reasonably screen and/or stabilized for discharge and I doubt any other medical condition or other [facility] requiring further screening, evaluation, or treatment in the ED at this time prior to discharge.   Final Clinical Impressions(s) / ED Diagnoses   Final diagnoses:  Abnormal visual perception    ED Discharge Orders    None  Melene Plan, DO 12/22/17 0001

## 2017-12-21 NOTE — Consult Note (Addendum)
Neurology Consultation  Reason for Consult: Code stroke Referring Physician: Dr. Melene Plan  CC: headaches. Lt sided weakness, vision loss b/l  History is obtained from: Patient, chart  HPI: Luis Johnson is a 62 y.o. 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  Last seen normal 3:45 PM when he was the friend eating lunch and  had sudden onset of this headache and visual loss. He reports this episode being similar to the one that brought him in last time in March. He has been evaluated by neurology as an outpatient and inpatient for multiple visits- for possible giant cell arteritis and a temporal biopsy was done that was negative, possible underlying vasculitis versus amyloid as his MRI shows large burden of chronic microhemorrhages on top of the blood regulation products from his prior ICH's. His statin was discontinued.  He was given steroids at the time during last admission in March which reportedly improved his symptoms some but is presumably not on any steroids anymore.  LKW: 3:45 PM today tpa given?: no, small delta NIH, some functional component to the exam, extensive history of brain bleeds Premorbid modified Rankin scale (mRS):2  ROS: Review of system performed and negative except as noted in HPI.  Past Medical History:  Diagnosis Date  . Hypertension   . Stroke [facility])     Family History  Problem Relation Age of Onset  . Seizures Mother   . Heart failure Mother   . Stroke Brother   . Heart attack Brother     Social History:   reports that he quit smoking about 23 years ago. He has never used smokeless tobacco. He reports that he does not drink  alcohol or use drugs.  Medications No current facility-administered medications for this encounter.   Current Outpatient Medications:  .  acetaminophen (TYLENOL) 325 MG tablet, Take 1,000 mg by mouth 3 (three) times daily as needed for mild pain. , Disp: , Rfl:  .  albuterol-ipratropium (COMBIVENT) 18-103 MCG/ACT inhaler, Inhale 1 puff into the lungs every 4 (four) hours as needed for wheezing or shortness of breath., Disp: , Rfl:  .  amLODipine (NORVASC) 10 MG tablet, Take 10 mg by mouth daily., Disp: , Rfl:  .  EPINEPHrine 0.3 mg/0.3 mL IJ SOAJ injection, Inject 0.3 mg into the muscle daily as needed (anaphylaxis). , Disp: , Rfl:  .  escitalopram (LEXAPRO) 10 MG tablet, Take 10 mg by mouth daily., Disp: , Rfl:  .  Multiple Vitamin (MULTIVITAMIN) tablet, Take 1 tablet by mouth daily., Disp: , Rfl:  .  pantoprazole (PROTONIX) 40 MG tablet, Take 1 tablet (40 mg total) by mouth daily., Disp: 30 tablet, Rfl: 1 .  QVAR 80 MCG/ACT inhaler, INHALE 2 PUFFS TWICE DAILY, Disp: , Rfl: 0  Exam: Current vital signs: BP (!) 179/100 (BP Location: Right Arm)   Pulse 80   Temp 97.8 F (36.6 C) (Oral)   Resp 17   Ht 5\' 6"  (1.676 m)   Wt 68.9 kg   SpO2 100%   BMI 24.53 kg/m  Vital signs in last 24 hours: Temp:  [97.8 F (36.6 C)] 97.8 F (36.6 C) (10/09 1610) Pulse Rate:  [80] 80 (10/09 1610) Resp:  [17] 17 (10/09  1610) BP: (179)/(100) 179/100 (10/09 1610) SpO2:  [100 %] 100 % (10/09 1610) Weight:  [68.9 kg] 68.9 kg (10/09 1629)  GENERAL: Awake, alert in NAD HEENT: - Normocephalic and atraumatic, dry mm, no LN++, no Thyromegally LUNGS - Clear to auscultation bilaterally with no wheezes CV - S1S2 RRR, no m/r/g, equal pulses bilaterally. ABDOMEN - Soft, nontender, nondistended with normoactive BS Extremities: Warm well perfused with intact pulses  NEURO:  Mental Status: AA&Ox3  Language: speech is clear and not dysarthric.  Naming, repetition, fluency, and comprehension intact. Cranial  Nerves: Pupils equal round and sluggishly reactive 3 mm to 2 mm.. Visual acuity diminished to light perception in both eyes, EOMI, visual fields full that she was able to see bright flashlight in all quadrants, no facial asymmetry,facial sensation intact, hearing intact, tongue/uvula/soft palate midline, normal sternocleidomastoid and trapezius muscle strength. No evidence of tongue atrophy or fibrillations Motor: 5/5 in right upper and lower extremity.  4/5 in the left upper and lower extremity with some giveaway weakness. Tone: is normal and bulk is normal Sensation-diminished to light touch on the left hemibody Coordination: FTN intact bilaterally Gait- deferred  NIHSS 1a Level of Conscious.: 0 1b LOC Questions: 0 1c LOC Commands: 0 2 Best Gaze: 0 3 Visual: 0 4 Facial Palsy: 0 5a Motor Arm - left: 1 5b Motor Arm - Right: 0 6a Motor Leg - Left: 1 6b Motor Leg - Right: 0 7 Limb Ataxia: 0 8 Sensory: 1 9 Best Language: 0 10 Dysarthria: 0 11 Extinct. and Inatten.: 0 TOTAL: 3 Labs I have reviewed labs in epic and the results pertinent to this consultation are  CBC    Component Value Date/Time   WBC 4.0 12/21/2017 1644   RBC 5.72 12/21/2017 1644   HGB 17.0 12/21/2017 1652   HCT 50.0 12/21/2017 1652   PLT 348 12/21/2017 1644   MCV 82.3 12/21/2017 1644   MCH 26.2 12/21/2017 1644   MCHC 31.8 12/21/2017 1644   RDW 15.0 12/21/2017 1644   LYMPHSABS 0.9 12/21/2017 1644   MONOABS 0.3 12/21/2017 1644   EOSABS 0.1 12/21/2017 1644   BASOSABS 0.0 12/21/2017 1644   CMP     Component Value Date/Time   NA 137 12/21/2017 1652   K 6.1 (H) 12/21/2017 1652   CL 103 12/21/2017 1652   CO2 24 05/31/2017 1929   GLUCOSE 87 12/21/2017 1652   BUN 17 12/21/2017 1652   CREATININE 1.00 12/21/2017 1652   CALCIUM 9.2 05/31/2017 1929   PROT 8.1 05/31/2017 1929   ALBUMIN 3.9 05/31/2017 1929   AST 23 05/31/2017 1929   ALT 25 05/31/2017 1929   ALKPHOS 70 05/31/2017 1929   BILITOT 0.4 05/31/2017  1929   GFRNONAA >60 05/31/2017 1929   GFRAA >60 05/31/2017 1929    Lipid Panel     Component Value Date/Time   CHOL 169 06/01/2017 0551   TRIG 94 06/01/2017 0551   HDL 47 06/01/2017 0551   CHOLHDL 3.6 06/01/2017 0551   VLDL 19 06/01/2017 0551   LDLCALC 103 (H) 06/01/2017 0551     Imaging I have reviewed the images obtained: CT-scan of the brain-noncontrast CT of the head shows no acute changes, aspect stent, 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 CTA head and neck redemonstrates 3 mm x 4 mm aneurysm at the right MCA bifurcation projecting anteriorly unchanged from prior studies.  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  Assessment:  62 year old man  with past medical history as above presenting with sudden onset of left-sided weakness, headache and sudden visual loss in both eyes. He had mildly reactive pupils and was able to perceive light in all fields while he endorsed blindness. He has had similar symptoms in the past which might be related to his prior occipital bleeds bilaterally. He has had extensive work-up for possible GCA, which has been unrevealing. He does have an MRI that shows GRE images suggestive of old blood dilatation products as well as an amyloid-looking appearance. I am not sure if today's presentation represents a true stroke or TIA or has some nonorganic component to it. But given that he has had multiple such episodes and has severely and grossly abnormal imaging, he would benefit from a repeat MRI scan to ensure that we are not missing an acute stroke at this time. Noncontrast CT of the head does not reveal any bleed.  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  The low NIH and high risk of bleed with thrombolytics makes TPA a bad choice for him at this time.  He has no large vessel occlusion is not a candidate for EVT.  Impression: Headache Blurred vision/bilateral visual loss Left-sided weakness- chronic versus some acute worsening on chronic weakness.  Recommendations: I would like to obtain a stat MRI brain to look for any acute changes. In the absence of any acute changes on the MRI brain, I would like him to follow-up with his outpatient neurologist as it is difficult to explain his current presentation with any of the exam results that he has had and his clinical course. Certainly, there could be a nonorganic component to his presentation but he has grossly abnormal imaging and his symptoms should not be ignored.  I will follow-up after the imaging studies are made available.   -- Milon Dikes, MD [facility] Pager: [phone] If 7pm to 7am, please call on call as listed on AMION.   Addendum Patient has completed the MRI of the brain No acute changes. MRI continues to exhibit the chronic bilateral occipital micro-and microscopic hemorrhages and advanced white matter disease with underlying dilated perivascular spaces sparing the deep gray matter and posterior fossa.  He has been evaluated extensively for giant cell arteritis. Current opinion by neuroradiology is that this could be related to amyloid related inflammatory processes.  His vasculitic work-up has been negative thus far done in March 2019. His symptoms are improving though at this time.  There is minimal amount of functional overtone on his exam as well. He did not have any positive symptoms and most of the symptoms are only loss of vision so my suspicion for seizures is low.  Hence I would not recommend antiepileptic's at this time. I do not see a need to admit him and my recommendations are  below.  RECS: -Outpatient neurology follow-up with Dr. Pearlean Brownie in 2 to 4 weeks. -Outpatient EEG -Return back to ED if symptoms do not improve or remain persistent. I have relayed my plan to the ED provider in person.  -- Milon Dikes, MD [facility] Pager: [phone] If 7pm to 7am, please call on call as listed on AMION.

## 2017-12-22 NOTE — Progress Notes (Signed)
[facility]  PATIENT: Luis Johnson DOB: 01-Jul-1955   REASON FOR VISIT: right homonymous   Hemianopsia, ICH HISTORY FROM: Patient    HISTORY OF PRESENT ILLNESS:   History: Luis Johnson is an 62 y.o. male with a past medical history significant for HTN, presents to the ED 11/11/2014 for evaluation of left sided numbness, blurred vision, dysarthria.  CT brain showed acute hemorrhage involving the left parietal occipital lobe. Transthoracic echo showed normal ejection fraction without cardiac source of embolism. LDL cholesterol was significantly elevated at 231 and hemoglobin A1c was 6.1. Patient had not been on any antithrombotic prior to admission. He was found to have mild right upper quadrant visual field deficit and right facial droop.  Update 02/20/2015 PS: He returns for follow-up after last visit 3 months ago. He states he has been to the eye doctor Dr. Elmer Picker who did a computerized visual field testing and told him that he could drive. He has mild memory difficulties which are unchanged and not progressive.  He walks with a cane his balance is good and his and no recent falls. His tolerating Norvasc and Lipitor quite well without any side effects. UPDATE  07/12/2017CM  He is having left shoulder replacement at the [place] soon.  His blood pressure is elevated in office today . [facility]. He ambulates with a cane.  No recent falls.Tolerating Norvasc and Lipitor quite well without any side effects. He returns for reevaluation.  Update 07/04/17: Recent admission on 05/31/2017 for complaints of headache and bilateral visual loss.  Stat CT showed a small focus of acute subarachnoid right occipital lobe along with severe chronic white matter disease.  It was determined that right occipital subarachnoid hemorrhage was incidental finding and not related to presenting symptoms.MRI confirms small right occipital hemorrhagic hemorrhage without acute infarcts. As etiology  for vision loss remained unclear there was some concern for temporal arteritis and therefore neurology ordered 3 days of high-dose IV Solu-Medrol.  Patient's vision dramatically improved and return towards baseline.  It  was  also recommended for patient to undergo angiogram and temporal biopsy.  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  Angiogram also showed approximately 50 to 70% stenosis of the left PCA P1 segment, 50% stenosis of the mid basilar artery, mild to moderate stenosis of the left MCA M1 segment and also proximal right MCA M1 segment.  It was recommended the patient be discharged on prednisone 60 mg daily for 2weeks and then reduce to prednisone 50 mg for 4 weeks and then reduce to 40 mg.  Physical therapy recommended outpatient PT.  Antiplatelet therapy was held and it was considered to start aspirin within the next several days of admission. LDL 103 and patient advised to continue lipitor 80mg .  Patient was discharged home in stable condition with  recommendations of outpatient PT.   Since discharge, patient's vision continues to improve. Temporal artery biopsy did not show definite evidence of inflammation.  He has been continuing prednisone taper.  Does have headaches daily that wax and wane and does take Tylenol that helps at times.  Does have associated photophobia but denies phonophobia or nausea/vomiting.  Blood pressure at today's visit 158/88 but checks this at home and SBP 135.  Has not been scheduled with physical therapy or occupational therapy at this time as he has been waiting on a call.  Patient has appointment with [facility] provider on 07/11/2017 for  possible aneurysm repair.  Patient asking about insomnia and denies being tested for OSA.  Patient will inquire at [facility] regarding possible referral as he is known to snore and wife states that she has heard him have apneic events.  Patient has a history of  left-sided weakness from previous stroke and feels as though this is slightly worse since hospital admission.  Also has complaints of intermittent numbness and tingling in left upper and lower extremity.  [facility] him on [facility]rav[facility]atin but patient is unsure reason why.  Patient unsure of current dose of pravastatin.  Denies new or worsening stroke/TIA symptoms.  Interval history 12/23/2017: Patient returns today for a scheduled follow-up visit and is accompanied by his wife.  Since prior visit, patient was seen in the ED on 12/21/2017 with complaints of acute visual loss accompanied by HA. which were similar symptoms from when he had his hemorrhagic stroke.  MRI brain reviewed and did not show acute abnormality or change from prior MRI.  CTA of head and neck did not show any large vessel occlusion.  He was not a candidate to receive TPA due to multiple bleeds in the past along with low NIH.  As all imaging negative, is recommended to undergo EEG outpatient along with following up in this neurology office.  Per patient, he was out at a restaurant eating when his headache started.  The headache is located in the back occipital portion of his head and after approximately 30 minutes after headache started, his vision started becoming "foggy" and then had complete vision vision loss.  A man in the restaurant drove him to the ED and his complete vision loss lasted for approximately 30 minutes and then gradually returned.  He denies any type of chest pain, palpitations, lightheadedness or dizziness.  He also denies any type of increased stress, anger or anxiety that could have precipitated this event.  This was the same type of symptoms that occurred back in March when he was seen in the ED.  He has no history of migraines or family history of migraines.  He does state he gets occasional headaches during this time but they are not even bad enough to have to take a Tylenol for.  When he does  have these headaches, they are intense pain that are associated with sensitivity to light, "tingling" in the back of his head but denies nausea/vomiting.  He denies any recurrence of these type headaches or these headaches with the visual loss in between his March admission to this most recent admission.  He was seen by ophthalmology approximately 3 months ago and did need a new prescription with his glasses due to worsening in vision.  Blood pressure has been stable as they do monitor at home and typical SBP 1 20-1 30.  Today's blood pressure reading 144/88.  Sleep apnea testing was discussed at prior visit and as he has seen by the [facility] in [place], [place] he was instructed to follow-up with them for OSA work-up but patient states it was not brought up at most recent appointment.  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  He continues to use a cane for ambulation.  Does have chronic visual loss from prior stroke which also has not worsened per patient.  No further concerns at this time.  Denies new or worsening stroke/TIA symptoms.   REVIEW OF SYSTEMS: Full 14 system review of systems performed and notable only for  those listed, all others are neg: Unexpected weight change, hearing loss, eye itching, light sensitivity, blurred vision, insomnia, frequent waking, snoring, sleep talking, food allergies, joint pain, back pain, aching muscles, muscle cramps, walking difficulty, moles, memory loss, headache, numbness, speech difficulty, weakness, agitation, confusion, depression and hyperactive    ALLERGIES: Allergies  Allergen Reactions  . Eggs Or Egg-Derived Products Anaphylaxis and Swelling    Throat and body swells (can only tolerated boiled eggs)  . Acetic Acid Other (See Comments)    Pt had initial headache and believes it led to brain bleeds and blindness  . 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 Other Other  (See Comments)    Headaches lead to blindness oftentimes (starts with eyes fluttering, then progresses to stuttering)    HOME MEDICATIONS: Outpatient Medications Prior to Visit  Medication Sig Dispense Refill  . acetaminophen (TYLENOL) 325 MG tablet Take 1,000 mg by mouth 3 (three) times daily as needed for mild pain.     Marland Kitchen acetaminophen-codeine (TYLENOL #3) 300-30 MG tablet Take 1 tablet by mouth every 8 (eight) hours as needed. Shoulder [pain    . albuterol-ipratropium (COMBIVENT) 18-103 MCG/ACT inhaler Inhale 1 puff into the lungs every 4 (four) hours as needed for wheezing or shortness of breath.    Marland Kitchen amLODipine (NORVASC) 10 MG tablet Take 10 mg by mouth daily.    . cephALEXin (KEFLEX) 500 MG capsule Take 500 mg by mouth every 6 (six) hours.    Marland Kitchen EPINEPHrine 0.3 mg/0.3 mL IJ SOAJ injection Inject 0.3 mg into the muscle daily as needed (anaphylaxis).     Marland Kitchen escitalopram (LEXAPRO) 10 MG tablet Take 10 mg by mouth daily.    . Multiple Vitamin (MULTIVITAMIN) tablet Take 1 tablet by mouth daily.    . pantoprazole (PROTONIX) 40 MG tablet Take 1 tablet (40 mg total) by mouth daily. 30 tablet 1  . pravastatin (PRAVACHOL) 80 MG tablet Take 80 mg by mouth daily.    Marland Kitchen QVAR 80 MCG/ACT inhaler INHALE 2 PUFFS TWICE DAILY  0   No facility-administered medications prior to visit.     PAST MEDICAL HISTORY: Past Medical History:  Diagnosis Date  . Hypertension   . Stroke [facility])     PAST SURGICAL HISTORY: Past Surgical History:  Procedure Laterality Date  . ARTERY BIOPSY Bilateral 06/03/2017   Procedure: BILATERAL TEMPORAL ARTERY BIOPSY;  Surgeon: Fransisco Hertz, MD;  Location: [facility] OR;  Service: Vascular;  Laterality: Bilateral;  . IR 3D INDEPENDENT WKST  06/02/2017  . IR ANGIO INTRA EXTRACRAN SEL COM CAROTID INNOMINATE BILAT MOD SED  06/02/2017  . IR ANGIO VERTEBRAL SEL VERTEBRAL BILAT MOD SED  06/02/2017  . SHOULDER SURGERY      FAMILY HISTORY: Family History  Problem Relation Age of Onset  .  Seizures Mother   . Heart failure Mother   . Stroke Brother   . Heart attack Brother     SOCIAL HISTORY: Social History   Socioeconomic History  . Marital status: Married    Spouse name: Not on file  . Number of children: Not on file  . Years of education: Not on file  . Highest education level: Not on file  Occupational History  . Not on file  Social Needs  . Financial resource strain: Not on file  . Food insecurity:    Worry: Not on file    Inability: Not on file  . Transportation needs:  Medical: Not on file    Non-medical: Not on file  Tobacco Use  . Smoking status: Former Smoker    Last attempt to quit: 01/21/1994    Years since quitting: 23.9  . Smokeless tobacco: Never Used  Substance and Sexual Activity  . Alcohol use: No  . Drug use: No  . Sexual activity: Not on file  Lifestyle  . Physical activity:    Days per week: Not on file    Minutes per session: Not on file  . Stress: Not on file  Relationships  . Social connections:    Talks on phone: Not on file    Gets together: Not on file    Attends religious service: Not on file    Active member of club or organization: Not on file    Attends meetings of clubs or organizations: Not on file    Relationship status: Not on file  . Intimate partner violence:    Fear of current or ex partner: Not on file    Emotionally abused: Not on file    Physically abused: Not on file    Forced sexual activity: Not on file  Other Topics Concern  . Not on file  Social History Narrative  . Not on file     PHYSICAL EXAM  There were no vitals filed for this visit. There is no height or weight on file to calculate BMI. General: well developed, well nourished pleasant African-American male, seated, in no evident distress Head: head normocephalic and atraumatic.  Neck: supple with no carotid or supraclavicular bruits Cardiovascular: regular rate and rhythm, no murmurs Musculoskeletal: no deformity Skin: no  rash/petichiae Vascular: Normal pulses all extremities  Neurological examination  Mental Status: Awake and fully alert. Oriented to place and time. Recent and remote memory intact. Attention span, concentration and fund of knowledge appropriate. Mood and affect appropriate.  Cranial Nerves: Fundoscopic exam not done Pupils equal, briskly reactive to light. Extraocular movements full without nystagmus. Visual fields show partial right sided homonymous hemianopsia. Hearing intact. Facial sensation intact. Face, tongue, palate moves normally and symmetrically.  Motor: Normal bulk and tone. Normal strength in all tested extremity muscles except left shoulder.  Possible giveaway weakness observed in left upper and lower extremity Sensory.: intact to touch ,pinprick .position and vibratory sensation. Slight decreased sensation in left upper and lower extremity Coordination: Rapid alternating movements normal in all extremities. Finger-to-nose and heel-to-shin performed accurately bilaterally. Gait and Station: Arises from chair without difficulty. Stance is normal. Gait demonstrates normal stride length and balance with assistance of cane.  Tandem gait not tested Reflexes: 1+ and symmetric. Toes downgoing.   DIAGNOSTIC DATA (LABS, IMAGING, TESTING)  CT HEAD CODE STROKE WO CONTRAST 12/21/2017 IMPRESSION: 1. No change from prior studies.  No acute abnormality. 2. ASPECTS is 10 3. Severe chronic white matter changes. Encephalomalacia and chronic hemorrhage in the occipital lobes bilaterally. Question history of poorly controlled hypertension/PRES. Cerebral amyloid is another possibility for areas of chronic lobar hemorrhage.  CT ANGIO HEAD W or WO CONTRAST CT ANGIO NECK W or Wo CONTRAST 12/21/2017 IMPRESSION: 1. Negative for emergent large vessel occlusion. 2. 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 3. 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 No  significant carotid or vertebral artery stenosis in the neck.  MR BRAIN WO CONTRAST 12/21/2017 IMPRESSION: 1.  No acute intracranial abnormality. 2. Chronically abnormal MRI appearance of the brain with a combination of advanced biparietal and occipital chronic micro-  and macroscopic hemorrhages, advanced cerebral white matter disease, and underlying dilated perivascular spaces. The deep gray matter and posterior fossa remain relatively normal. Although non-specific, consider cerebral amyloid related syndromes such as - Amyloid Angiopathy Related Inflammation ( CAARI) - Amyloid-Beta Related Angiitis (ABRA).    ASSESSMENT AND PLAN Mr. Toso is a 62 year old male with pre-medical history of previous strokes and hypertension who was evaluated at [facility] on 05/31/2017 for complaints of vision loss and incidentally found right subarachnoid hemorrhage which was determined not to be related to current symptoms and underwent angiogram along with a temporal biopsy.  Patient was discharged home with a tapering course of steroids and vision improved while he was in the hospital.  Patient return to ED on 12/21/2017 with complaints of visual loss with headache that lasted approximately 2 hours total.  All imaging negative for acute infarct or hemorrhage.  Prior extensive work-up of temporal arteritis negative.  Patient returns today for follow-up visit and denies any additional episode along with denial of this episode occurring since prior admission on 05/2017.  Differential diagnosis includes amyloid angiopathy related inflammation with transient neurological symptoms with seizure activity versus complicated migraine  -continue pravstatin  for secondary stroke prevention -will start depakote 500mg  daily for possible migraine vs seizures related to visual loss and headaches. No need for EEG at this time as episodes are infrequent. Recommend to continue for at least 2 years to assess whether this medication will  prevent episodes  -[facility] along with need of [facility] for [facility] -Advised patient that he should be hearing from neuro rehab PT OT for appointment by the end of the week and if not phone number provided to call up on Monday to schedule appointment -Maintain strict control of hypertension with blood pressure goal below 130/90, diabetes with hemoglobin A1c goal below 6.5% and cholesterol with LDL cholesterol (bad cholesterol) goal below 70 mg/dL. I also advised the patient to eat a healthy diet with plenty of whole grains, cereals, fruits and vegetables, exercise regularly and maintain ideal body weight.  Followup in the future with me in 6 months or call earlier if needed  Greater than 50% time during this 25 minute consultation visit was spent on counseling and coordination of care about HTN, HLD (risk factors), discussion about risk benefit of anticoagulation and answering questions.  George Hugh, AGNP-BC  [facility] [address] [place], [place] [address]  Phone [phone] Fax [phone]

## 2017-12-23 ENCOUNTER — Encounter: Payer: Self-pay | Admitting: Adult Health

## 2017-12-23 ENCOUNTER — Ambulatory Visit (INDEPENDENT_AMBULATORY_CARE_PROVIDER_SITE_OTHER): Payer: PRIVATE HEALTH INSURANCE | Admitting: Adult Health

## 2017-12-23 ENCOUNTER — Telehealth: Payer: Self-pay

## 2017-12-23 VITALS — BP 144/88 | HR 68

## 2017-12-23 DIAGNOSIS — R51 Headache: Secondary | ICD-10-CM | POA: Diagnosis not present

## 2017-12-23 DIAGNOSIS — H547 Unspecified visual loss: Secondary | ICD-10-CM | POA: Diagnosis not present

## 2017-12-23 DIAGNOSIS — I10 Essential (primary) hypertension: Secondary | ICD-10-CM

## 2017-12-23 DIAGNOSIS — E785 Hyperlipidemia, unspecified: Secondary | ICD-10-CM

## 2017-12-23 DIAGNOSIS — R519 Headache, unspecified: Secondary | ICD-10-CM

## 2017-12-23 DIAGNOSIS — I611 Nontraumatic intracerebral hemorrhage in hemisphere, cortical: Secondary | ICD-10-CM | POA: Diagnosis not present

## 2017-12-23 MED ORDER — DIVALPROEX SODIUM ER 500 MG PO TB24: 500.0000 mg | ORAL_TABLET | Freq: Every day | ORAL | 7 refills | Status: DC

## 2017-12-23 NOTE — Progress Notes (Signed)
I agree with the above plan 

## 2017-12-23 NOTE — Patient Instructions (Addendum)
Continue pravstatin  for secondary stroke prevention  Continue to follow up with PCP regarding cholesterol and blood pressure management along with speaking with PCP regarding sleep apnea work up  We will call you to let you know what the plan will be for future management will be  Schedule appointment with eye doctor for full visual exam   Continue to monitor blood pressure at home  Maintain strict control of hypertension with blood pressure goal below 130/90, diabetes with hemoglobin A1c goal below 6.5% and cholesterol with LDL cholesterol (bad cholesterol) goal below 70 mg/dL. I also advised the patient to eat a healthy diet with plenty of whole grains, cereals, fruits and vegetables, exercise regularly and maintain ideal body weight.  Followup in the future with me in 6 months or call earlier if needed       Thank you for coming to see [place] at [facility]. I hope we have been able to provide you high quality care today.  You may receive a patient satisfaction survey over the next few weeks. We would appreciate your feedback and comments so that we may continue to improve ourselves and the health of our patients.

## 2017-12-23 NOTE — Telephone Encounter (Signed)
Depakote rx sent to [facility] at [phone]. Fax confirmed and receive twice.

## 2018-01-04 ENCOUNTER — Ambulatory Visit: Payer: Medicare Other | Admitting: Adult Health

## 2018-04-16 ENCOUNTER — Inpatient Hospital Stay (HOSPITAL_COMMUNITY): Payer: Medicare Other

## 2018-04-16 ENCOUNTER — Other Ambulatory Visit: Payer: Self-pay

## 2018-04-16 ENCOUNTER — Emergency Department (HOSPITAL_COMMUNITY): Payer: Medicare Other

## 2018-04-16 ENCOUNTER — Inpatient Hospital Stay (HOSPITAL_COMMUNITY)
Admission: EM | Admit: 2018-04-16 | Discharge: 2018-04-27 | DRG: 064 | Disposition: A | Discharge status: Skilled Nursing Facility | Payer: Medicare Other | Attending: Psychiatry | Admitting: Psychiatry

## 2018-04-16 ENCOUNTER — Encounter (HOSPITAL_COMMUNITY): Payer: Self-pay | Admitting: *Deleted

## 2018-04-16 DIAGNOSIS — I610 Nontraumatic intracerebral hemorrhage in hemisphere, subcortical: Secondary | ICD-10-CM | POA: Diagnosis present

## 2018-04-16 DIAGNOSIS — J9600 Acute respiratory failure, unspecified whether with hypoxia or hypercapnia: Secondary | ICD-10-CM | POA: Diagnosis present

## 2018-04-16 DIAGNOSIS — Z8673 Personal history of transient ischemic attack (TIA), and cerebral infarction without residual deficits: Secondary | ICD-10-CM

## 2018-04-16 DIAGNOSIS — Z8679 Personal history of other diseases of the circulatory system: Secondary | ICD-10-CM | POA: Diagnosis not present

## 2018-04-16 DIAGNOSIS — E785 Hyperlipidemia, unspecified: Secondary | ICD-10-CM | POA: Diagnosis present

## 2018-04-16 DIAGNOSIS — E854 Organ-limited amyloidosis: Secondary | ICD-10-CM | POA: Diagnosis present

## 2018-04-16 DIAGNOSIS — G8194 Hemiplegia, unspecified affecting left nondominant side: Secondary | ICD-10-CM | POA: Diagnosis present

## 2018-04-16 DIAGNOSIS — I639 Cerebral infarction, unspecified: Secondary | ICD-10-CM | POA: Diagnosis not present

## 2018-04-16 DIAGNOSIS — J9690 Respiratory failure, unspecified, unspecified whether with hypoxia or hypercapnia: Secondary | ICD-10-CM | POA: Diagnosis not present

## 2018-04-16 DIAGNOSIS — I161 Hypertensive emergency: Secondary | ICD-10-CM | POA: Diagnosis present

## 2018-04-16 DIAGNOSIS — N179 Acute kidney failure, unspecified: Secondary | ICD-10-CM | POA: Diagnosis present

## 2018-04-16 DIAGNOSIS — G936 Cerebral edema: Secondary | ICD-10-CM | POA: Diagnosis present

## 2018-04-16 DIAGNOSIS — G9340 Encephalopathy, unspecified: Secondary | ICD-10-CM | POA: Diagnosis not present

## 2018-04-16 DIAGNOSIS — E440 Moderate protein-calorie malnutrition: Secondary | ICD-10-CM | POA: Diagnosis present

## 2018-04-16 DIAGNOSIS — G935 Compression of brain: Secondary | ICD-10-CM | POA: Diagnosis present

## 2018-04-16 DIAGNOSIS — Z820 Family history of epilepsy and other diseases of the nervous system: Secondary | ICD-10-CM

## 2018-04-16 DIAGNOSIS — Z87891 Personal history of nicotine dependence: Secondary | ICD-10-CM

## 2018-04-16 DIAGNOSIS — J9601 Acute respiratory failure with hypoxia: Secondary | ICD-10-CM | POA: Diagnosis not present

## 2018-04-16 DIAGNOSIS — R402142 Coma scale, eyes open, spontaneous, at arrival to emergency department: Secondary | ICD-10-CM | POA: Diagnosis present

## 2018-04-16 DIAGNOSIS — D72829 Elevated white blood cell count, unspecified: Secondary | ICD-10-CM | POA: Diagnosis not present

## 2018-04-16 DIAGNOSIS — Z8249 Family history of ischemic heart disease and other diseases of the circulatory system: Secondary | ICD-10-CM | POA: Diagnosis not present

## 2018-04-16 DIAGNOSIS — I671 Cerebral aneurysm, nonruptured: Secondary | ICD-10-CM | POA: Diagnosis present

## 2018-04-16 DIAGNOSIS — R402222 Coma scale, best verbal response, incomprehensible words, at arrival to emergency department: Secondary | ICD-10-CM | POA: Diagnosis present

## 2018-04-16 DIAGNOSIS — D696 Thrombocytopenia, unspecified: Secondary | ICD-10-CM | POA: Diagnosis present

## 2018-04-16 DIAGNOSIS — G931 Anoxic brain damage, not elsewhere classified: Secondary | ICD-10-CM | POA: Diagnosis present

## 2018-04-16 DIAGNOSIS — I619 Nontraumatic intracerebral hemorrhage, unspecified: Secondary | ICD-10-CM | POA: Diagnosis present

## 2018-04-16 DIAGNOSIS — Z7982 Long term (current) use of aspirin: Secondary | ICD-10-CM

## 2018-04-16 DIAGNOSIS — E860 Dehydration: Secondary | ICD-10-CM | POA: Diagnosis present

## 2018-04-16 DIAGNOSIS — J811 Chronic pulmonary edema: Secondary | ICD-10-CM | POA: Diagnosis present

## 2018-04-16 DIAGNOSIS — R402332 Coma scale, best motor response, abnormal, at arrival to emergency department: Secondary | ICD-10-CM | POA: Diagnosis present

## 2018-04-16 DIAGNOSIS — I69359 Hemiplegia and hemiparesis following cerebral infarction affecting unspecified side: Secondary | ICD-10-CM

## 2018-04-16 DIAGNOSIS — R471 Dysarthria and anarthria: Secondary | ICD-10-CM | POA: Diagnosis present

## 2018-04-16 DIAGNOSIS — I611 Nontraumatic intracerebral hemorrhage in hemisphere, cortical: Secondary | ICD-10-CM | POA: Diagnosis not present

## 2018-04-16 DIAGNOSIS — E870 Hyperosmolality and hypernatremia: Secondary | ICD-10-CM | POA: Diagnosis not present

## 2018-04-16 DIAGNOSIS — R29718 NIHSS score 18: Secondary | ICD-10-CM | POA: Diagnosis present

## 2018-04-16 DIAGNOSIS — I680 Cerebral amyloid angiopathy: Secondary | ICD-10-CM | POA: Diagnosis present

## 2018-04-16 DIAGNOSIS — I509 Heart failure, unspecified: Secondary | ICD-10-CM

## 2018-04-16 DIAGNOSIS — Z823 Family history of stroke: Secondary | ICD-10-CM | POA: Diagnosis not present

## 2018-04-16 DIAGNOSIS — I10 Essential (primary) hypertension: Secondary | ICD-10-CM | POA: Diagnosis not present

## 2018-04-16 DIAGNOSIS — I5031 Acute diastolic (congestive) heart failure: Secondary | ICD-10-CM | POA: Diagnosis not present

## 2018-04-16 DIAGNOSIS — R509 Fever, unspecified: Secondary | ICD-10-CM | POA: Diagnosis not present

## 2018-04-16 DIAGNOSIS — Z6824 Body mass index (BMI) 24.0-24.9, adult: Secondary | ICD-10-CM

## 2018-04-16 DIAGNOSIS — R451 Restlessness and agitation: Secondary | ICD-10-CM | POA: Diagnosis not present

## 2018-04-16 DIAGNOSIS — R1312 Dysphagia, oropharyngeal phase: Secondary | ICD-10-CM | POA: Diagnosis present

## 2018-04-16 LAB — PROTIME-INR
INR: 0.99
Prothrombin Time: 13 seconds (ref 11.4–15.2)

## 2018-04-16 LAB — DIFFERENTIAL
ABS IMMATURE GRANULOCYTES: 0.05 10*3/uL (ref 0.00–0.07)
Basophils Absolute: 0 10*3/uL (ref 0.0–0.1)
Basophils Relative: 0 %
Eosinophils Absolute: 0 10*3/uL (ref 0.0–0.5)
Eosinophils Relative: 0 %
Immature Granulocytes: 1 %
Lymphocytes Relative: 5 %
Lymphs Abs: 0.5 10*3/uL — ABNORMAL LOW (ref 0.7–4.0)
Monocytes Absolute: 0.5 10*3/uL (ref 0.1–1.0)
Monocytes Relative: 6 %
Neutro Abs: 8 10*3/uL — ABNORMAL HIGH (ref 1.7–7.7)
Neutrophils Relative %: 88 %

## 2018-04-16 LAB — CBC
HCT: 50 % (ref 39.0–52.0)
Hemoglobin: 15.3 g/dL (ref 13.0–17.0)
MCH: 26 pg (ref 26.0–34.0)
MCHC: 30.6 g/dL (ref 30.0–36.0)
MCV: 84.9 fL (ref 80.0–100.0)
Platelets: 167 10*3/uL (ref 150–400)
RBC: 5.89 MIL/uL — AB (ref 4.22–5.81)
RDW: 16 % — ABNORMAL HIGH (ref 11.5–15.5)
WBC: 9.1 10*3/uL (ref 4.0–10.5)
nRBC: 0 % (ref 0.0–0.2)

## 2018-04-16 LAB — COMPREHENSIVE METABOLIC PANEL
ALBUMIN: 4.1 g/dL (ref 3.5–5.0)
ALT: 14 U/L (ref 0–44)
AST: 27 U/L (ref 15–41)
Alkaline Phosphatase: 44 U/L (ref 38–126)
Anion gap: 16 — ABNORMAL HIGH (ref 5–15)
BUN: 17 mg/dL (ref 8–23)
CO2: 22 mmol/L (ref 22–32)
Calcium: 9.7 mg/dL (ref 8.9–10.3)
Chloride: 100 mmol/L (ref 98–111)
Creatinine, Ser: 1.06 mg/dL (ref 0.61–1.24)
GFR calc Af Amer: >60 mL/min (ref >60)
GFR calc non Af Amer: >60 mL/min (ref >60)
GLUCOSE: 119 mg/dL — AB (ref 70–99)
Potassium: 5.3 mmol/L — ABNORMAL HIGH (ref 3.5–5.1)
Sodium: 138 mmol/L (ref 135–145)
Total Bilirubin: 1.4 mg/dL — ABNORMAL HIGH (ref 0.3–1.2)
Total Protein: 8.6 g/dL — ABNORMAL HIGH (ref 6.5–8.1)

## 2018-04-16 LAB — CBG MONITORING, ED: GLUCOSE-CAPILLARY: 112 mg/dL — AB (ref 70–99)

## 2018-04-16 LAB — POCT I-STAT 7, (LYTES, BLD GAS, ICA,H+H)
ACID-BASE EXCESS: 2 mmol/L (ref 0.0–2.0)
Bicarbonate: 28.3 mmol/L — ABNORMAL HIGH (ref 20.0–28.0)
CALCIUM ION: 1.22 mmol/L (ref 1.15–1.40)
HCT: 45 % (ref 39.0–52.0)
Hemoglobin: 15.3 g/dL (ref 13.0–17.0)
O2 SAT: 100 %
Patient temperature: 98.6
Potassium: 3.3 mmol/L — ABNORMAL LOW (ref 3.5–5.1)
Sodium: 138 mmol/L (ref 135–145)
TCO2: 30 mmol/L (ref 22–32)
pCO2 arterial: 48.2 mmHg — ABNORMAL HIGH (ref 32.0–48.0)
pH, Arterial: 7.376 (ref 7.350–7.450)
pO2, Arterial: 372 mmHg — ABNORMAL HIGH (ref 83.0–108.0)

## 2018-04-16 LAB — I-STAT TROPONIN, ED: Troponin i, poc: 0 ng/mL (ref 0.00–0.08)

## 2018-04-16 LAB — SODIUM
Sodium: 138 mmol/L (ref 135–145)
Sodium: 142 mmol/L (ref 135–145)
Sodium: 139 mmol/L (ref 135–145)

## 2018-04-16 LAB — I-STAT CREATININE, ED: Creatinine, Ser: 0.9 mg/dL (ref 0.61–1.24)

## 2018-04-16 LAB — MRSA PCR SCREENING: MRSA by PCR: NEGATIVE

## 2018-04-16 LAB — APTT: aPTT: 24 seconds (ref 24–36)

## 2018-04-16 MED ORDER — SODIUM CHLORIDE 0.9% FLUSH: 3.0000 mL | Freq: Once | INTRAVENOUS | Status: DC

## 2018-04-16 MED ORDER — ONDANSETRON HCL 4 MG/2ML IJ SOLN
4.0000 mg | Freq: Once | INTRAMUSCULAR | Status: AC
  Administered 2018-04-16: 4 mg via INTRAVENOUS
  Filled 2018-04-16: qty 2

## 2018-04-16 MED ORDER — MIDAZOLAM HCL 2 MG/2ML IJ SOLN: 2.0000 mg | Freq: Three times a day (TID) | INTRAMUSCULAR | Status: DC | PRN

## 2018-04-16 MED ORDER — CHLORHEXIDINE GLUCONATE 0.12% ORAL RINSE (MEDLINE KIT)
15.0000 mL | Freq: Two times a day (BID) | OROMUCOSAL | Status: DC
  Administered 2018-04-16 – 2018-04-21 (×10): 15 mL via OROMUCOSAL

## 2018-04-16 MED ORDER — PANTOPRAZOLE SODIUM 40 MG IV SOLR
40.0000 mg | Freq: Every day | INTRAVENOUS | Status: DC
  Administered 2018-04-16 – 2018-04-17 (×2): 40 mg via INTRAVENOUS
  Filled 2018-04-16 (×2): qty 40

## 2018-04-16 MED ORDER — STROKE: EARLY STAGES OF RECOVERY BOOK
Freq: Once | Status: DC
  Filled 2018-04-16: qty 1

## 2018-04-16 MED ORDER — LABETALOL HCL 5 MG/ML IV SOLN: 20.0000 mg | Freq: Once | INTRAVENOUS | Status: DC

## 2018-04-16 MED ORDER — ORAL CARE MOUTH RINSE
15.0000 mL | OROMUCOSAL | Status: DC
  Administered 2018-04-16 – 2018-04-21 (×47): 15 mL via OROMUCOSAL

## 2018-04-16 MED ORDER — PROPOFOL 1000 MG/100ML IV EMUL
5.0000 ug/kg/min | INTRAVENOUS | Status: DC
  Administered 2018-04-16: 20 ug/kg/min via INTRAVENOUS
  Administered 2018-04-16: 50 ug/kg/min via INTRAVENOUS
  Administered 2018-04-17: 60 ug/kg/min via INTRAVENOUS
  Administered 2018-04-17: 40 ug/kg/min via INTRAVENOUS
  Administered 2018-04-17 (×2): 50 ug/kg/min via INTRAVENOUS
  Administered 2018-04-17 – 2018-04-18 (×3): 60 ug/kg/min via INTRAVENOUS
  Administered 2018-04-18: 30 ug/kg/min via INTRAVENOUS
  Administered 2018-04-18: 35 ug/kg/min via INTRAVENOUS
  Administered 2018-04-18: 30 ug/kg/min via INTRAVENOUS
  Administered 2018-04-19: 40 ug/kg/min via INTRAVENOUS
  Filled 2018-04-16 (×14): qty 100

## 2018-04-16 MED ORDER — SODIUM CHLORIDE 3 % IV SOLN
INTRAVENOUS | Status: DC
  Administered 2018-04-16 – 2018-04-17 (×3): 75 mL/h via INTRAVENOUS
  Filled 2018-04-16 (×4): qty 500

## 2018-04-16 MED ORDER — CLEVIDIPINE BUTYRATE 0.5 MG/ML IV EMUL
0.0000 mg/h | INTRAVENOUS | Status: DC
  Administered 2018-04-16: 11 mg/h via INTRAVENOUS
  Administered 2018-04-16: 10.5 mg/h via INTRAVENOUS
  Administered 2018-04-16: 1 mg/h via INTRAVENOUS
  Administered 2018-04-16: 10.5 mg/h via INTRAVENOUS
  Administered 2018-04-16: 11 mg/h via INTRAVENOUS
  Administered 2018-04-16: 10.5 mg/h via INTRAVENOUS
  Administered 2018-04-16: 16 mg/h via INTRAVENOUS
  Administered 2018-04-17: 19 mg/h via INTRAVENOUS
  Administered 2018-04-17: 18 mg/h via INTRAVENOUS
  Administered 2018-04-17: 11 mg/h via INTRAVENOUS
  Administered 2018-04-17: 14 mg/h via INTRAVENOUS
  Administered 2018-04-17 (×2): 11 mg/h via INTRAVENOUS
  Administered 2018-04-17: 10 mg/h via INTRAVENOUS
  Administered 2018-04-17: 8 mg/h via INTRAVENOUS
  Administered 2018-04-17: 11 mg/h via INTRAVENOUS
  Administered 2018-04-18: 16 mg/h via INTRAVENOUS
  Administered 2018-04-18: 15 mg/h via INTRAVENOUS
  Administered 2018-04-18: 16 mg/h via INTRAVENOUS
  Administered 2018-04-18 (×2): 21 mg/h via INTRAVENOUS
  Administered 2018-04-18: 19 mg/h via INTRAVENOUS
  Administered 2018-04-18: 21 mg/h via INTRAVENOUS
  Administered 2018-04-18: 20 mg/h via INTRAVENOUS
  Administered 2018-04-18: 18 mg/h via INTRAVENOUS
  Administered 2018-04-19: 6 mg/h via INTRAVENOUS
  Administered 2018-04-19: 14 mg/h via INTRAVENOUS
  Administered 2018-04-19: 12 mg/h via INTRAVENOUS
  Filled 2018-04-16 (×5): qty 50
  Filled 2018-04-16: qty 100
  Filled 2018-04-16 (×12): qty 50
  Filled 2018-04-16: qty 100
  Filled 2018-04-16 (×5): qty 50

## 2018-04-16 MED ORDER — FENTANYL CITRATE (PF) 100 MCG/2ML IJ SOLN
50.0000 ug | Freq: Three times a day (TID) | INTRAMUSCULAR | Status: DC | PRN
  Administered 2018-04-16 – 2018-04-19 (×4): 50 ug via INTRAVENOUS
  Filled 2018-04-16 (×3): qty 2

## 2018-04-16 MED ORDER — SUCCINYLCHOLINE CHLORIDE 20 MG/ML IJ SOLN
100.0000 mg | Freq: Once | INTRAMUSCULAR | Status: DC
  Filled 2018-04-16: qty 5

## 2018-04-16 MED ORDER — FENTANYL CITRATE (PF) 100 MCG/2ML IJ SOLN
INTRAMUSCULAR | Status: AC
  Filled 2018-04-16: qty 2

## 2018-04-16 MED ORDER — ACETAMINOPHEN 325 MG PO TABS
650.0000 mg | ORAL_TABLET | ORAL | Status: DC | PRN
  Administered 2018-04-25 (×2): 650 mg via ORAL
  Filled 2018-04-16 (×2): qty 2

## 2018-04-16 MED ORDER — FENTANYL CITRATE (PF) 100 MCG/2ML IJ SOLN: 100.0000 ug | Freq: Once | INTRAMUSCULAR | Status: DC

## 2018-04-16 MED ORDER — SODIUM CHLORIDE 23.4 % INJECTION (4 MEQ/ML) FOR IV ADMINISTRATION
120.0000 meq | Freq: Once | INTRAVENOUS | Status: AC
  Administered 2018-04-16: 120 meq via INTRAVENOUS
  Filled 2018-04-16: qty 30

## 2018-04-16 MED ORDER — ACETAMINOPHEN 160 MG/5ML PO SOLN
650.0000 mg | ORAL | Status: DC | PRN
  Administered 2018-04-17 – 2018-04-25 (×15): 650 mg
  Filled 2018-04-16 (×14): qty 20.3

## 2018-04-16 MED ORDER — SENNOSIDES-DOCUSATE SODIUM 8.6-50 MG PO TABS
1.0000 | ORAL_TABLET | Freq: Two times a day (BID) | ORAL | Status: DC
  Administered 2018-04-17: 1 via ORAL
  Filled 2018-04-16: qty 1

## 2018-04-16 MED ORDER — LORAZEPAM 2 MG/ML IJ SOLN
INTRAMUSCULAR | Status: AC
  Administered 2018-04-16: 2 mg
  Filled 2018-04-16: qty 1

## 2018-04-16 MED ORDER — CLEVIDIPINE BUTYRATE 0.5 MG/ML IV EMUL
0.0000 mg/h | INTRAVENOUS | Status: DC
  Filled 2018-04-16 (×3): qty 50
  Filled 2018-04-16: qty 100
  Filled 2018-04-16 (×2): qty 50

## 2018-04-16 MED ORDER — ACETAMINOPHEN 650 MG RE SUPP: 650.0000 mg | RECTAL | Status: DC | PRN

## 2018-04-16 MED ORDER — SUCCINYLCHOLINE CHLORIDE 20 MG/ML IJ SOLN
INTRAMUSCULAR | Status: AC | PRN
  Administered 2018-04-16: 100 mg via INTRAVENOUS

## 2018-04-16 NOTE — ED Notes (Signed)
Dr. Lockie Mola bedside placing central line

## 2018-04-16 NOTE — ED Triage Notes (Signed)
Order per dr Lockie Mola intidal O2 started.

## 2018-04-16 NOTE — Progress Notes (Signed)
Patient transported from ED to CT and to 4N20 without complications.

## 2018-04-16 NOTE — ED Triage Notes (Signed)
PT will go tgo CT then to [facility]

## 2018-04-16 NOTE — Progress Notes (Signed)
Notified Dr. Laurence Slate of Na 139. Continue 3% NS @ 75 ml/hr

## 2018-04-16 NOTE — Progress Notes (Signed)
Informed MD Wilford Corner of Na 142--Per MD Wilford Corner, continue 3% saline at 72mL/hr

## 2018-04-16 NOTE — ED Notes (Signed)
This RN spoke with Amy in pharmacy regarding hypertonic saline doses. Pharmacy suggests calling neurologist for clarification of orders.

## 2018-04-16 NOTE — ED Provider Notes (Addendum)
[facility] Note   CSN: [id number] Arrival date & time: 04/16/18  0707     History   Chief Complaint Chief Complaint  Patient presents with  . Code Stroke    HPI Luis Johnson is a 63 y.o. male.  Level 5 caveat due to altered mental status.  EMS states that patient's last well-known was about 12 hours ago.  Patient was found by family member on the floor and altered.  Patient was not moving their left side.  Code stroke was initiated in the field.  Patient arrives with left-sided paralysis, however airway appears intact.  Patient with strokelike symptoms.  Has a history of the same.  Neurology at the bedside upon arrival.  Patient appears to be moving his right upper and right lower extremity without issues.  Appears to have aphasia.  The history is provided by the EMS personnel.  Neurologic Problem  This is a new problem. The current episode started 6 to 12 hours ago. The problem occurs constantly. The problem has not changed since onset.He has tried nothing for the symptoms. The treatment provided no relief.    Past Medical History:  Diagnosis Date  . Hypertension   . Stroke [facility](HCC)     Patient Active Problem List   Diagnosis Date Noted  . Generalized headaches 06/29/2017  . History of temporal artery biopsy 06/29/2017  . Vision loss 06/01/2017  . Depression 05/31/2017  . HLD (hyperlipidemia) 05/31/2017  . Prostatitis, acute 02/09/2016  . HTN (hypertension) 02/09/2016  . COPD (chronic obstructive pulmonary disease) (HCC) 02/09/2016  . AKI (acute kidney injury) (HCC) 02/09/2016  . Transaminitis 02/09/2016  . Sepsis (HCC)   . UTI (urinary tract infection) 02/08/2016  . Cerebral amyloid angiopathy (HCC) 11/27/2014  . 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 ICH (intracerebral hemorrhage) (HCC) 11/11/2014    Past Surgical History:  Procedure Laterality Date  . ARTERY BIOPSY Bilateral 06/03/2017   Procedure: BILATERAL TEMPORAL  ARTERY BIOPSY;  Surgeon: Fransisco Hertzhen, Brian L, MD;  [facility];  Service: Vascular;  Laterality: Bilateral;  . IR 3D INDEPENDENT WKST  06/02/2017  . IR ANGIO INTRA EXTRACRAN SEL COM CAROTID INNOMINATE BILAT MOD SED  06/02/2017  . IR ANGIO VERTEBRAL SEL VERTEBRAL BILAT MOD SED  06/02/2017  . SHOULDER SURGERY          Home Medications    Prior to Admission medications   Medication Sig Start Date End Date Taking? Authorizing Provider  acetaminophen (TYLENOL) 325 MG tablet Take 1,000 mg by mouth 3 (three) times daily as needed for mild pain.    Yes [provider]  acetaminophen-codeine (TYLENOL #3) 300-30 MG tablet Take 1 tablet by mouth every 8 (eight) hours as needed. Shoulder [pain   Yes [provider]  albuterol-ipratropium (COMBIVENT) 18-103 MCG/ACT inhaler Inhale 1 puff into the lungs every 4 (four) hours as needed for wheezing or shortness of breath.   Yes [provider]  aspirin EC 81 MG tablet Take 81 mg by mouth once a week. On Sunday   Yes [provider]  Cholecalciferol 50 MCG (2000 UT) TABS Take 1 tablet by mouth daily.   Yes [provider]  divalproex (DEPAKOTE ER) 500 MG 24 hr tablet Take 1 tablet (500 mg total) by mouth daily. Patient taking differently: Take 500 mg by mouth 2 (two) times daily.  12/23/17  Yes George HughVanschaick, Jessica, NP  EPINEPHrine 0.3 mg/0.3 mL IJ SOAJ injection Inject 0.3 mg into the muscle  daily as needed (anaphylaxis).    Yes [provider]  escitalopram (LEXAPRO) 10 MG tablet Take 10 mg by mouth daily.   Yes [provider]  fluticasone (FLONASE) 50 MCG/ACT nasal spray Place 2 sprays into both nostrils daily as needed for allergies. 12/08/17  Yes [provider]  lisinopril-hydrochlorothiazide (PRINZIDE,ZESTORETIC) 20-12.5 MG tablet Take 1 tablet by mouth daily.   Yes [provider]  mometasone (ASMANEX) 220 MCG/INH inhaler Inhale 2 puffs into the lungs 2 (two) times daily.   Yes  [provider]  Multiple Vitamin (MULTIVITAMIN) tablet Take 1 tablet by mouth daily.   Yes [provider]  Omega-3 Fatty Acids (FISH OIL) 1000 MG CPDR Take 1,000 tablets by mouth daily.   Yes [provider]  pantoprazole (PROTONIX) 40 MG tablet Take 1 tablet (40 mg total) by mouth daily. 06/03/17 06/03/18 Yes Standley Brooking, MD  pravastatin (PRAVACHOL) 80 MG tablet Take 80 mg by mouth daily. 12/13/17  Yes [provider]  REFRESH TEARS 0.5 % SOLN Place 1 drop into both eyes daily as needed for dry eyes. 04/12/18  Yes [provider]  verapamil (CALAN-SR) 120 MG CR tablet Take 120 mg by mouth 2 (two) times daily. 03/29/18  Yes [provider]    Family History Family History  Problem Relation Age of Onset  . Seizures Mother   . Heart failure Mother   . Stroke Brother   . Heart attack Brother     Social History Social History   Tobacco Use  . Smoking status: Former Smoker    Last attempt to quit: 01/21/1994    Years since quitting: 24.2  . Smokeless tobacco: Never Used  Substance Use Topics  . Alcohol use: No  . Drug use: No     Allergies   Eggs or egg-derived products; Acetic acid; Tea; and Other   Review of Systems Review of Systems  Unable to perform ROS: Acuity of condition     Physical Exam Updated Vital Signs  ED Triage Vitals  Enc Vitals Group     BP 04/16/18 0735 (!) 150/104     Pulse Rate 04/16/18 0735 81     Resp 04/16/18 0735 18     Temp --      Temp src --      SpO2 04/16/18 0735 99 %     Weight 04/16/18 0741 155 lb 6.8 oz (70.5 kg)     Height 04/16/18 0741 5\' 4"  (1.626 m)     Head Circumference --      Peak Flow --      Pain Score 04/16/18 0736 2     Pain Loc --      Pain Edu? --      Excl. in GC? --     Physical Exam Constitutional:      General: He is in acute distress.     Appearance: He is ill-appearing.  HENT:     Head: Normocephalic and atraumatic.     Nose: Nose normal.      Mouth/Throat:     Mouth: Mucous membranes are moist.  Eyes:     Extraocular Movements: Extraocular movements intact.     Conjunctiva/sclera: Conjunctivae normal.     Pupils: Pupils are equal, round, and reactive to light.     Comments: No obvious gaze preference  Neck:     Musculoskeletal: Normal range of motion and neck supple.  Cardiovascular:     Rate and Rhythm: Normal rate  and regular rhythm.     Pulses: Normal pulses.     Heart sounds: Normal heart sounds.  Pulmonary:     Effort: Pulmonary effort is normal. No respiratory distress.     Breath sounds: Normal breath sounds. No wheezing or rhonchi.  Abdominal:     General: Abdomen is flat. There is no distension.     Tenderness: There is no abdominal tenderness.  Skin:    Capillary Refill: Capillary refill takes less than 2 seconds.  Neurological:     Mental Status: He is alert.     GCS: GCS eye subscore is 4. GCS verbal subscore is 2. GCS motor subscore is 3.     Comments: Left-sided weakness on exam, appears to be able to move the right side without any issues, no obvious gaze preference but patient is unable to fully participate in exam  Psychiatric:        Mood and Affect: Mood normal.      ED Treatments / Results  Labs (all labs ordered are listed, but only abnormal results are displayed) Labs Reviewed  CBC - Abnormal; Notable for the following components:      Result Value   RBC 5.89 (*)    RDW 16.0 (*)    All other components within normal limits  DIFFERENTIAL - Abnormal; Notable for the following components:   Neutro Abs 8.0 (*)    Lymphs Abs 0.5 (*)    All other components within normal limits  COMPREHENSIVE METABOLIC PANEL - Abnormal; Notable for the following components:   Potassium 5.3 (*)    Glucose, Bld 119 (*)    Total Protein 8.6 (*)    Total Bilirubin 1.4 (*)    Anion gap 16 (*)    All other components within normal limits  CBG MONITORING, ED - Abnormal; Notable for the following components:    Glucose-Capillary 112 (*)    All other components within normal limits  POCT I-STAT 7, (LYTES, BLD GAS, ICA,H+H) - Abnormal; Notable for the following components:   pCO2 arterial 48.2 (*)    pO2, Arterial 372.0 (*)    Bicarbonate 28.3 (*)    Potassium 3.3 (*)    All other components within normal limits  PROTIME-INR  APTT  SODIUM  SODIUM  SODIUM  I-STAT TROPONIN, ED  I-STAT CREATININE, ED    EKG EKG Interpretation  Date/Time:  Sunday April 16 2018 10:21:10 EST Ventricular Rate:  79 PR Interval:    QRS Duration: 84 QT Interval:  352 QTC Calculation: 404 R Axis:   38 Text Interpretation:  Sinus rhythm Probable left atrial enlargement RSR' in V1 or V2, right VCD or RVH Abnrm T, consider ischemia, anterolateral lds Confirmed by Virgina NorfolkAdam, Rowynn Mcweeney [phone] on 04/16/2018 11:20:18 AM   Radiology Dg Chest Portable 1 View  Result Date: 04/16/2018 CLINICAL DATA:  ETT and OG tube placement EXAM: PORTABLE CHEST 1 VIEW COMPARISON:  04/16/2018 at 0839 hours FINDINGS: Endotracheal tube terminates 3 cm above the carina. Mild left basilar opacity, likely atelectasis. No frank interstitial edema. No pleural effusion or pneumothorax. The heart is normal in size. Right IJ venous catheter terminates at the cavoatrial junction. Enteric tube courses into the stomach. IMPRESSION: Endotracheal tube terminates 3 cm above the carina. Additional support apparatus as above. Electronically Signed   By: Charline BillsSriyesh  Krishnan M.D.   On: 04/16/2018 09:56   Dg Chest Portable 1 View  Result Date: 04/16/2018 CLINICAL DATA:  Central line placement EXAM: PORTABLE CHEST 1 VIEW COMPARISON:  02/08/2016 FINDINGS: Right jugular central venous catheter is in place with its tip at the cavoatrial junction. No pneumothorax. Normal heart size. Low lung volumes with bibasilar hypoaeration. Normal vascularity. No pleural effusion. IMPRESSION: Right jugular central venous catheter with its tip at the cavoatrial junction and no pneumothorax  Low lung volumes. Electronically Signed   By: Jolaine Click M.D.   On: 04/16/2018 09:00   Ct Head Code Stroke Wo Contrast  Result Date: 04/16/2018 CLINICAL DATA:  Code stroke. Confusion with generalized weakness. Suspected cerebral amyloid angiopathy. EXAM: CT HEAD WITHOUT CONTRAST TECHNIQUE: Contiguous axial images were obtained from the base of the skull through the vertex without intravenous contrast. COMPARISON:  Multiple prior CT and MR imaging studies. FINDINGS: Brain: Acute RIGHT posterior temporal, occipital, and parietal hemorrhage, dissecting throughout the cortex and subcortical white matter, early midline shift. Cross-sectional measurements of 52 x 82 x 53 mm corresponds to an approximate volume of 112 mL, larger than the previous hemorrhages. Early uncal herniation. Slight brainstem rotation. Midline shift at the septum pellucidum level RIGHT-to-LEFT of 8 mm. Encephalomalacia LEFT occipital lobe from prior hemorrhage. No hydrocephalus. Vascular: No hyperdense vessel or unexpected calcification. Skull: Normal. Negative for fracture or focal lesion. Sinuses/Orbits: No acute finding. Other: None. ASPECTS [facility] Stroke Program Early CT Score) Not applicable. IMPRESSION: 1. Large acute RIGHT hemisphere multilobar intracerebral hemorrhage, consistent with complication of cerebral amyloid angiopathy. Approximate volume 112 mL. Significant RIGHT-to-LEFT shift of 8 mm with early uncal herniation. 2. ASPECTS is not applicable. Critical Value/emergent results were called by telephone at the time of interpretation on 04/16/2018 at 7:25 am to Dr. Wilford Corner, who verbally acknowledged these results. Electronically Signed   By: Elsie Stain M.D.   On: 04/16/2018 07:42    Procedures .Critical Care Performed by: Virgina Norfolk, DO Authorized by: Virgina Norfolk, DO   Critical care provider statement:    Critical care time (minutes):  90   Critical care time was exclusive of:  Separately billable procedures and  treating other patients and teaching time   Critical care was necessary to treat or prevent imminent or life-threatening deterioration of the following conditions:  CNS failure or compromise   Critical care was time spent personally by me on the following activities:  Development of treatment plan with patient or surrogate, discussions with primary provider, evaluation of patient's response to treatment, examination of patient, obtaining history from patient or surrogate, ordering and performing treatments and interventions, ordering and review of laboratory studies, ordering and review of radiographic studies, re-evaluation of patient's condition, pulse oximetry, vascular access procedures and review of old charts   I assumed direction of critical care for this patient from another provider in my specialty: no   .Central Line Date/Time: 04/16/2018 10:05 AM Performed by: Virgina Norfolk, DO Authorized by: Virgina Norfolk, DO   Consent:    Consent obtained:  Emergent situation Pre-procedure details:    Hand hygiene: Hand hygiene performed prior to insertion     Sterile barrier technique: All elements of maximal sterile technique followed     Skin preparation:  2% chlorhexidine   Skin preparation agent: Skin preparation agent completely dried prior to procedure   Sedation:    Sedation type:  Anxiolysis Anesthesia (see MAR for exact dosages):    Anesthesia method:  Local infiltration   Local anesthetic:  Lidocaine 1% w/o epi Procedure details:    Location:  R internal jugular   Patient position:  Reverse Trendelenburg   Procedural supplies:  Triple lumen  Catheter size:  8 Fr   Landmarks identified: yes     Ultrasound guidance: yes     Sterile ultrasound techniques: Sterile gel and sterile probe covers were used     Number of attempts:  1   Successful placement: yes   Post-procedure details:    Post-procedure:  Dressing applied and line sutured   Assessment:  Blood return through all  ports, free fluid flow, no pneumothorax on x-ray and placement verified by x-ray   Patient tolerance of procedure:  Tolerated well, no immediate complications Procedure Name: Intubation Date/Time: 04/16/2018 10:06 AM Performed by: Virgina Norfolk, DO Pre-anesthesia Checklist: Patient identified, Patient being monitored, Emergency Drugs available, Timeout performed and Suction available Oxygen Delivery Method: Non-rebreather mask Preoxygenation: Pre-oxygenation with 100% oxygen Induction Type: Rapid sequence Ventilation: Mask ventilation without difficulty Laryngoscope Size: Glidescope and 4 Grade View: Grade I Tube size: 7.5 mm Number of attempts: 1 Airway Equipment and Method: Video-laryngoscopy and Rigid stylet Placement Confirmation: ETT inserted through vocal cords under direct vision,  CO2 detector and Breath sounds checked- equal and bilateral Secured at: 21 cm Tube secured with: ETT holder Difficulty Due To: Difficulty was unanticipated Future Recommendations: Recommend- induction with short-acting agent, and alternative techniques readily available      (including critical care time)  Medications Ordered in ED Medications  sodium chloride flush (NS) 0.9 % injection 3 mL (has no administration in time range)  clevidipine (CLEVIPREX) infusion 0.5 mg/mL (16 mg/hr Intravenous Rate/Dose Change 04/16/18 0951)   stroke: mapping our early stages of recovery book (has no administration in time range)  acetaminophen (TYLENOL) tablet 650 mg (has no administration in time range)    Or  acetaminophen (TYLENOL) solution 650 mg (has no administration in time range)    Or  acetaminophen (TYLENOL) suppository 650 mg (has no administration in time range)  senna-docusate (Senokot-S) tablet 1 tablet (has no administration in time range)  pantoprazole (PROTONIX) injection 40 mg (has no administration in time range)  labetalol (NORMODYNE,TRANDATE) injection 20 mg (has no administration in time  range)    And  clevidipine (CLEVIPREX) infusion 0.5 mg/mL (2 mg/hr Intravenous Canceled Entry 04/16/18 0756)  sodium chloride (hypertonic) 3 % solution (75 mL/hr Intravenous New Bag/Given 04/16/18 0859)  fentaNYL (SUBLIMAZE) 100 MCG/2ML injection (has no administration in time range)  propofol (DIPRIVAN) 1000 MG/100ML infusion (32.12 mcg/kg/min  70.5 kg Intravenous Rate/Dose Change 04/16/18 1003)  succinylcholine (ANECTINE) injection 100 mg (has no administration in time range)  midazolam (VERSED) injection 2 mg (has no administration in time range)  fentaNYL (SUBLIMAZE) injection 50 mcg (50 mcg Intravenous Given 04/16/18 1113)  fentaNYL (SUBLIMAZE) injection 100 mcg (has no administration in time range)  ondansetron (ZOFRAN) injection 4 mg (4 mg Intravenous Given 04/16/18 0815)  LORazepam (ATIVAN) 2 MG/ML injection (2 mg  Given 04/16/18 0807)  sodium chloride 23.4 % (4 mEq/mL) IV in VIAFLEX BAG (0 mEq Intravenous Stopped 04/16/18 1100)  succinylcholine (ANECTINE) injection (100 mg Intravenous Given 04/16/18 0920)     Initial Impression / Assessment and Plan / ED Course  I have reviewed the triage vital signs and the nursing notes.  Pertinent labs & imaging results that were available during my care of the patient were reviewed by me and considered in my medical decision making (see chart for details).     Luis Johnson is a 63 year old male with history of hypertension, stroke, amyloidosis who presents to the ED as a code stroke.  Patient last well-known about 12 hours ago.  Found by family with left-sided weakness, aphasia.  Patient appears to have left-sided weakness, no obvious gaze preference, aphasia, airway intact.  Patient went directly to the CT scan for evaluation with neurology.  CT scan showed cerebral hemorrhage with early uncal herniation with 8 mm of shift.  Patient is not on any blood thinners.  Dr. Jerrell Belfast with neurology would like to start 3% normal saline.  Patient is not a surgical  candidate.  Central line was placed and fentanyl and Ativan were used separately to help with patient agitation in order to cooperate with procedure.  Lab work showed no significant anemia, electrolyte abnormality, kidney injury.  Troponin normal.  No kidney injury.    Central line was confirmed with chest x-ray that showed appropriate placement.  There is no pneumothorax, no pneumonia.  Patient remains somnolent after central line placement and due to concern for worsening central process patient was intubated for airway protection.  Could possibly be due to pain medications however after about 30-45 minutes of patient not improving mentally, was successfully intubated.  Repeat chest x-ray showed successful placement and position of ET tube.  OG and Foley were placed.  Dr. Jerrell Belfast with neurology was made aware of the change in the mental status of the patient and he states that he will repeat head CT at 130 and no need to repeat CT scan at this time.   We will continue 3% normal saline and titration of IV clevidipine for blood pressure goal of less than 110 systolic.  Patient was started also on a propofol drip with intermittent fentanyl and versed for sedation.  Patient remained hemodynamically stable throughout my care.  Was transferred to the neurological ICU in critical condition.  Family was made aware of updates and critical condition of patient.   This chart was dictated using voice recognition software.  Despite best efforts to proofread,  errors can occur which can change the documentation meaning.    Final Clinical Impressions(s) / ED Diagnoses   Final diagnoses:  Nontraumatic intracerebral hemorrhage, unspecified cerebral location, unspecified laterality [facility])    ED Discharge Orders    None       Virgina Norfolk, DO 04/16/18 1013    Caressa Scearce, DO 04/16/18 1120

## 2018-04-16 NOTE — ED Triage Notes (Signed)
Puplie check  At same time BP 127SBP

## 2018-04-16 NOTE — H&P (Addendum)
H&P  CC: Left-sided weakness  History is obtained from: EMS  HPI: Luis Johnson is a 63 y.o. male past medical history of prior ICH, amyloid beta related angiitis and inflammation, with past bleed in left hemisphere, hypertension, convexity subarachnoid and nonaneurysmal bleed in the past, presenting today to the ER after EMS was called at home to become for decreased responsiveness.  According to his wife, he was complaining of a headache since 4:30 PM on 04/15/2018 and around 5 PM went and laid on the floor and would not get up from the floor.  The wife got concerned this morning and called EMS.  He was noted to be flaccid on the left side on EMS evaluation.  He also had an LVO screen that was 5 on the fast ED scale.  He was brought in as a acute code stroke.  On initial examination, he was able to follow some commands but did not open eyes to voice or noxious stimulation, and detailed exam below.  He was a GCS of 11. Noncontrast CT of the head reveals a large parenchymal intracerebral hemorrhage in the right parietotemporal area. He is not able to provide reasonable history.   LKW: 4:30 PM on 04/15/2018 tpa given?: no, ICH Premorbid modified Rankin scale (mRS): 2  ROS: ROS was performed and is negative except as noted in the HPI.   Past Medical History:  Diagnosis Date  . Hypertension   . Stroke [facility](HCC)    Family History  Problem Relation Age of Onset  . Seizures Mother   . Heart failure Mother   . Stroke Brother   . Heart attack Brother    Social History:   reports that he quit smoking about 24 years ago. He has never used smokeless tobacco. He reports that he does not drink alcohol or use drugs.  Medications  Current Facility-Administered Medications:  .  sodium chloride flush (NS) 0.9 % injection 3 mL, 3 mL, Intravenous, Once, Curatolo, Adam, DO  Current Outpatient Medications:  .  acetaminophen (TYLENOL) 325 MG tablet, Take 1,000 mg by mouth 3 (three) times daily as needed for  mild pain. , Disp: , Rfl:  .  acetaminophen-codeine (TYLENOL #3) 300-30 MG tablet, Take 1 tablet by mouth every 8 (eight) hours as needed. Shoulder [pain, Disp: , Rfl:  .  albuterol-ipratropium (COMBIVENT) 18-103 MCG/ACT inhaler, Inhale 1 puff into the lungs every 4 (four) hours as needed for wheezing or shortness of breath., Disp: , Rfl:  .  amLODipine (NORVASC) 10 MG tablet, Take 10 mg by mouth daily., Disp: , Rfl:  .  cephALEXin (KEFLEX) 500 MG capsule, Take 500 mg by mouth every 6 (six) hours., Disp: , Rfl:  .  divalproex (DEPAKOTE ER) 500 MG 24 hr tablet, Take 1 tablet (500 mg total) by mouth daily., Disp: 30 tablet, Rfl: 7 .  EPINEPHrine 0.3 mg/0.3 mL IJ SOAJ injection, Inject 0.3 mg into the muscle daily as needed (anaphylaxis). , Disp: , Rfl:  .  escitalopram (LEXAPRO) 10 MG tablet, Take 10 mg by mouth daily., Disp: , Rfl:  .  lisinopril (PRINIVIL,ZESTRIL) 5 MG tablet, Take 5 mg by mouth daily., Disp: , Rfl:  .  Multiple Vitamin (MULTIVITAMIN) tablet, Take 1 tablet by mouth daily., Disp: , Rfl:  .  pantoprazole (PROTONIX) 40 MG tablet, Take 1 tablet (40 mg total) by mouth daily., Disp: 30 tablet, Rfl: 1 .  pravastatin (PRAVACHOL) 80 MG tablet, Take 80 mg by mouth daily., Disp: , Rfl:  .  QVAR 80 MCG/ACT inhaler, INHALE 2 PUFFS TWICE DAILY, Disp: , Rfl: 0  Exam: Current vital signs: There were no vitals taken for this visit.  Systolic blood pressure 156/84, heart rate 84 Vital signs in last 24 hours:   General: Patient is drowsy, does not open eyes to voice but nods head in response to some simple commands. HEENT: Normocephalic atraumatic Neck: Clear to auscultation Vascular: S1 is heard regular rhythm Abdomen: Soft nondistended nontender Extremities warm well perfused Neurological exam He is drowsy, does not open eyes to voice. Follows some commands on the right inconsistently. Speech is mildly dysarthric. Unable to test for naming comprehension repetition and fluency due to  drowsiness. Cranials: Pupils equal round react light, extraocular movements intact, visual fields full, left nasolabial fold flattening, visual field cut on the left.  Auditory gait intact. Motor exam: Flaccid left upper and minimal response to noxious elation on the left lower extremity.  Right upper and lower extremity full-strength. Sensory exam: Decreased sensation to noxious simulation on the left compared to the right. Coordination cannot be tested Gait cannot be tested NIH stroke scale 1a Level of Conscious.: 1 1b LOC Questions: 1 1c LOC Commands: 0 2 Best Gaze: 0 3 Visual: 2 4 Facial Palsy: 1 5a Motor Arm - left: 4 5b Motor Arm - Right: 0 6a Motor Leg - Left: 4 6b Motor Leg - Right: 0 7 Limb Ataxia: 0 8 Sensory: 1 9 Best Language: 1 10 Dysarthria: 1 11 Extinct. and Inatten.: 1 TOTAL: 18  ICH score-2  Labs I have reviewed labs in epic and the results pertinent to this consultation are: CBC    Component Value Date/Time   WBC 4.0 12/21/2017 1644   RBC 5.72 12/21/2017 1644   HGB 17.0 12/21/2017 1652   HCT 50.0 12/21/2017 1652   PLT 348 12/21/2017 1644   MCV 82.3 12/21/2017 1644   MCH 26.2 12/21/2017 1644   MCHC 31.8 12/21/2017 1644   RDW 15.0 12/21/2017 1644   LYMPHSABS 0.9 12/21/2017 1644   MONOABS 0.3 12/21/2017 1644   EOSABS 0.1 12/21/2017 1644   BASOSABS 0.0 12/21/2017 1644   CMP     Component Value Date/Time   NA 137 12/21/2017 1652   K 6.1 (H) 12/21/2017 1652   CL 103 12/21/2017 1652   CO2 26 12/21/2017 1644   GLUCOSE 87 12/21/2017 1652   BUN 17 12/21/2017 1652   CREATININE 1.00 12/21/2017 1652   CALCIUM 9.6 12/21/2017 1644   PROT 7.5 12/21/2017 1644   ALBUMIN 3.8 12/21/2017 1644   AST 36 12/21/2017 1644   ALT 22 12/21/2017 1644   ALKPHOS 58 12/21/2017 1644   BILITOT 1.3 (H) 12/21/2017 1644   GFRNONAA >60 12/21/2017 1644   GFRAA >60 12/21/2017 1644   Lipid Panel     Component Value Date/Time   CHOL 169 06/01/2017 0551   TRIG 94 06/01/2017  0551   HDL 47 06/01/2017 0551   CHOLHDL 3.6 06/01/2017 0551   VLDL 19 06/01/2017 0551   LDLCALC 103 (H) 06/01/2017 0551   Imaging I have reviewed the images obtained: CT-scan of the brain - large >100 cc bleed in left parietotemporal region. 81mm MLS.  Assessment:  63 year old man past history of ICH, amyloid angiopathy, presenting with decreased level of consciousness since 4:30 PM yesterday and noted to have a large right parietotemporal lobar ICH likely secondary to his amyloid angiopathy. Midline shift 6 mm  Impression: ICH  Assessment:   Plan: Subcortical ICH, nontraumatic Cortical ICH, nontraumatic  Acuity: Acute Laterality: Right Current suspected etiology: Amyloid angiopathy Treatment: -Admit to neurological ICU -ICH Score: 2 -ICH Volume: More than 100 cc -BP control goal SYS<140 -Repeat CT in 6 hours.  Consider neurosurgical consultation for evacuation if bleed size increases -PT/OT/ST  -neuromonitoring  CNS Cerebral edema Compression of brain -Hyperosmolar therapy-3% saline at 75 cc an hour peripherally.  Will request ER for central line placement.  Most recent sodium 138.  Can do 23.4 saline bolus. -Sodium checks-goal sodium 150-155 -Close neuro monitoring  Watch for hydrocephalus.  Consult neurosurgery if needed.  I have curb sided and reviewed images with Dr. Dutch Quint who recommends no surgical intervention for now.  Please consult formally if deemed appropriate.  Dysarthria Dysphagia following ICH  -NPO until cleared by speech -ST -Advance diet as tolerated -May need PEG  Hemiplegia and hemiparesis following nontraumatic intracerebral hemorrhage affecting left non-dominant side  -Continue PT/OT/ST  RESP Might need to be intubated- for now protecting airway Monitor closely.  Possible aspiration pneumonia- chest x-ray, no antibiotics for now.  CV Hypertensive Emergency -Aggressive BP control, goal SBP < 140 -Cleviprex labetalol  GI/GU -Gentle  hydration -avoid nephrotoxic agents  HEME -Monitor -transfuse for hgb < 7  ENDO -goal HgbA1c < 7  Fluid/Electrolyte Disorders -Replete -Repeat labs  ID Possible Aspiration PNA -CXR -NPO -Monitor  Prophylaxis DVT: scd  GI: pantoprazole Bowel: doc/senna  Dispo: SNF   Diet: NPO until cleared by speech  Code Status: Full Code     THE FOLLOWING WERE PRESENT ON ADMISSION: ICH Hemiplegia Cerebral edema Possible aspiration pneumonia.  -- Milon Dikes, MD [facility] Pager: [phone] If 7pm to 7am, please call on call as listed on AMION.    CRITICAL CARE ATTESTATION Performed by: Milon Dikes, MD Total critical care time: 45 minutes Critical care time was exclusive of separately billable procedures and treating other patients and/or supervising APPs/Residents/Students Critical care was necessary to treat or prevent imminent or life-threatening deterioration due to ICH  This patient is critically ill and at significant risk for neurological worsening and/or death and care requires constant monitoring. Critical care was time spent personally by me on the following activities: development of treatment plan with patient and/or surrogate as well as nursing, discussions with consultants, evaluation of patient's response to treatment, examination of patient, obtaining history from patient or surrogate, ordering and performing treatments and interventions, ordering and review of laboratory studies, ordering and review of radiographic studies, pulse oximetry, re-evaluation of patient's condition, participation in multidisciplinary rounds and medical decision making of high complexity in the care of this patient.

## 2018-04-16 NOTE — Procedures (Signed)
Intubation Procedure Note Luis Johnson [id number] 04-Aug-1955  Procedure: Intubation Indications: Respiratory insufficiency  Procedure Details Consent: Unable to obtain consent because of altered level of consciousness. Time Out: Verified patient identification, verified procedure, site/side was marked, verified correct patient position, special equipment/implants available, medications/allergies/relevent history reviewed, required imaging and test results available.  Performed  Maximum sterile technique was used including hand hygiene.  Miller    Evaluation Hemodynamic Status: BP stable throughout; O2 sats: stable throughout Patient's Current Condition: stable Complications: No apparent complications Patient did tolerate procedure well. Chest X-ray ordered to verify placement.  CXR: tube position acceptable  .   Luis Johnson 04/16/2018

## 2018-04-16 NOTE — ED Notes (Signed)
Central line insertion successful by Dr. Lockie Mola. Patient difficult to arouse d/t fentanyl administration. Patient responds to painful stimuli and pulses present in all extremities. Dr. Lockie Mola aware.

## 2018-04-16 NOTE — ED Triage Notes (Signed)
REport by EMS  ,Family reported Pt LSN at 1700 Sat . PT on floor and would not get and would not eat. Pt on floor all night long. Pt past HX of stroke. Pt respones to  Painful stimuli

## 2018-04-17 ENCOUNTER — Inpatient Hospital Stay (HOSPITAL_COMMUNITY): Payer: Medicare Other

## 2018-04-17 ENCOUNTER — Encounter (HOSPITAL_COMMUNITY): Payer: Self-pay | Admitting: Nurse Practitioner

## 2018-04-17 DIAGNOSIS — I611 Nontraumatic intracerebral hemorrhage in hemisphere, cortical: Secondary | ICD-10-CM

## 2018-04-17 DIAGNOSIS — J9601 Acute respiratory failure with hypoxia: Secondary | ICD-10-CM

## 2018-04-17 DIAGNOSIS — J9690 Respiratory failure, unspecified, unspecified whether with hypoxia or hypercapnia: Secondary | ICD-10-CM | POA: Diagnosis not present

## 2018-04-17 DIAGNOSIS — I639 Cerebral infarction, unspecified: Secondary | ICD-10-CM

## 2018-04-17 LAB — SODIUM
SODIUM: 147 mmol/L — AB (ref 135–145)
SODIUM: 150 mmol/L — AB (ref 135–145)
Sodium: 145 mmol/L (ref 135–145)
Sodium: 148 mmol/L — ABNORMAL HIGH (ref 135–145)
Sodium: 150 mmol/L — ABNORMAL HIGH (ref 135–145)
Sodium: 147 mmol/L — ABNORMAL HIGH (ref 135–145)

## 2018-04-17 LAB — ECHOCARDIOGRAM COMPLETE
Height: 64 in
Weight: 2486.79 oz

## 2018-04-17 LAB — GLUCOSE, CAPILLARY
Glucose-Capillary: 121 mg/dL — ABNORMAL HIGH (ref 70–99)
Glucose-Capillary: 110 mg/dL — ABNORMAL HIGH (ref 70–99)
Glucose-Capillary: 118 mg/dL — ABNORMAL HIGH (ref 70–99)
Glucose-Capillary: 139 mg/dL — ABNORMAL HIGH (ref 70–99)

## 2018-04-17 LAB — PHOSPHORUS: Phosphorus: 2.5 mg/dL (ref 2.5–4.6)

## 2018-04-17 LAB — MAGNESIUM: Magnesium: 2.5 mg/dL — ABNORMAL HIGH (ref 1.7–2.4)

## 2018-04-17 MED ORDER — ADULT MULTIVITAMIN W/MINERALS CH
1.0000 | ORAL_TABLET | Freq: Every day | ORAL | Status: DC
  Administered 2018-04-18 – 2018-04-24 (×7): 1
  Filled 2018-04-17 (×8): qty 1

## 2018-04-17 MED ORDER — PRO-STAT SUGAR FREE PO LIQD
60.0000 mL | Freq: Two times a day (BID) | ORAL | Status: DC
  Administered 2018-04-17 – 2018-04-19 (×4): 60 mL
  Filled 2018-04-17 (×4): qty 60

## 2018-04-17 MED ORDER — INSULIN ASPART 100 UNIT/ML ~~LOC~~ SOLN
0.0000 [IU] | SUBCUTANEOUS | Status: DC
  Administered 2018-04-17 – 2018-04-18 (×3): 2 [IU] via SUBCUTANEOUS
  Administered 2018-04-18: 3 [IU] via SUBCUTANEOUS
  Administered 2018-04-18 – 2018-04-19 (×4): 2 [IU] via SUBCUTANEOUS
  Administered 2018-04-20: 3 [IU] via SUBCUTANEOUS
  Administered 2018-04-20 – 2018-04-21 (×7): 2 [IU] via SUBCUTANEOUS
  Administered 2018-04-22: 5 [IU] via SUBCUTANEOUS
  Administered 2018-04-22: 3 [IU] via SUBCUTANEOUS
  Administered 2018-04-22: 2 [IU] via SUBCUTANEOUS
  Administered 2018-04-22 – 2018-04-23 (×5): 3 [IU] via SUBCUTANEOUS
  Administered 2018-04-23: 2 [IU] via SUBCUTANEOUS
  Administered 2018-04-23 – 2018-04-25 (×8): 3 [IU] via SUBCUTANEOUS
  Administered 2018-04-26 – 2018-04-27 (×5): 2 [IU] via SUBCUTANEOUS

## 2018-04-17 MED ORDER — VITAL HIGH PROTEIN PO LIQD
1000.0000 mL | ORAL | Status: DC
  Administered 2018-04-18 – 2018-04-19 (×2): 1000 mL
  Filled 2018-04-17 (×3): qty 1000

## 2018-04-17 MED ORDER — SODIUM CHLORIDE 3 % IV SOLN
INTRAVENOUS | Status: DC
  Administered 2018-04-17 (×2): 75 mL/h via INTRAVENOUS
  Administered 2018-04-18 (×3): 80 mL/h via INTRAVENOUS
  Filled 2018-04-17 (×11): qty 500

## 2018-04-17 NOTE — Progress Notes (Signed)
  Echocardiogram 2D Echocardiogram has been performed.  Luis Johnson 04/17/2018, 3:03 PM

## 2018-04-17 NOTE — Consult Note (Signed)
NAME:  Luis Johnson, MRN:  [id number], DOB:  03/02/1956, LOS: 1 ADMISSION DATE:  04/16/2018, CONSULTATION DATE:  04/17/18 REFERRING MD:  Pearlean BrownieSethi  CHIEF COMPLAINT:  AMS   Brief History   Luis Johnson is a 63 y.o. male who was admitted 2/2 with ICH and required intubation for airway protection.  History of present illness   Pt is encephelopathic; therefore, this HPI is obtained from chart review. Luis Johnson is a 63 y.o. male who has a PMH as outlined below (see "past medical history").  [facility] with AMS.  He apparently had a headache from the night prior and went to lie down on the floor.  He would not get up; therefore, wife got concerned 2/2 and called EMS.  On EMS arrival, he was flaccid on the left.  [facility] in [facility] right parietotemporal area.  He was intubated for airway protection and was admitted by neurosurgery. PCCM asked to see 2/3 for assistance with vent management.  Past Medical History  ICH, SAH, HTN, amyloid angiopathy.  Significant Hospital Events   2/2 > admit.  Consults:  PCCM.  Procedures:  ETT 2/2 >  R IJ CVL 2/2 >   Significant Diagnostic Tests:  CT head 2/2 > large posterior right hemisphere intra axial hemorrhage.  Stable associated mass effect with 7mm L to R MLS. MRI brain 2/3 >  Echo 2/3 >   Micro Data:  None.  Antimicrobials:  None.   Interim history/subjective:  Sedated, comfortable.  Objective:  Blood pressure 140/86, pulse 99, temperature 99.3 F (37.4 C), resp. rate 19, height 5\' 4"  (1.626 m), weight 70.5 kg, SpO2 100 %.    Vent Mode: CPAP;PSV FiO2 (%):  [40 %-60 %] 40 % Set Rate:  [16 bmp] 16 bmp Vt Set:  [470 mL] 470 mL PEEP:  [5 cmH20] 5 cmH20 Pressure Support:  [5 cmH20] 5 cmH20 Plateau Pressure:  [11 cmH20-16 cmH20] 11 cmH20   Intake/Output Summary (Last 24 hours) at 04/17/2018 1142 Last data filed at 04/17/2018 1000 Gross per 24 hour  Intake 2677.14 ml  Output 990  ml  Net 1687.14 ml   Filed Weights   04/16/18 0741  Weight: 70.5 kg    Examination: General: Adult male, in NAD. Neuro: Sedated, non responsive. HEENT: [place]/AT. Sclerae anicteric.  ETT in place. Cardiovascular: RRR, no M/R/G.  Lungs: Respirations even and unlabored.  CTA bilaterally, No W/R/R. Abdomen: BS x 4, soft, NT/ND.  Musculoskeletal: No gross deformities, no edema.  Skin: Intact, warm, no rashes.  Assessment & Plan:   Large right IPH. - Stroke workup / management per neuro. - Continue 3% NS per neuro. - NSGY consult if worsens.  Respiratory insufficiency - due to above. - Full vent support. - No weaning unless mental status improves. - Bronchial hygiene. - Follow CXR.  Hx HTN now with Hypertensive emergency. - Continue cleviprex per neuro, goal SBP < 140. - Continue labetalol PRN. - Hold preadmission ASA, prinzide, verapamil.   Best Practice:  Diet: NPO.  Start TF's. Pain/Anxiety/Delirium protocol (if indicated): Propofol gtt / Fetanyl PRN.  RASS goal 0 to -1. VAP protocol (if indicated): In place. DVT prophylaxis: SCD's. GI prophylaxis: PPI. Glucose control: SSI. Mobility: Bedrest. Code Status: Full.  Family Communication: None available. Disposition: ICU.  Labs   CBC: Recent Labs  Lab 04/16/18 0739 04/16/18 1053  WBC 9.1  --   NEUTROABS 8.0*  --   HGB  15.3 15.3  HCT 50.0 45.0  MCV 84.9  --   PLT 167  --    Basic Metabolic Panel: Recent Labs  Lab 04/16/18 0739 04/16/18 0743 04/16/18 1053 04/16/18 1424 04/16/18 1913 04/17/18 0150 04/17/18 0742  NA 138  138  --  138 142 139 145 148*  K 5.3*  --  3.3*  --   --   --   --   CL 100  --   --   --   --   --   --   CO2 22  --   --   --   --   --   --   GLUCOSE 119*  --   --   --   --   --   --   BUN 17  --   --   --   --   --   --   CREATININE 1.06 0.90  --   --   --   --   --   CALCIUM 9.7  --   --   --   --   --   --    GFR: Estimated Creatinine Clearance: 69.4 mL/min (by C-G formula  based on SCr of 0.9 mg/dL). Recent Labs  Lab 04/16/18 0739  WBC 9.1   Liver Function Tests: Recent Labs  Lab 04/16/18 0739  AST 27  ALT 14  ALKPHOS 44  BILITOT 1.4*  PROT 8.6*  ALBUMIN 4.1   No results for input(s): LIPASE, AMYLASE in the last 168 hours. No results for input(s): AMMONIA in the last 168 hours. ABG    Component Value Date/Time   PHART 7.376 04/16/2018 1053   PCO2ART 48.2 (H) 04/16/2018 1053   PO2ART 372.0 (H) 04/16/2018 1053   HCO3 28.3 (H) 04/16/2018 1053   TCO2 30 04/16/2018 1053   O2SAT 100.0 04/16/2018 1053    Coagulation Profile: Recent Labs  Lab 04/16/18 0739  INR 0.99   Cardiac Enzymes: No results for input(s): CKTOTAL, CKMB, CKMBINDEX, TROPONINI in the last 168 hours. HbA1C: Hgb A1c MFr Bld  Date/Time Value Ref Range Status  06/01/2017 05:51 AM 5.5 4.8 - 5.6 % Final    Comment:    (NOTE) Pre diabetes:          5.7%-6.4% Diabetes:              >6.4% Glycemic control for   <7.0% adults with diabetes   11/12/2014 09:25 AM 6.1 (H) 4.8 - 5.6 % Final    Comment:    (NOTE)         Pre-diabetes: 5.7 - 6.4         Diabetes: >6.4         Glycemic control for adults with diabetes: <7.0    CBG: Recent Labs  Lab 04/16/18 0734  GLUCAP 112*    Review of Systems:   Unable to obtain as pt is encephalopathic.  Past medical history  He,  has a past medical history of Hypertension and Stroke (HCC).   Surgical History    Past Surgical History:  Procedure Laterality Date  . ARTERY BIOPSY Bilateral 06/03/2017   Procedure: BILATERAL TEMPORAL ARTERY BIOPSY;  Surgeon: Fransisco Hertz, MD;  [facility];  Service: Vascular;  Laterality: Bilateral;  . IR 3D INDEPENDENT WKST  06/02/2017  . IR ANGIO INTRA EXTRACRAN SEL COM CAROTID INNOMINATE BILAT MOD SED  06/02/2017  . IR ANGIO VERTEBRAL SEL VERTEBRAL BILAT MOD SED  06/02/2017  .  SHOULDER SURGERY       Social History   reports that he quit smoking about 24 years ago. He has never used smokeless  tobacco. He reports that he does not drink alcohol or use drugs.   Family history   His family history includes Heart attack in his brother; Heart failure in his mother; Seizures in his mother; Stroke in his brother.   Allergies Allergies  Allergen Reactions  . Eggs Or Egg-Derived Products Anaphylaxis and Swelling    Throat and body swells (can only tolerated boiled eggs)  . Acetic Acid Other (See Comments)    Pt had initial headache and believes it led to brain bleeds and blindness  . 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 Other Other (See Comments)    Headaches lead to blindness oftentimes (starts with eyes fluttering, then progresses to stuttering)     Home meds  Prior to Admission medications   Medication Sig Start Date End Date Taking? Authorizing Provider  acetaminophen (TYLENOL) 325 MG tablet Take 1,000 mg by mouth 3 (three) times daily as needed for mild pain.    Yes [provider]  acetaminophen-codeine (TYLENOL #3) 300-30 MG tablet Take 1 tablet by mouth every 8 (eight) hours as needed. Shoulder [pain   Yes [provider]  albuterol-ipratropium (COMBIVENT) 18-103 MCG/ACT inhaler Inhale 1 puff into the lungs every 4 (four) hours as needed for wheezing or shortness of breath.   Yes [provider]  aspirin EC 81 MG tablet Take 81 mg by mouth once a week. On Sunday   Yes [provider]  Cholecalciferol 50 MCG (2000 UT) TABS Take 1 tablet by mouth daily.   Yes [provider]  divalproex (DEPAKOTE ER) 500 MG 24 hr tablet Take 1 tablet (500 mg total) by mouth daily. Patient taking differently: Take 500 mg by mouth 2 (two) times daily.  12/23/17  Yes George Hugh, NP  EPINEPHrine 0.3 mg/0.3 mL IJ SOAJ injection Inject 0.3 mg into the muscle daily as needed (anaphylaxis).    Yes [provider]  escitalopram (LEXAPRO) 10 MG tablet Take 10 mg by mouth daily.   Yes [provider]  fluticasone (FLONASE) 50 MCG/ACT nasal spray Place 2 sprays into both nostrils daily as needed for allergies. 12/08/17  Yes [provider]  lisinopril-hydrochlorothiazide (PRINZIDE,ZESTORETIC) 20-12.5 MG tablet Take 1 tablet by mouth daily.   Yes [provider]  mometasone (ASMANEX) 220 MCG/INH inhaler Inhale 2 puffs into the lungs 2 (two) times daily.   Yes [provider]  Multiple Vitamin (MULTIVITAMIN) tablet Take 1 tablet by mouth daily.   Yes [provider]  Omega-3 Fatty Acids (FISH OIL) 1000 MG CPDR Take 1,000 tablets by mouth daily.   Yes [provider]  pantoprazole (PROTONIX) 40 MG tablet Take 1 tablet (40 mg total) by mouth daily. 06/03/17 06/03/18 Yes Standley Brooking, MD  pravastatin (PRAVACHOL) 80 MG tablet Take 80 mg by mouth daily. 12/13/17  Yes [provider]  REFRESH TEARS 0.5 % SOLN Place 1 drop into both eyes daily as needed for dry eyes. 04/12/18  Yes [provider]  verapamil (CALAN-SR) 120 MG CR tablet Take 120 mg by mouth 2 (two) times daily. 03/29/18  Yes [provider]    Critical care time: 35 min.    Rutherford Guys, PA - C [facility] Pager: [phone] -  [phone].  If no answer, [phone] 04/17/2018, 11:42 AM

## 2018-04-17 NOTE — Progress Notes (Signed)
OT Cancellation Note  Patient Details Name: Luis Johnson MRN: [id number] DOB: [birth year]   Cancelled Treatment:    Reason Eval/Treat Not Completed: Active bedrest order. Intubated, sedated and not medically ready per RN.  Will follow and initiate OT eval when medically appropriate and able.   Chancy Milroy, OT Acute Rehabilitation Services Pager [phone] Office [phone]   Chancy Milroy 04/17/2018, 9:12 AM

## 2018-04-17 NOTE — Progress Notes (Addendum)
STROKE TEAM PROGRESS NOTE   INTERVAL HISTORY His wife is at the bedside.  Dr. Pearlean Brownie discussed with her severity of stroke and likely poor long-term prognosis.  Wife wants all aggressive measures and full care.  Vitals:   04/17/18 0811 04/17/18 0830 04/17/18 0900 04/17/18 0930  BP: 140/84 139/85 137/87 138/84  Pulse: 96 100 (!) 101 (!) 109  Resp: 17 17 18 18   Temp:  99.5 F (37.5 C) 99.7 F (37.6 C) 99.7 F (37.6 C)  TempSrc:  Other (Comment)    SpO2: 100% 100% 100% 100%  Weight:      Height:        CBC:  Recent Labs  Lab 04/16/18 0739 04/16/18 1053  WBC 9.1  --   NEUTROABS 8.0*  --   HGB 15.3 15.3  HCT 50.0 45.0  MCV 84.9  --   PLT 167  --     Basic Metabolic Panel:  Recent Labs  Lab 04/16/18 0739 04/16/18 0743 04/16/18 1053  04/17/18 0150 04/17/18 0742  NA 138  138  --  138   < > 145 148*  K 5.3*  --  3.3*  --   --   --   CL 100  --   --   --   --   --   CO2 22  --   --   --   --   --   GLUCOSE 119*  --   --   --   --   --   BUN 17  --   --   --   --   --   CREATININE 1.06 0.90  --   --   --   --   CALCIUM 9.7  --   --   --   --   --    < > = values in this interval not displayed.   Lipid Panel:     Component Value Date/Time   CHOL 169 06/01/2017 0551   TRIG 94 06/01/2017 0551   HDL 47 06/01/2017 0551   CHOLHDL 3.6 06/01/2017 0551   VLDL 19 06/01/2017 0551   LDLCALC 103 (H) 06/01/2017 0551   HgbA1c:  Lab Results  Component Value Date   HGBA1C 5.5 06/01/2017   Urine Drug Screen:     Component Value Date/Time   LABOPIA NONE DETECTED 12/21/2017 2014   COCAINSCRNUR NONE DETECTED 12/21/2017 2014   LABBENZ NONE DETECTED 12/21/2017 2014   AMPHETMU NONE DETECTED 12/21/2017 2014   THCU NONE DETECTED 12/21/2017 2014   LABBARB NONE DETECTED 12/21/2017 2014    Alcohol Level     Component Value Date/Time   ETH <10 12/21/2017 1644    IMAGING Ct Head Wo Contrast  Result Date: 04/16/2018 CLINICAL DATA:  63 year old male with acute right hemisphere  hemorrhage diagnosed on CT today following presentation with generalized weakness and confusion. History of abnormal brain MRI suspicious for an amyloid angiopathy. EXAM: CT HEAD WITHOUT CONTRAST TECHNIQUE: Contiguous axial images were obtained from the base of the skull through the vertex without intravenous contrast. COMPARISON:  Head CT 0721 hours today and earlier. FINDINGS: Brain: Large area of intra-axial hemorrhage in the posterior right hemisphere with layering hematocrit levels encompasses an area of 88 x 50 x 56 millimeters for an estimated intra-axial blood volume of 123 milliliters. Size and configuration is stable from 0721 hours today. Surrounding edema and regional mass effect are stable with leftward midline shift of 7 millimeters and subtotal effacement of the right  lateral ventricle. No intraventricular or extra-axial extension of blood is identified. Partial effacement of the ambient cisterns is stable. The remaining basilar cisterns are patent. Stable gray-white matter differentiation elsewhere. No definite acute ventricular enlargement. Vascular: Calcified atherosclerosis at the skull base. Skull: No acute osseous abnormality identified. Sinuses/Orbits: Stable and well pneumatized. Other: The patient is now intubated. No acute orbit or scalp soft tissue finding. IMPRESSION: 1. Stable large posterior right hemisphere intra-axial hemorrhage since 0721 hours today, which might be the sequelae of an amyloid angiopathy. Estimated blood volume 123 mL, with no intraventricular or extra-axial extension. 2. Stable associated mass effect with leftward midline shift of 7 mm and subtotal effacement of the right lateral ventricle. 3. No new intracranial abnormality. Electronically Signed   By: Odessa Fleming M.D.   On: 04/16/2018 14:39   Dg Chest Portable 1 View  Result Date: 04/16/2018 CLINICAL DATA:  ETT and OG tube placement EXAM: PORTABLE CHEST 1 VIEW COMPARISON:  04/16/2018 at 0839 hours FINDINGS:  Endotracheal tube terminates 3 cm above the carina. Mild left basilar opacity, likely atelectasis. No frank interstitial edema. No pleural effusion or pneumothorax. The heart is normal in size. Right IJ venous catheter terminates at the cavoatrial junction. Enteric tube courses into the stomach. IMPRESSION: Endotracheal tube terminates 3 cm above the carina. Additional support apparatus as above. Electronically Signed   By: Charline Bills M.D.   On: 04/16/2018 09:56   Dg Chest Portable 1 View  Result Date: 04/16/2018 CLINICAL DATA:  Central line placement EXAM: PORTABLE CHEST 1 VIEW COMPARISON:  02/08/2016 FINDINGS: Right jugular central venous catheter is in place with its tip at the cavoatrial junction. No pneumothorax. Normal heart size. Low lung volumes with bibasilar hypoaeration. Normal vascularity. No pleural effusion. IMPRESSION: Right jugular central venous catheter with its tip at the cavoatrial junction and no pneumothorax Low lung volumes. Electronically Signed   By: Jolaine Click M.D.   On: 04/16/2018 09:00   Ct Head Code Stroke Wo Contrast  Result Date: 04/16/2018 CLINICAL DATA:  Code stroke. Confusion with generalized weakness. Suspected cerebral amyloid angiopathy. EXAM: CT HEAD WITHOUT CONTRAST TECHNIQUE: Contiguous axial images were obtained from the base of the skull through the vertex without intravenous contrast. COMPARISON:  Multiple prior CT and MR imaging studies. FINDINGS: Brain: Acute RIGHT posterior temporal, occipital, and parietal hemorrhage, dissecting throughout the cortex and subcortical white matter, early midline shift. Cross-sectional measurements of 52 x 82 x 53 mm corresponds to an approximate volume of 112 mL, larger than the previous hemorrhages. Early uncal herniation. Slight brainstem rotation. Midline shift at the septum pellucidum level RIGHT-to-LEFT of 8 mm. Encephalomalacia LEFT occipital lobe from prior hemorrhage. No hydrocephalus. Vascular: No hyperdense vessel  or unexpected calcification. Skull: Normal. Negative for fracture or focal lesion. Sinuses/Orbits: No acute finding. Other: None. ASPECTS [facility] Stroke Program Early CT Score) Not applicable. IMPRESSION: 1. Large acute RIGHT hemisphere multilobar intracerebral hemorrhage, consistent with complication of cerebral amyloid angiopathy. Approximate volume 112 mL. Significant RIGHT-to-LEFT shift of 8 mm with early uncal herniation. 2. ASPECTS is not applicable. Critical Value/emergent results were called by telephone at the time of interpretation on 04/16/2018 at 7:25 am to Dr. Wilford Corner, who verbally acknowledged these results. Electronically Signed   By: Elsie Stain M.D.   On: 04/16/2018 07:42    PHYSICAL EXAM  middle-aged African-American gentleman who is sedated and intubated. . Afebrile. Head is nontraumatic. Neck is supple without bruit.    Cardiac exam no murmur or gallop. Lungs are clear  to auscultation. Distal pulses are well felt. Neurological Exam ;  sedated and intubated.eyes are closed. Right gaze preference and not able to look to the left even with reflex eye movements. Pupils are 3 mm irregular but reactive. Corneal reflexes are present. Fundi not visualized. Tongue midline. Left lower facial weakness. Flaccid left hemiplegia with no carotid painful stimuli. Purposeful antigravity movement on the right side. Follows few commands intermittently in the midline and in the right hand. Tone is normal on the right and diminished on the left. Right plantar equivocal and left is upgoing.  ASSESSMENT/PLAN [name] is a 63 y.o. 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   Stroke:   Large right temporoparietal hemorrhage in patient with known cerebral amyloid  Code Stroke CT head 2/2 0719 large R hemisphere multilobar ICH, vol 112 mL with significant right to left shift 8 mm and uncal herniation.   Likely amyloid.  CT head 2/2 1302 stable large right ICH, vol 123 mL. Stable right to left shift 7 mm.  Likely amyloid.  MRI  pending   MRA  pending   2D Echo pending  CT angiogram neck 12/21/2017 Mod L PCA stenosis. 3x4 R MCA aneurysm  LDL 103  HgbA1c 5.5  SCDs for VTE prophylaxis  aspirin 81 mg daily prior to admission, now on No antithrombotic given hemorrhage  Therapy recommendations: Pending  Disposition: Pending  Overall prognosis is poor.  Dr. Pearlean BrownieSethi discussed with her diagnosis and prognosis.  Wife wants full aggressive care.   Acute respiratory failure   Intubated  CCM consulted to manage vent and medical care  Cytotoxic cerebral edema  Induced hypernatremia  CT shows 7 to 8 mm shift and uncal herniation  On 3% protocol to decrease cerebral edema  Sodium currently 148  Goal sodium 150-155  Check sodium every 6 hours  Dysphagia   Secondary to ICH   N.p.o.   Febrile  likely secondary to ICH TM 100.6  Tylenol PRN  Cooling blanket  CCM for TTM if appropriate   Hypertensive emergency  Currently on Cleviprex   Systolic blood pressure goal less than 140   Hyperlipidemia  Home meds: Pravachol 80  LDL 103  Hold statin given ICH  Consider resumption of statin at discharge  Other Stroke Risk Factors  Former cigarette smoker, quit 24 years ago   Hx stroke/TIA  06/03/2017 R occipital SAH incidental, not related to presenting symptoms  10/2014 left occipital hemorrhage secondary to hypertension versus CAA.  Chronic right occipital ICH.  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 Agree with note above.he has presented with a large right parenchymal and subdural and subarachnoid hemorrhage with intraventricular extension and cytotoxic edema. Etiology probably amyloid angiopathy. His prognosis of surviving this and making meaningful recovery and living independently is quite slim. I explained this to the patient's wife but she wants full support and aggressive medical care. Recommend strict blood pressure  control with systolic blood pressure goal below 1 4424 hours and then below 160. Keep normothermic and euglycemic. Hypertonic saline for cytotoxic edema with serum sodium goal between 150-155. Long discussion with wife at the bedside and with Dr. Delton Coombes critical care medicine.This patient is critically ill and at significant risk of neurological worsening, death and care requires constant monitoring of vital signs, hemodynamics,respiratory and cardiac monitoring, extensive review of multiple databases, frequent neurological assessment, discussion with family, other specialists and medical decision making of high complexity.I have made any additions or clarifications directly to the above note.This critical care time does not reflect procedure time, or teaching time or supervisory time of PA/NP/Med Resident etc but could involve care discussion time.  I spent 50 minutes of neurocritical care time  in the care of  this patient.      Delia Heady, MD Medical Director [facility] Pager: [phone] 04/17/2018 4:19 PM  To contact Stroke Continuity provider, please refer to [email or web address]. After hours, contact General Neurology

## 2018-04-17 NOTE — Progress Notes (Signed)
SLP Cancellation Note  Patient Details Name: Luis Johnson MRN: [id number] DOB: [birth year]   Cancelled treatment:        Pt intubated. Will follow.    Royce Macadamia 04/17/2018, 8:01 AM   Breck Coons Lonell Face.Ed Nurse, children's [phone] Office [phone]

## 2018-04-17 NOTE — Progress Notes (Signed)
PT Cancellation Note  Patient Details Name: Vallon Riofrio MRN: [id number] DOB: 30-Aug-1955   Cancelled Treatment:    Reason Eval/Treat Not Completed: Patient not medically ready. Pt remains intubated, sedated, and on strict bedrest. Acute PT to return as able, as appropriate to complete PT evaluation.  Lewis Shock, PT, DPT Acute Rehabilitation Services Pager #: [phone] Office #: [phone]    Iona Hansen 04/17/2018, 9:24 AM

## 2018-04-17 NOTE — Progress Notes (Signed)
Informed MD Pearlean Brownie from neurology of Na+ 147. Instructed to increase 3% rate to 74mL/hr through central line.

## 2018-04-17 NOTE — Progress Notes (Signed)
Initial Nutrition Assessment  DOCUMENTATION CODES:   Non-severe (moderate) malnutrition in context of chronic illness  INTERVENTION:   Initiate Vital High Protein @ 20 ml/hr via OG tube 60 ml Prostat BID MVI daily  Provides: 880 kcal, 102 grams protein, and 401 ml free water TF regimen with cleviprex and propofol at current rate providing 3177 total kcal/day (exceeds % of kcal needs)   NUTRITION DIAGNOSIS:   Moderate Malnutrition related to chronic illness(HTN) as evidenced by mild fat depletion, moderate fat depletion, mild muscle depletion, moderate muscle depletion.  GOAL:   Provide needs based on ASPEN/SCCM guidelines  MONITOR:   TF tolerance, I & O's  REASON FOR ASSESSMENT:   Consult, Ventilator Enteral/tube feeding initiation and management  ASSESSMENT:   Pt with PMH of HTN, amyloid angiopathy, and prior ICH admitted 2/2 for large R IPH.    No family present during exam. Spoke with RN at bedside.  Pt remains on cleviprex and propofol  Exam indicated moderate malnutrition  Patient is currently intubated on ventilator support MV: 8.5 L/min Temp (24hrs), Avg:99.5 F (37.5 C), Min:99 F (37.2 C), Max:100.6 F (38.1 C)  Propofol: 25.2 ml/hr(60 mcg) = 665 kcal Cleviprex: 34 ml/hr = 1632 kcal Medications reviewed and include: SSI every 4 hours, hypertonic saline, senokot-s BID Labs reviewed BP: 138/83  MAP: 98   I/O: +1532 ml since admit UOP: 700 ml x 24 hrs     NUTRITION - FOCUSED PHYSICAL EXAM:    Most Recent Value  Orbital Region  Mild depletion  Upper Arm Region  Moderate depletion  Thoracic and Lumbar Region  No depletion  Buccal Region  Unable to assess  Temple Region  Mild depletion  Clavicle Bone Region  Mild depletion  Clavicle and Acromion Bone Region  Moderate depletion  Scapular Bone Region  Unable to assess  Dorsal Hand  Moderate depletion  Patellar Region  Moderate depletion  Anterior Thigh Region  Moderate depletion  Posterior  Calf Region  Mild depletion  Edema (RD Assessment)  None  Hair  Reviewed  Eyes  Unable to assess  Mouth  Unable to assess  Skin  Reviewed  Nails  Reviewed       Diet Order:   Diet Order            Diet NPO time specified  Diet effective now              EDUCATION NEEDS:   No education needs have been identified at this time  Skin:  Skin Assessment: Reviewed RN Assessment  Last BM:  unknown  Height:   Ht Readings from Last 1 Encounters:  04/17/18 5\' 7"  (1.702 m)    Weight:   Wt Readings from Last 1 Encounters:  04/16/18 70.5 kg    Ideal Body Weight:  67.2 kg  BMI:  Body mass index is 24.34 kg/m.  Estimated Nutritional Needs:   Kcal:  1768  Protein:  90-105 grams  Fluid:  >1.7 L/day  Kendell Bane RD, LDN, CNSC [phone] Pager [phone] After Hours Pager

## 2018-04-18 ENCOUNTER — Inpatient Hospital Stay (HOSPITAL_COMMUNITY): Payer: Medicare Other

## 2018-04-18 DIAGNOSIS — E440 Moderate protein-calorie malnutrition: Secondary | ICD-10-CM | POA: Diagnosis present

## 2018-04-18 LAB — CBC
HCT: 46 % (ref 39.0–52.0)
Hemoglobin: 14 g/dL (ref 13.0–17.0)
MCH: 26.4 pg (ref 26.0–34.0)
MCHC: 30.4 g/dL (ref 30.0–36.0)
MCV: 86.6 fL (ref 80.0–100.0)
Platelets: 219 10*3/uL (ref 150–400)
RBC: 5.31 MIL/uL (ref 4.22–5.81)
RDW: 17.6 % — ABNORMAL HIGH (ref 11.5–15.5)
WBC: 11.7 10*3/uL — ABNORMAL HIGH (ref 4.0–10.5)
nRBC: 0 % (ref 0.0–0.2)

## 2018-04-18 LAB — GLUCOSE, CAPILLARY
GLUCOSE-CAPILLARY: 98 mg/dL (ref 70–99)
Glucose-Capillary: 110 mg/dL — ABNORMAL HIGH (ref 70–99)
Glucose-Capillary: 123 mg/dL — ABNORMAL HIGH (ref 70–99)
Glucose-Capillary: 130 mg/dL — ABNORMAL HIGH (ref 70–99)
Glucose-Capillary: 143 mg/dL — ABNORMAL HIGH (ref 70–99)
Glucose-Capillary: 192 mg/dL — ABNORMAL HIGH (ref 70–99)

## 2018-04-18 LAB — BASIC METABOLIC PANEL
Anion gap: 6 (ref 5–15)
BUN: 13 mg/dL (ref 8–23)
CO2: 24 mmol/L (ref 22–32)
Calcium: 8.6 mg/dL — ABNORMAL LOW (ref 8.9–10.3)
Chloride: 121 mmol/L — ABNORMAL HIGH (ref 98–111)
Creatinine, Ser: 0.77 mg/dL (ref 0.61–1.24)
GFR calc Af Amer: >60 mL/min (ref >60)
GFR calc non Af Amer: >60 mL/min (ref >60)
Glucose, Bld: 147 mg/dL — ABNORMAL HIGH (ref 70–99)
Potassium: 5 mmol/L (ref 3.5–5.1)
Sodium: 151 mmol/L — ABNORMAL HIGH (ref 135–145)

## 2018-04-18 LAB — MAGNESIUM
Magnesium: 2.3 mg/dL (ref 1.7–2.4)
Magnesium: 2.8 mg/dL — ABNORMAL HIGH (ref 1.7–2.4)

## 2018-04-18 LAB — PHOSPHORUS
Phosphorus: 2 mg/dL — ABNORMAL LOW (ref 2.5–4.6)
Phosphorus: 2.1 mg/dL — ABNORMAL LOW (ref 2.5–4.6)

## 2018-04-18 LAB — SODIUM
Sodium: 148 mmol/L — ABNORMAL HIGH (ref 135–145)
Sodium: 150 mmol/L — ABNORMAL HIGH (ref 135–145)
Sodium: 142 mmol/L (ref 135–145)

## 2018-04-18 LAB — TRIGLYCERIDES: Triglycerides: 194 mg/dL — ABNORMAL HIGH (ref <150)

## 2018-04-18 MED ORDER — VERAPAMIL HCL 40 MG PO TABS
40.0000 mg | ORAL_TABLET | Freq: Three times a day (TID) | ORAL | Status: DC
  Administered 2018-04-18 – 2018-04-19 (×3): 40 mg via ORAL
  Filled 2018-04-18 (×4): qty 1

## 2018-04-18 MED ORDER — LISINOPRIL 10 MG PO TABS
10.0000 mg | ORAL_TABLET | Freq: Every day | ORAL | Status: DC
  Administered 2018-04-18 – 2018-04-20 (×3): 10 mg via ORAL
  Filled 2018-04-18 (×3): qty 1

## 2018-04-18 MED ORDER — SENNOSIDES-DOCUSATE SODIUM 8.6-50 MG PO TABS
1.0000 | ORAL_TABLET | Freq: Two times a day (BID) | ORAL | Status: DC
  Administered 2018-04-18 – 2018-04-24 (×11): 1
  Filled 2018-04-18 (×11): qty 1

## 2018-04-18 MED ORDER — PANTOPRAZOLE SODIUM 40 MG PO PACK
40.0000 mg | PACK | Freq: Every day | ORAL | Status: DC
  Administered 2018-04-18 – 2018-04-24 (×7): 40 mg
  Filled 2018-04-18 (×7): qty 20

## 2018-04-18 MED ORDER — IPRATROPIUM-ALBUTEROL 0.5-2.5 (3) MG/3ML IN SOLN
3.0000 mL | Freq: Four times a day (QID) | RESPIRATORY_TRACT | Status: DC | PRN
  Administered 2018-04-22: 3 mL via RESPIRATORY_TRACT
  Filled 2018-04-18: qty 3

## 2018-04-18 MED ORDER — HYDRALAZINE HCL 20 MG/ML IJ SOLN
25.0000 mg | Freq: Three times a day (TID) | INTRAMUSCULAR | Status: DC | PRN
  Administered 2018-04-21 (×2): 25 mg via INTRAVENOUS
  Filled 2018-04-18 (×2): qty 2

## 2018-04-18 NOTE — Progress Notes (Signed)
   NAME:  Luis Johnson, MRN:  [id number], DOB:  Jul 12, 1955, LOS: 2 ADMISSION DATE:  04/16/2018, CONSULTATION DATE:  04/17/18 REFERRING MD:  Pearlean Brownie  CHIEF COMPLAINT:  AMS   Brief History   Luis Johnson is a 63 y.o. male who was admitted 2/2 with ICH and required intubation for airway protection. He has been started on 3% NS by neurology.  Past Medical History  ICH, SAH, HTN, amyloid angiopathy.  Significant Hospital Events   2/2 > admit.  Consults:  PCCM.  Procedures:  ETT 2/2 >  R IJ CVL 2/2 >   Significant Diagnostic Tests:  CT head 2/2 > large posterior right hemisphere intra axial hemorrhage.  Stable associated mass effect with 7mm L to R MLS. MRI brain 2/3 > acute to subacute large right temporal occipital hematoma with 66mm R to L MLS.  Small vessel changes c/w amyloid angiopathy / angitis.  Old left occipital hemorrhage.   Echo 2/3 > EF > 65%, impaired diastolic filling pattern.  Micro Data:  None.  Antimicrobials:  None.   Interim history/subjective:  Sedated, comfortable.  Does not follow commands.  Objective:  Blood pressure (!) 148/79, pulse (!) 122, temperature 100.2 F (37.9 C), temperature source Axillary, resp. rate (!) 23, height 5\' 7"  (1.702 m), weight 70.3 kg, SpO2 96 %.    Vent Mode: CPAP;PSV FiO2 (%):  [30 %-40 %] 30 % Set Rate:  [16 bmp] 16 bmp Vt Set:  [470 mL] 470 mL PEEP:  [5 cmH20] 5 cmH20 Pressure Support:  [5 cmH20] 5 cmH20 Plateau Pressure:  [10 cmH20-18 cmH20] 10 cmH20   Intake/Output Summary (Last 24 hours) at 04/18/2018 0842 Last data filed at 04/18/2018 0800 Gross per 24 hour  Intake 2714.41 ml  Output 1250 ml  Net 1464.41 ml   Filed Weights   04/16/18 0741 04/18/18 0500  Weight: 70.5 kg 70.3 kg    Examination: General: Adult male, in NAD. Neuro: Sedated, non responsive, does not follow commands. HEENT: [place]/AT. Sclerae anicteric.  ETT in place. Cardiovascular: RRR, no M/R/G.  Lungs: Respirations even and unlabored.  CTA bilaterally, No  W/R/R. Abdomen: BS x 4, soft, NT/ND.  Musculoskeletal: No gross deformities, no edema.  Skin: Intact, warm, no rashes.  Assessment & Plan:   Large right IPH. - Stroke workup / management per neuro. - Continue 3% NS per neuro. - NSGY consult if worsens. - ? Empiric keppra / AED.  Will defer to neurology.  Respiratory insufficiency - due to above. - Full vent support. - No weaning unless mental status improves. - Bronchial hygiene. - Follow CXR.  Hx HTN now with Hypertensive emergency. - Continue cleviprex per neuro, goal SBP < 140. - Continue labetalol PRN. - Hold preadmission ASA, prinzide, verapamil.  Hypernatremia - due to 3% NS. - Per neurology.   Best Practice:  Diet: NPO.  Continue TF's. Pain/Anxiety/Delirium protocol (if indicated): Propofol gtt / Fetanyl PRN.  RASS goal 0 to -1. VAP protocol (if indicated): In place. DVT prophylaxis: SCD's. GI prophylaxis: PPI. Glucose control: SSI. Mobility: Bedrest. Code Status: Full.  Family Communication: None available. Disposition: ICU.   Critical care time: 35 min.    Rutherford Guys, PA [facility] Pager: [phone].  If no answer, [phone] 04/18/2018, 8:42 AM

## 2018-04-18 NOTE — Progress Notes (Signed)
OT Cancellation Note  Patient Details Name: Luis Johnson MRN: [id number] DOB: 1956/02/06   Cancelled Treatment:    Reason Eval/Treat Not Completed: Active bedrest order;Patient not medically ready. Patient remains intubated and sedated. Will continue to follow, will initiate OT eval when appropriate and able.   Chancy Milroy, OT Acute Rehabilitation Services Pager [phone] Office [phone]    Chancy Milroy 04/18/2018, 12:15 PM

## 2018-04-18 NOTE — Progress Notes (Signed)
Transported patient to MRI without complications.

## 2018-04-18 NOTE — Progress Notes (Signed)
2200 Na level was 142. Paged Dr. Amada Jupiter with verbal orders to redraw a STAT Sodium level and increase 3% to 171ml/hr until results were available. Will continue to monitor.

## 2018-04-18 NOTE — Progress Notes (Signed)
PT Cancellation Note  Patient Details Name: Luis Johnson MRN: [id number] DOB: 08/23/1955   Cancelled Treatment:    Reason Eval/Treat Not Completed: Patient not medically ready. Pt remains intubated, sedated and on 3%, not appropriate for PT at this time. PT to return as able, as appropriate to complete PT eval.  Lewis Shock, PT, DPT Acute Rehabilitation Services Pager #: [phone] Office #: [phone]    Iona Hansen 04/18/2018, 8:28 AM

## 2018-04-18 NOTE — Progress Notes (Signed)
Patient transported from MRI back to 4N20 without complications. Vitals stable.

## 2018-04-18 NOTE — Progress Notes (Signed)
STROKE TEAM PROGRESS NOTE   INTERVAL HISTORY His wife is at the bedside.    Wife wants all aggressive measures and full care.His condition remains unchanged. BP adequately controlled. Serum Na at goal on hypertonic saline. Temp ok. Remains tachycardic  Vitals:   04/18/18 1400 04/18/18 1522 04/18/18 1601 04/18/18 1602  BP: 130/79 (!) 141/83  139/79  Pulse: (!) 108 (!) 118    Resp: 18 14    Temp:   99.4 F (37.4 C)   TempSrc:   Axillary   SpO2: 96% 93%    Weight:      Height:        CBC:  Recent Labs  Lab 04/16/18 0739 04/16/18 1053 04/18/18 0409  WBC 9.1  --  11.7*  NEUTROABS 8.0*  --   --   HGB 15.3 15.3 14.0  HCT 50.0 45.0 46.0  MCV 84.9  --  86.6  PLT 167  --  219    Basic Metabolic Panel:  Recent Labs  Lab 04/16/18 0739 04/16/18 0743 04/16/18 1053  04/17/18 1811  04/18/18 0610 04/18/18 1140  NA 138  138  --  138   < > 150*   < > 151* 148*  K 5.3*  --  3.3*  --   --   --  5.0  --   CL 100  --   --   --   --   --  121*  --   CO2 22  --   --   --   --   --  24  --   GLUCOSE 119*  --   --   --   --   --  147*  --   BUN 17  --   --   --   --   --  13  --   CREATININE 1.06 0.90  --   --   --   --  0.77  --   CALCIUM 9.7  --   --   --   --   --  8.6*  --   MG  --   --   --   --  2.5*  --  2.3  --   PHOS  --   --   --   --  2.5  --  2.0*  --    < > = values in this interval not displayed.   Lipid Panel:     Component Value Date/Time   CHOL 169 06/01/2017 0551   TRIG 194 (H) 04/18/2018 0610   HDL 47 06/01/2017 0551   CHOLHDL 3.6 06/01/2017 0551   VLDL 19 06/01/2017 0551   LDLCALC 103 (H) 06/01/2017 0551   HgbA1c:  Lab Results  Component Value Date   HGBA1C 5.5 06/01/2017   Urine Drug Screen:     Component Value Date/Time   LABOPIA NONE DETECTED 12/21/2017 2014   COCAINSCRNUR NONE DETECTED 12/21/2017 2014   LABBENZ NONE DETECTED 12/21/2017 2014   AMPHETMU NONE DETECTED 12/21/2017 2014   THCU NONE DETECTED 12/21/2017 2014   LABBARB NONE DETECTED  12/21/2017 2014    Alcohol Level     Component Value Date/Time   ETH <10 12/21/2017 1644    IMAGING [name] Head Wo Contrast  Result Date: 04/18/2018 CLINICAL DATA:  Headache, follow-up hemorrhage. History of hypertension, amyloid angiopathy and intracranial hemorrhage. EXAM: MRI HEAD WITHOUT CONTRAST MRA HEAD WITHOUT CONTRAST TECHNIQUE: Multiplanar, multiecho pulse sequences of the brain and surrounding structures were obtained without  intravenous contrast. Angiographic images of the head were obtained using MRA technique without contrast. COMPARISON:  CT HEAD April 16, 2018 and MRI head December 21, 2017 and MRA head May 31, 2017. FINDINGS: MRI HEAD FINDINGS INTRACRANIAL CONTENTS: Spurs reduced diffusion with susceptibility artifact corresponding to known RIGHT temporal occipital intraparenchymal hematoma, patchy T1 shortening. Surrounding vasogenic edema with 5 mm RIGHT to LEFT midline shift. Partially effaced RIGHT lateral ventricle without LEFT ventricular entrapment. Ex vacuo dilatation LEFT occipital horn. Old LEFT occipital lobe hemorrhage and encephalomalacia. 1 cm reduced diffusion and low ADC values RIGHT frontal lobe. Extensive supra and infratentorial chronic microhemorrhages with posterior predominance. Patchy supratentorial white matter FLAIR T2 hyperintensities. Prominent perivascular spaces with juxta cortical T2 hyperintense signal, unchanged. VASCULAR: Normal major intracranial vascular flow voids present at skull base. SKULL AND UPPER CERVICAL SPINE: No abnormal sellar expansion. No suspicious calvarial bone marrow signal. Craniocervical junction maintained. SINUSES/ORBITS: The mastoid air-cells and included paranasal sinuses are well-aerated.The included ocular globes and orbital contents are non-suspicious. OTHER: Life support lines in place. MRA HEAD FINDINGS ANTERIOR CIRCULATION: Normal flow related enhancement of the included cervical, petrous, cavernous and supraclinoid internal  carotid arteries. Patent anterior communicating artery. Patent anterior and middle cerebral arteries. Mild stenosis RIGHT M 1 origin. 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 No large vessel occlusion, flow limiting stenosis. POSTERIOR CIRCULATION: Codominant vertebral arteries. Vertebrobasilar arteries are patent, with normal flow related enhancement of the main branch vessels. Patent posterior cerebral arteries. New mild stenosis LEFT P 2 segment. No large vessel occlusion, flow limiting stenosis,  aneurysm. ANATOMIC VARIANTS: None. Source images and MIP images were reviewed. IMPRESSION: MRI HEAD: 1. Acute to subacute large RIGHT temporal occipital hematoma with 5 mm RIGHT-to-LEFT midline shift. No ventricular entrapment. 2. Extensive chronic microhemorrhages in advanced chronic small vessel ischemic changes seen with amyloid angiopathy/angitis. 3. Old LEFT occipital hemorrhage. MRA HEAD: 1. No emergent large vessel occlusion or flow-limiting stenosis. Mild stenosis RIGHT M1 and LEFT P2 segment. 2. 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 Electronically Signed   By: Awilda Metro M.D.   On: 04/18/2018 02:38   Mr Brain Wo Contrast  Result Date: 04/18/2018 CLINICAL DATA:  Headache, follow-up hemorrhage. History of hypertension, amyloid angiopathy and intracranial hemorrhage. EXAM: MRI HEAD WITHOUT CONTRAST MRA HEAD WITHOUT CONTRAST TECHNIQUE: Multiplanar, multiecho pulse sequences of the brain and surrounding structures were obtained without intravenous contrast. Angiographic images of the head were obtained using MRA technique without contrast. COMPARISON:  CT HEAD April 16, 2018 and MRI head December 21, 2017 and MRA head May 31, 2017. FINDINGS: MRI HEAD FINDINGS INTRACRANIAL CONTENTS: Spurs reduced diffusion with susceptibility artifact corresponding to known RIGHT temporal occipital intraparenchymal hematoma, patchy T1 shortening. Surrounding vasogenic edema with 5 mm RIGHT to LEFT midline  shift. Partially effaced RIGHT lateral ventricle without LEFT ventricular entrapment. Ex vacuo dilatation LEFT occipital horn. Old LEFT occipital lobe hemorrhage and encephalomalacia. 1 cm reduced diffusion and low ADC values RIGHT frontal lobe. Extensive supra and infratentorial chronic microhemorrhages with posterior predominance. Patchy supratentorial white matter FLAIR T2 hyperintensities. Prominent perivascular spaces with juxta cortical T2 hyperintense signal, unchanged. VASCULAR: Normal major intracranial vascular flow voids present at skull base. SKULL AND UPPER CERVICAL SPINE: No abnormal sellar expansion. No suspicious calvarial bone marrow signal. Craniocervical junction maintained. SINUSES/ORBITS: The mastoid air-cells and included paranasal sinuses are well-aerated.The included ocular globes and orbital contents are non-suspicious. OTHER: Life support lines in place. MRA HEAD FINDINGS ANTERIOR CIRCULATION: Normal flow related enhancement of the  included cervical, petrous, cavernous and supraclinoid internal carotid arteries. Patent anterior communicating artery. Patent anterior and middle cerebral arteries. Mild stenosis RIGHT M 1 origin. 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 No large vessel occlusion, flow limiting stenosis. POSTERIOR CIRCULATION: Codominant vertebral arteries. Vertebrobasilar arteries are patent, with normal flow related enhancement of the main branch vessels. Patent posterior cerebral arteries. New mild stenosis LEFT P 2 segment. No large vessel occlusion, flow limiting stenosis,  aneurysm. ANATOMIC VARIANTS: None. Source images and MIP images were reviewed. IMPRESSION: MRI HEAD: 1. Acute to subacute large RIGHT temporal occipital hematoma with 5 mm RIGHT-to-LEFT midline shift. No ventricular entrapment. 2. Extensive chronic microhemorrhages in advanced chronic small vessel ischemic changes seen with amyloid angiopathy/angitis. 3. Old LEFT occipital hemorrhage. MRA HEAD: 1. No  emergent large vessel occlusion or flow-limiting stenosis. Mild stenosis RIGHT M1 and LEFT P2 segment. 2. 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 Electronically Signed   By: Awilda Metroourtnay  Bloomer M.D.   On: 04/18/2018 02:38   Portable Chest Xray  Result Date: 04/18/2018 CLINICAL DATA:  Respiratory failure EXAM: PORTABLE CHEST 1 VIEW COMPARISON:  04/16/2018 FINDINGS: Cardiac shadow is stable. Endotracheal tube, right jugular central line and gastric catheter are noted in satisfactory position. Lungs are well aerated bilaterally. No focal infiltrate or sizable effusion is seen. Left shoulder replacement is noted. IMPRESSION: Tubes and lines as described. No acute abnormality noted. Electronically Signed   By: Alcide CleverMark  Lukens M.D.   On: 04/18/2018 08:22    PHYSICAL EXAM  middle-aged African-American gentleman who is sedated and intubated. . Afebrile. Head is nontraumatic. Neck is supple without bruit.    Cardiac exam no murmur or gallop. Lungs are clear to auscultation. Distal pulses are well felt. Neurological Exam ;  sedated and intubated.eyes are closed. Right gaze preference and not able to look to the left even with reflex eye movements. Pupils are 3 mm irregular but reactive. Corneal reflexes are present. Fundi not visualized. Tongue midline. Left lower facial weakness. Flaccid left hemiplegia with no carotid painful stimuli. Purposeful antigravity movement on the right side. Follows few commands intermittently in the midline and in the right hand. Tone is normal on the right and diminished on the left. Right plantar equivocal and left is upgoing.  ASSESSMENT/PLAN Mr. Antionette Polesony Koltz is a 63 y.o. 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   Stroke:   Large right temporoparietal hemorrhage in patient with known cerebral amyloid  Code Stroke CT head 2/2 0719 large R hemisphere multilobar ICH, vol  112 mL with significant right to left shift 8 mm and uncal herniation.  Likely amyloid.  CT head 2/2 1302 stable large right ICH, vol 123 mL. Stable right to left shift 7 mm.  Likely amyloid.  MRI  Large right temporal occipital hematoma with 5 mm right-to-left shift no hydrocephalus. Extensive chronic microhemorrhages. Old left occipital hemorrhage.  MRA  No large vessel stenosis or occlusion. 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  2D Echo left ventricle has hyperdynamic systolic function of >65%. The cavity size is normal. There is no left ventricular wall thickness. Echo evidence of impaired relaxation diastolic filling patterns  CT angiogram neck 12/21/2017 Mod L PCA stenosis. 3x4 R MCA aneurysm  LDL 103  HgbA1c 5.5  SCDs for VTE prophylaxis  aspirin 81 mg daily prior to admission, now on No antithrombotic given hemorrhage  Therapy recommendations: Pending  Disposition: Pending  Overall prognosis is poor.  Dr. Pearlean Brownie discussed with her diagnosis and prognosis.  Wife wants full aggressive care.   Acute respiratory failure   Intubated  CCM consulted to manage vent and medical care  Cytotoxic cerebral edema  Induced hypernatremia  CT shows 7 to 8 mm shift and uncal herniation  On 3% protocol to decrease cerebral edema  Sodium currently 148  Goal sodium 150-155  Check sodium every 6 hours  Dysphagia   Secondary to ICH   N.p.o.   Febrile  likely secondary to ICH TM 100.6  Tylenol PRN  Cooling blanket  CCM for TTM if appropriate   Hypertensive emergency  Currently on Cleviprex   Systolic blood pressure goal less than 140   Hyperlipidemia  Home meds: Pravachol 80  LDL 103  Hold statin given ICH  Consider resumption of statin at discharge  Other Stroke Risk Factors  Former cigarette smoker, quit 24 years ago   Hx stroke/TIA  06/03/2017 R occipital SAH incidental, not related to presenting symptoms  10/2014 left occipital hemorrhage  secondary to hypertension versus CAA.  Chronic right occipital ICH.  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 Etiology probably amyloid angiopathy. His prognosis of surviving this and making meaningful recovery and living independently is quite slim. I explained this to the patient's wife but she wants full support and aggressive medical care. Recommend strict blood pressure control with systolic blood pressure goal below  160. Keep normothermic and euglycemic. Hypertonic saline for cytotoxic edema with serum sodium goal between 150-155. Long discussion with wife at the bedside and with Dr. Denese Killings critical care medicine.This patient is critically ill and at significant risk of neurological worsening, death and care requires constant monitoring of vital signs, hemodynamics,respiratory and cardiac monitoring, extensive review of multiple databases, frequent neurological assessment, discussion with family, other specialists and medical decision making of high complexity.I have made any additions or clarifications directly to the above note.This critical care time does not reflect procedure time, or teaching time or supervisory time of PA/NP/Med Resident etc but could involve care discussion time.  I spent 35 minutes of neurocritical care time  in the care of  this patient.      Delia Heady, MD Medical Director [facility] Pager: [phone] 04/18/2018 4:13 PM  To contact Stroke Continuity provider, please refer to [email or web address]. After hours, contact General Neurology

## 2018-04-19 ENCOUNTER — Inpatient Hospital Stay (HOSPITAL_COMMUNITY): Payer: Medicare Other

## 2018-04-19 LAB — CBC
HCT: 44.7 % (ref 39.0–52.0)
Hemoglobin: 13.8 g/dL (ref 13.0–17.0)
MCH: 26.6 pg (ref 26.0–34.0)
MCHC: 30.9 g/dL (ref 30.0–36.0)
MCV: 86.3 fL (ref 80.0–100.0)
NRBC: 0 % (ref 0.0–0.2)
PLATELETS: 193 10*3/uL (ref 150–400)
RBC: 5.18 MIL/uL (ref 4.22–5.81)
RDW: 17 % — ABNORMAL HIGH (ref 11.5–15.5)
WBC: 12.5 10*3/uL — ABNORMAL HIGH (ref 4.0–10.5)

## 2018-04-19 LAB — GLUCOSE, CAPILLARY
GLUCOSE-CAPILLARY: 110 mg/dL — AB (ref 70–99)
Glucose-Capillary: 99 mg/dL (ref 70–99)
Glucose-Capillary: 121 mg/dL — ABNORMAL HIGH (ref 70–99)
Glucose-Capillary: 130 mg/dL — ABNORMAL HIGH (ref 70–99)
Glucose-Capillary: 99 mg/dL (ref 70–99)
Glucose-Capillary: 110 mg/dL — ABNORMAL HIGH (ref 70–99)

## 2018-04-19 LAB — BASIC METABOLIC PANEL
ANION GAP: 10 (ref 5–15)
BUN: 13 mg/dL (ref 8–23)
CO2: 25 mmol/L (ref 22–32)
Calcium: 9.2 mg/dL (ref 8.9–10.3)
Chloride: 124 mmol/L — ABNORMAL HIGH (ref 98–111)
Creatinine, Ser: 0.83 mg/dL (ref 0.61–1.24)
GFR calc Af Amer: >60 mL/min (ref >60)
GFR calc non Af Amer: >60 mL/min (ref >60)
Glucose, Bld: 142 mg/dL — ABNORMAL HIGH (ref 70–99)
Potassium: 3 mmol/L — ABNORMAL LOW (ref 3.5–5.1)
Sodium: 159 mmol/L — ABNORMAL HIGH (ref 135–145)

## 2018-04-19 LAB — SODIUM
Sodium: 160 mmol/L — ABNORMAL HIGH (ref 135–145)
Sodium: 160 mmol/L — ABNORMAL HIGH (ref 135–145)
Sodium: 161 mmol/L (ref 135–145)
Sodium: 162 mmol/L (ref 135–145)

## 2018-04-19 LAB — PHOSPHORUS: Phosphorus: 3.1 mg/dL (ref 2.5–4.6)

## 2018-04-19 LAB — MAGNESIUM: Magnesium: 2.2 mg/dL (ref 1.7–2.4)

## 2018-04-19 MED ORDER — POTASSIUM CHLORIDE 20 MEQ/15ML (10%) PO SOLN
40.0000 meq | Freq: Once | ORAL | Status: AC
  Administered 2018-04-19: 40 meq
  Filled 2018-04-19: qty 30

## 2018-04-19 MED ORDER — FENTANYL BOLUS VIA INFUSION
50.0000 ug | INTRAVENOUS | Status: DC | PRN
  Filled 2018-04-19: qty 50

## 2018-04-19 MED ORDER — VERAPAMIL HCL 40 MG PO TABS
40.0000 mg | ORAL_TABLET | Freq: Three times a day (TID) | ORAL | Status: DC
  Administered 2018-04-19 – 2018-04-24 (×15): 40 mg
  Filled 2018-04-19 (×18): qty 1

## 2018-04-19 MED ORDER — FENTANYL CITRATE (PF) 100 MCG/2ML IJ SOLN
50.0000 ug | INTRAMUSCULAR | Status: DC | PRN
  Administered 2018-04-19 (×4): 50 ug via INTRAVENOUS
  Filled 2018-04-19 (×3): qty 2

## 2018-04-19 MED ORDER — VITAL HIGH PROTEIN PO LIQD
1000.0000 mL | ORAL | Status: DC
  Administered 2018-04-19 – 2018-04-24 (×6): 1000 mL
  Filled 2018-04-19 (×2): qty 1000

## 2018-04-19 MED ORDER — FENTANYL 2500MCG IN NS 250ML (10MCG/ML) PREMIX INFUSION
0.0000 ug/h | INTRAVENOUS | Status: DC
  Administered 2018-04-19: 50 ug/h via INTRAVENOUS
  Filled 2018-04-19 (×4): qty 250

## 2018-04-19 MED ORDER — HEPARIN SODIUM (PORCINE) 5000 UNIT/ML IJ SOLN
5000.0000 [IU] | Freq: Three times a day (TID) | INTRAMUSCULAR | Status: DC
  Administered 2018-04-19 – 2018-04-27 (×24): 5000 [IU] via SUBCUTANEOUS
  Filled 2018-04-19 (×24): qty 1

## 2018-04-19 MED ORDER — HYDRALAZINE HCL 10 MG PO TABS
10.0000 mg | ORAL_TABLET | Freq: Three times a day (TID) | ORAL | Status: DC
  Administered 2018-04-19 – 2018-04-21 (×9): 10 mg
  Filled 2018-04-19 (×11): qty 1

## 2018-04-19 NOTE — Progress Notes (Signed)
New Na draw was 161. Dr. Amada Jupiter notified. Orders to turn off 3% for now. Will continue to monitor.

## 2018-04-19 NOTE — Procedures (Signed)
Cortrak  Person Inserting Tube:  Sonia Bromell C, RD Tube Type:  Cortrak - 43 inches Tube Location:  Right nare Initial Placement:  Postpyloric Secured by: Bridle Technique Used to Measure Tube Placement:  Documented cm marking at nare/ corner of mouth Cortrak Secured At:  84 cm    Cortrak Tube Team Note:  Consult received to place a Cortrak feeding tube.   No x-ray is required. RN may begin using tube.   If the tube becomes dislodged please keep the tube and contact the Cortrak team at [email or web address] ([email or web address]) for replacement.  If after hours and replacement cannot be delayed, place a NG tube and confirm placement with an abdominal x-ray.    Luis Johnson RD, LDN, CNSC [phone] Pager [phone] After Hours Pager   

## 2018-04-19 NOTE — Progress Notes (Signed)
NAME:  Luis Johnson, MRN:  [id number], DOB:  1955/06/04, LOS: 3 ADMISSION DATE:  04/16/2018, CONSULTATION DATE:  04/17/2018 REFERRING MD:  Cherly Beach, CHIEF COMPLAINT:  AMS   HPI/course in hospital  Luis Johnson is a 63 y.o. male who was admitted 2/2 with ICH and required intubation for airway protection. He has been started on 3% NS by neurology for associated midline shift due to vasogenic edema. ICH deemed to be due to amyloid angiopathy.  Past Medical History   Past Medical History:  Diagnosis Date  . Hypertension   . Stroke [facility])      Past Surgical History:  Procedure Laterality Date  . ARTERY BIOPSY Bilateral 06/03/2017   Procedure: BILATERAL TEMPORAL ARTERY BIOPSY;  Surgeon: Fransisco Hertz, MD;  Location: [facility] OR;  Service: Vascular;  Laterality: Bilateral;  . IR 3D INDEPENDENT WKST  06/02/2017  . IR ANGIO INTRA EXTRACRAN SEL COM CAROTID INNOMINATE BILAT MOD SED  06/02/2017  . IR ANGIO VERTEBRAL SEL VERTEBRAL BILAT MOD SED  06/02/2017  . SHOULDER SURGERY      Interim history/subjective:  Off propofol today with increased spontaneous movement of right side.   Objective   Blood pressure 138/78, pulse (!) 123, temperature (!) 100.9 F (38.3 C), temperature source Axillary, resp. rate (!) 28, height 5\' 7"  (1.702 m), weight 70.3 kg, SpO2 92 %.    Vent Mode: CPAP;PSV FiO2 (%):  [30 %] 30 % Set Rate:  [16 bmp] 16 bmp Vt Set:  [470 mL] 470 mL PEEP:  [5 cmH20] 5 cmH20 Pressure Support:  [5 cmH20] 5 cmH20 Plateau Pressure:  [9 cmH20-14 cmH20] 9 cmH20   Intake/Output Summary (Last 24 hours) at 04/19/2018 0906 Last data filed at 04/19/2018 0700 Gross per 24 hour  Intake 2241.9 ml  Output 2750 ml  Net -508.1 ml   Filed Weights   04/16/18 0741 04/18/18 0500  Weight: 70.5 kg 70.3 kg    Examination: Physical Exam  Nursing note and vitals reviewed. Constitutional: He appears distressed (mild distress, patient repetetively moving right side). He is intubated (with no ventilator  dyssynchrony).  HENT:  ETT and OGT in place  Eyes: Conjunctivae are normal.  Neck: No JVD present. No tracheal deviation present.  Cardiovascular: Regular rhythm, S1 normal, S2 normal, normal heart sounds and normal pulses. Tachycardia present.  Respiratory: Breath sounds normal. No accessory muscle usage. He is intubated (with no ventilator dyssynchrony). No respiratory distress.  GI: Soft. There is no abdominal tenderness.  Neurological: He has normal strength. He is unresponsive. No cranial nerve deficit. GCS eye subscore is 2. GCS verbal subscore is 1. GCS motor subscore is 5.  Actively resists eye opening. Spontaneously moves right side.     Ancillary tests (personally reviewed)  CBC: Recent Labs  Lab 04/16/18 0739 04/16/18 1053 04/18/18 0409 04/19/18 0557  WBC 9.1  --  11.7* 12.5*  NEUTROABS 8.0*  --   --   --   HGB 15.3 15.3 14.0 13.8  HCT 50.0 45.0 46.0 44.7  MCV 84.9  --  86.6 86.3  PLT 167  --  219 193    Basic Metabolic Panel: Recent Labs  Lab 04/16/18 0739 04/16/18 0743 04/16/18 1053  04/17/18 1811  04/18/18 0610  04/18/18 1604 04/18/18 1700 04/18/18 2220 04/18/18 2349 04/19/18 0349 04/19/18 0557  NA 138  138  --  138   < > 150*   < > 151*   < > 150*  --  142 161* 162* 159*  K 5.3*  --  3.3*  --   --   --  5.0  --   --   --   --   --   --  3.0*  CL 100  --   --   --   --   --  121*  --   --   --   --   --   --  124*  CO2 22  --   --   --   --   --  24  --   --   --   --   --   --  25  GLUCOSE 119*  --   --   --   --   --  147*  --   --   --   --   --   --  142*  BUN 17  --   --   --   --   --  13  --   --   --   --   --   --  13  CREATININE 1.06 0.90  --   --   --   --  0.77  --   --   --   --   --   --  0.83  CALCIUM 9.7  --   --   --   --   --  8.6*  --   --   --   --   --   --  9.2  MG  --   --   --   --  2.5*  --  2.3  --   --  2.8*  --   --   --  2.2  PHOS  --   --   --   --  2.5  --  2.0*  --   --  2.1*  --   --   --  3.1   < > = values in this  interval not displayed.   GFR: Estimated Creatinine Clearance: 84.1 mL/min (by C-G formula based on SCr of 0.83 mg/dL). Recent Labs  Lab 04/16/18 0739 04/18/18 0409 04/19/18 0557  WBC 9.1 11.7* 12.5*    Liver Function Tests: Recent Labs  Lab 04/16/18 0739  AST 27  ALT 14  ALKPHOS 44  BILITOT 1.4*  PROT 8.6*  ALBUMIN 4.1   No results for input(s): LIPASE, AMYLASE in the last 168 hours. No results for input(s): AMMONIA in the last 168 hours.  ABG    Component Value Date/Time   PHART 7.376 04/16/2018 1053   PCO2ART 48.2 (H) 04/16/2018 1053   PO2ART 372.0 (H) 04/16/2018 1053   HCO3 28.3 (H) 04/16/2018 1053   TCO2 30 04/16/2018 1053   O2SAT 100.0 04/16/2018 1053     Coagulation Profile: Recent Labs  Lab 04/16/18 0739  INR 0.99    Cardiac Enzymes: No results for input(s): CKTOTAL, CKMB, CKMBINDEX, TROPONINI in the last 168 hours.  HbA1C: Hgb A1c MFr Bld  Date/Time Value Ref Range Status  06/01/2017 05:51 AM 5.5 4.8 - 5.6 % Final    Comment:    (NOTE) Pre diabetes:          5.7%-6.4% Diabetes:              >6.4% Glycemic control for   <7.0% adults with diabetes   11/12/2014 09:25 AM 6.1 (H) 4.8 - 5.6 % Final    Comment:    (NOTE)  Pre-diabetes: 5.7 - 6.4         Diabetes: >6.4         Glycemic control for adults with diabetes: <7.0     CBG: Recent Labs  Lab 04/18/18 1540 04/18/18 1934 04/18/18 2353 04/19/18 0334 04/19/18 0742  GLUCAP 98 110* 143* 99 121*   CT: decreasing IPH volume and associated edema and mass effect.   Assessment & Plan:   Critically ill due to acute respiratory failure requiring mechanical ventilation Critically ill due to acute encephalopathy with inability to protect airway. Critically ill due severe hypertension requiring titration of clevidipine to maintain SBP<160 to prevent rebleeding. Critically ill due to cerebral edema requiring titration of 3% NaCl to prevent cerebral herniation. Should be passed peak  swelling at this point. ICH secondary to amyloid angiopathy.  PLAN:  Continue full ventilatory support with daily SBT. Tolerating SBT but mental status currently precludes extubation. Continue to titrate clevidipine and add carvedilol for additional BP and HR control if  sinus tachycardia persists - verapamil restarted yesterday.  Add hydralazine for BP control. Continue to limit sedation to improve mental status for possible extubation.  Stop 3% NaCl as [Na] is at upper limit of therapeutic and patient likely passed peak edema. Allow slow correction. Start free water if rises beyond 160.   Best practice:  Diet:  PepUp protocol. Will place Cortrak in anticipation of swallowing dysfunction post extubation. Pain/Anxiety/Delirium protocol (if indicated): off propofol, intermittent fentanyl only. VAP protocol (if indicated):  Bundle in place. DVT prophylaxis: SCD, will start UFH tid as hematoma stable. GI prophylaxis: Pantoprazole Glucose control: control adequate with SSI  Mobility: bedrest Code Status: full  Family Communication:  Wife updated yesterday.  Disposition: ICU.   Critical care time:  35 min including chart data review, examination of patient and multidisciplinary rounds, assessment and modification of ventilation, sedation and anti-hypertensive therapy.    Lynnell Catalanavi Kenaz Olafson, MD [facility] ICU Physician [facility] [place] [facility]  Pager: [phone] Mobile: [phone] After hours: [phone].  04/19/2018, 9:06 AM

## 2018-04-19 NOTE — Progress Notes (Signed)
Nutrition Follow-up  DOCUMENTATION CODES:   Non-severe (moderate) malnutrition in context of chronic illness  INTERVENTION:   D/C Prostat  -Increase Vital High Protein @ 50 ml/hr (1200 ml) via Cortrak -Continue MVI daily   Provides: 1200 kcals (1776 kcal with cleviprex), 105 grams protein, 1003 ml free water. Meets 100% of needs.   NUTRITION DIAGNOSIS:   Moderate Malnutrition related to chronic illness(HTN) as evidenced by mild fat depletion, moderate fat depletion, mild muscle depletion, moderate muscle depletion.  Ongoing  GOAL:   Provide needs based on ASPEN/SCCM guidelines  Met  MONITOR:   TF tolerance, I & O's  REASON FOR ASSESSMENT:   Consult, Ventilator Enteral/tube feeding initiation and management  ASSESSMENT:   Pt with PMH of HTN, amyloid angiopathy, and prior ICH admitted 2/2 for large R IPH.    2/5- Post Pyloric Cortrak placed   Mental status remains barrier to extubation. Hypertonic saline discontinued. Pt tolerating Vital HP at 20 ml/hr. RD to increase rate with discontinuation of propofol. Will monitor for BM.   Weight stable at 70.3 kg. Needs remain the same.   Patient is currently intubated on ventilator support MV: 8.9 L/min Temp (24hrs), Avg:99.6 F (37.6 C), Min:99.1 F (37.3 C), Max:100.9 F (38.3 C) BP: 158/86 MAP: 104  Propofol: discontinued  Cleviprex: 12 ml/hr- provides 576 kcal   I/O: +2585 ml since admit UOP: 2750 ml x 24 hrs   Medications reviewed and include: SSI novolog, MVI with minerals, senokot, cleviprex Labs reviewed: Na 160 (H) K 3.0 (L)   Diet Order:   Diet Order            Diet NPO time specified  Diet effective now              EDUCATION NEEDS:   No education needs have been identified at this time  Skin:  Skin Assessment: Reviewed RN Assessment  Last BM:  PTA- bowel regimen in place  Height:   Ht Readings from Last 1 Encounters:  04/17/18 _0  (1.702 m)    Weight:   Wt Readings from Last  1 Encounters:  04/18/18 70.3 kg    Ideal Body Weight:  67.2 kg  BMI:  Body mass index is 24.27 kg/m.  Estimated Nutritional Needs:   Kcal:  2909  Protein:  90-105 grams  Fluid:  >1.7 L/day   Mariana Single RD, LDN Clinical Nutrition Pager # - [phone]

## 2018-04-19 NOTE — Progress Notes (Signed)
Patient transported to CT and back to 4N20 without complications.

## 2018-04-19 NOTE — Progress Notes (Signed)
OT Cancellation Note  Patient Details Name: Luis Johnson MRN: [id number] DOB: April 04, 1955   Cancelled Treatment:    Reason Eval/Treat Not Completed: Patient not medically ready, patient remains intubated. RN reports weaning sedation but patient easily agitated. Will continue to follow and attempt OT evaluation when appropriate.   Chancy Milroy, OT Acute Rehabilitation Services Pager [phone] Office [phone]    Chancy Milroy 04/19/2018, 11:03 AM

## 2018-04-19 NOTE — Progress Notes (Signed)
STROKE TEAM PROGRESS NOTE   INTERVAL HISTORY His wife is at the bedside.  Marland KitchenHis condition remains unchanged. BP adequately controlled. Serum Na 160 now off hypertonic saline. Temp ok.  CT scan of the head this morning shows reduction of the hemorrhage as well as cytotoxic edema and midline shift from 7 to 5 mm now  Vitals:   04/19/18 1200 04/19/18 1319 04/19/18 1554 04/19/18 1600  BP: 131/87 (!) 144/88 (!) 142/89   Pulse: (!) 117  (!) 130   Resp: 19  20   Temp: 99.6 F (37.6 C)   (!) 101.9 F (38.8 C)  TempSrc: Axillary   Axillary  SpO2: 94%  95%   Weight:      Height:        CBC:  Recent Labs  Lab 04/16/18 0739  04/18/18 0409 04/19/18 0557  WBC 9.1  --  11.7* 12.5*  NEUTROABS 8.0*  --   --   --   HGB 15.3   < > 14.0 13.8  HCT 50.0   < > 46.0 44.7  MCV 84.9  --  86.6 86.3  PLT 167  --  219 193   < > = values in this interval not displayed.    Basic Metabolic Panel:  Recent Labs  Lab 04/18/18 0610  04/18/18 1700  04/19/18 0557 04/19/18 1004  NA 151*   < >  --    < > 159* 160*  K 5.0  --   --   --  3.0*  --   CL 121*  --   --   --  124*  --   CO2 24  --   --   --  25  --   GLUCOSE 147*  --   --   --  142*  --   BUN 13  --   --   --  13  --   CREATININE 0.77  --   --   --  0.83  --   CALCIUM 8.6*  --   --   --  9.2  --   MG 2.3  --  2.8*  --  2.2  --   PHOS 2.0*  --  2.1*  --  3.1  --    < > = values in this interval not displayed.   Lipid Panel:     Component Value Date/Time   CHOL 169 06/01/2017 0551   TRIG 194 (H) 04/18/2018 0610   HDL 47 06/01/2017 0551   CHOLHDL 3.6 06/01/2017 0551   VLDL 19 06/01/2017 0551   LDLCALC 103 (H) 06/01/2017 0551   HgbA1c:  Lab Results  Component Value Date   HGBA1C 5.5 06/01/2017   Urine Drug Screen:     Component Value Date/Time   LABOPIA NONE DETECTED 12/21/2017 2014   COCAINSCRNUR NONE DETECTED 12/21/2017 2014   LABBENZ NONE DETECTED 12/21/2017 2014   AMPHETMU NONE DETECTED 12/21/2017 2014   THCU NONE  DETECTED 12/21/2017 2014   LABBARB NONE DETECTED 12/21/2017 2014    Alcohol Level     Component Value Date/Time   ETH <10 12/21/2017 1644    IMAGING Ct Head Wo Contrast  Result Date: 04/19/2018 CLINICAL DATA:  Follow-up examination for intracranial hemorrhage EXAM: CT HEAD WITHOUT CONTRAST TECHNIQUE: Contiguous axial images were obtained from the base of the skull through the vertex without intravenous contrast. COMPARISON:  Prior CT from 04/16/2018 FINDINGS: Brain: Intraparenchymal hemorrhage centered at the right parieto-occipital region is slightly decreased in size now  measuring 4.8 x 5.4 x 7.4 cm (estimated volume 96 cc, previously 123 cc). Surrounding vasogenic edema slightly improved. Persistent but improved right-to-left midline shift now measuring up to 5 mm, previously 7 mm. Improved effacement of the right lateral ventricle. No hydrocephalus or ventricular trapping. Basilar cisterns remain patent. No intraventricular extension of hemorrhage. Trace extra-axial hemorrhage overlying the right cerebral convexity grossly similar to previous MRI. Otherwise, appearance of the brain is stable. No interval large vessel territory infarct or other acute finding. Vascular: No hyperdense vessel. Calcified atherosclerosis at the skull base. Skull: Scalp soft tissues and calvarium demonstrate no acute finding. Sinuses/Orbits: Globes and orbital soft tissues within normal limits. Paranasal sinuses largely clear. No mastoid effusion. Other: None. IMPRESSION: 1. Interval decrease in size of right temporal occipital intraparenchymal hemorrhage, estimated volume 96 cc on today's exam, previously 123 cc on 04/16/2018. 2. Slightly improved associated edema and regional mass effect. Right-to-left midline shift now measures 5 mm, previously 7 mm. 3. No other new acute intracranial abnormality. Electronically Signed   By: Rise Mu M.D.   On: 04/19/2018 04:25   [name] Head Wo Contrast  Result Date:  04/18/2018 CLINICAL DATA:  Headache, follow-up hemorrhage. History of hypertension, amyloid angiopathy and intracranial hemorrhage. EXAM: MRI HEAD WITHOUT CONTRAST MRA HEAD WITHOUT CONTRAST TECHNIQUE: Multiplanar, multiecho pulse sequences of the brain and surrounding structures were obtained without intravenous contrast. Angiographic images of the head were obtained using MRA technique without contrast. COMPARISON:  CT HEAD April 16, 2018 and MRI head December 21, 2017 and MRA head May 31, 2017. FINDINGS: MRI HEAD FINDINGS INTRACRANIAL CONTENTS: Spurs reduced diffusion with susceptibility artifact corresponding to known RIGHT temporal occipital intraparenchymal hematoma, patchy T1 shortening. Surrounding vasogenic edema with 5 mm RIGHT to LEFT midline shift. Partially effaced RIGHT lateral ventricle without LEFT ventricular entrapment. Ex vacuo dilatation LEFT occipital horn. Old LEFT occipital lobe hemorrhage and encephalomalacia. 1 cm reduced diffusion and low ADC values RIGHT frontal lobe. Extensive supra and infratentorial chronic microhemorrhages with posterior predominance. Patchy supratentorial white matter FLAIR T2 hyperintensities. Prominent perivascular spaces with juxta cortical T2 hyperintense signal, unchanged. VASCULAR: Normal major intracranial vascular flow voids present at skull base. SKULL AND UPPER CERVICAL SPINE: No abnormal sellar expansion. No suspicious calvarial bone marrow signal. Craniocervical junction maintained. SINUSES/ORBITS: The mastoid air-cells and included paranasal sinuses are well-aerated.The included ocular globes and orbital contents are non-suspicious. OTHER: Life support lines in place. MRA HEAD FINDINGS ANTERIOR CIRCULATION: Normal flow related enhancement of the included cervical, petrous, cavernous and supraclinoid internal carotid arteries. Patent anterior communicating artery. Patent anterior and middle cerebral arteries. Mild stenosis RIGHT M 1 origin. 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 No large vessel occlusion, flow limiting stenosis. POSTERIOR CIRCULATION: Codominant vertebral arteries. Vertebrobasilar arteries are patent, with normal flow related enhancement of the main branch vessels. Patent posterior cerebral arteries. New mild stenosis LEFT P 2 segment. No large vessel occlusion, flow limiting stenosis,  aneurysm. ANATOMIC VARIANTS: None. Source images and MIP images were reviewed. IMPRESSION: MRI HEAD: 1. Acute to subacute large RIGHT temporal occipital hematoma with 5 mm RIGHT-to-LEFT midline shift. No ventricular entrapment. 2. Extensive chronic microhemorrhages in advanced chronic small vessel ischemic changes seen with amyloid angiopathy/angitis. 3. Old LEFT occipital hemorrhage. MRA HEAD: 1. No emergent large vessel occlusion or flow-limiting stenosis. Mild stenosis RIGHT M1 and LEFT P2 segment. 2. 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 Electronically Signed   By: Awilda Metro M.D.   On: 04/18/2018 02:38  Mr Brain Wo Contrast  Result Date: 04/18/2018 CLINICAL DATA:  Headache, follow-up hemorrhage. History of hypertension, amyloid angiopathy and intracranial hemorrhage. EXAM: MRI HEAD WITHOUT CONTRAST MRA HEAD WITHOUT CONTRAST TECHNIQUE: Multiplanar, multiecho pulse sequences of the brain and surrounding structures were obtained without intravenous contrast. Angiographic images of the head were obtained using MRA technique without contrast. COMPARISON:  CT HEAD April 16, 2018 and MRI head December 21, 2017 and MRA head May 31, 2017. FINDINGS: MRI HEAD FINDINGS INTRACRANIAL CONTENTS: Spurs reduced diffusion with susceptibility artifact corresponding to known RIGHT temporal occipital intraparenchymal hematoma, patchy T1 shortening. Surrounding vasogenic edema with 5 mm RIGHT to LEFT midline shift. Partially effaced RIGHT lateral ventricle without LEFT ventricular entrapment. Ex vacuo dilatation LEFT occipital horn. Old LEFT occipital  lobe hemorrhage and encephalomalacia. 1 cm reduced diffusion and low ADC values RIGHT frontal lobe. Extensive supra and infratentorial chronic microhemorrhages with posterior predominance. Patchy supratentorial white matter FLAIR T2 hyperintensities. Prominent perivascular spaces with juxta cortical T2 hyperintense signal, unchanged. VASCULAR: Normal major intracranial vascular flow voids present at skull base. SKULL AND UPPER CERVICAL SPINE: No abnormal sellar expansion. No suspicious calvarial bone marrow signal. Craniocervical junction maintained. SINUSES/ORBITS: The mastoid air-cells and included paranasal sinuses are well-aerated.The included ocular globes and orbital contents are non-suspicious. OTHER: Life support lines in place. MRA HEAD FINDINGS ANTERIOR CIRCULATION: Normal flow related enhancement of the included cervical, petrous, cavernous and supraclinoid internal carotid arteries. Patent anterior communicating artery. Patent anterior and middle cerebral arteries. Mild stenosis RIGHT M 1 origin. 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 No large vessel occlusion, flow limiting stenosis. POSTERIOR CIRCULATION: Codominant vertebral arteries. Vertebrobasilar arteries are patent, with normal flow related enhancement of the main branch vessels. Patent posterior cerebral arteries. New mild stenosis LEFT P 2 segment. No large vessel occlusion, flow limiting stenosis,  aneurysm. ANATOMIC VARIANTS: None. Source images and MIP images were reviewed. IMPRESSION: MRI HEAD: 1. Acute to subacute large RIGHT temporal occipital hematoma with 5 mm RIGHT-to-LEFT midline shift. No ventricular entrapment. 2. Extensive chronic microhemorrhages in advanced chronic small vessel ischemic changes seen with amyloid angiopathy/angitis. 3. Old LEFT occipital hemorrhage. MRA HEAD: 1. No emergent large vessel occlusion or flow-limiting stenosis. Mild stenosis RIGHT M1 and LEFT P2 segment. 2. Stable intact 3 mm RIGHT MCA bifurcation  aneurysm. Electronically Signed   By: Awilda Metroourtnay  Bloomer M.D.   On: 04/18/2018 02:38   Portable Chest Xray  Result Date: 04/18/2018 CLINICAL DATA:  Respiratory failure EXAM: PORTABLE CHEST 1 VIEW COMPARISON:  04/16/2018 FINDINGS: Cardiac shadow is stable. Endotracheal tube, right jugular central line and gastric catheter are noted in satisfactory position. Lungs are well aerated bilaterally. No focal infiltrate or sizable effusion is seen. Left shoulder replacement is noted. IMPRESSION: Tubes and lines as described. No acute abnormality noted. Electronically Signed   By: Alcide CleverMark  Lukens M.D.   On: 04/18/2018 08:22    PHYSICAL EXAM  middle-aged African-American gentleman who is sedated and intubated. . Afebrile. Head is nontraumatic. Neck is supple without bruit.    Cardiac exam no murmur or gallop. Lungs are clear to auscultation. Distal pulses are well felt. Neurological Exam ;  sedated and intubated.eyes are closed. Right gaze preference and not able to look to the left even with reflex eye movements. Pupils are 3 mm irregular but reactive. Corneal reflexes are present. Fundi not visualized. Tongue midline. Left lower facial weakness. Flaccid left hemiplegia with no carotid painful stimuli. Purposeful antigravity movement on the right side. Follows few commands intermittently in the  midline and in the right hand. Tone is normal on the right and diminished on the left. Right plantar equivocal and left is upgoing.  ASSESSMENT/PLAN Mr. Afton Mikelson is a 63 y.o. 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   Stroke:   Large right temporoparietal hemorrhage in patient with known cerebral amyloid  Code Stroke CT head 2/2 0719 large R hemisphere multilobar ICH, vol 112 mL with significant right to left shift 8 mm and uncal herniation.  Likely amyloid.  CT head 2/2 1302 stable large right ICH, vol 123 mL.  Stable right to left shift 7 mm.  Likely amyloid.  MRI  Large right temporal occipital hematoma with 5 mm right-to-left shift no hydrocephalus. Extensive chronic microhemorrhages. Old left occipital hemorrhage.  MRA  No large vessel stenosis or occlusion. 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  2D Echo left ventricle has hyperdynamic systolic function of >65%. The cavity size is normal. There is no left ventricular wall thickness. Echo evidence of impaired relaxation diastolic filling patterns  CT angiogram neck 12/21/2017 Mod L PCA stenosis. 3x4 R MCA aneurysm  LDL 103  HgbA1c 5.5  SCDs for VTE prophylaxis  aspirin 81 mg daily prior to admission, now on No antithrombotic given hemorrhage  Therapy recommendations: Pending  Disposition: Pending  Overall prognosis is poor.  Dr. Pearlean Brownie discussed with her diagnosis and prognosis.  Wife wants full aggressive care.   Acute respiratory failure   Intubated  CCM consulted to manage vent and medical care  Cytotoxic cerebral edema  Induced hypernatremia  CT shows 7 to 8 mm shift and uncal herniation  On 3% protocol to decrease cerebral edema  Sodium currently 148  Goal sodium 150-155  Check sodium every 6 hours  Dysphagia   Secondary to ICH   N.p.o.   Febrile  likely secondary to ICH TM 100.6  Tylenol PRN  Cooling blanket  CCM for TTM if appropriate   Hypertensive emergency  Currently on Cleviprex   Systolic blood pressure goal less than 140   Hyperlipidemia  Home meds: Pravachol 80  LDL 103  Hold statin given ICH  Consider resumption of statin at discharge  Other Stroke Risk Factors  Former cigarette smoker, quit 24 years ago   Hx stroke/TIA  06/03/2017 R occipital SAH incidental, not related to presenting symptoms  10/2014 left occipital hemorrhage secondary to hypertension versus CAA.  Chronic right occipital ICH.  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 Etiology probably amyloid angiopathy. His prognosis of surviving this and making meaningful recovery and living independently is quite slim. I explained this to the patient's wife but she wants full support and aggressive medical care. Recommend strict blood pressure control with systolic blood pressure goal below  160. Keep normothermic and euglycemic. Stop Hypertonic saline added serum sodium gradually drifted down to normal. Patient may need tracheostomy if he cannot be weaned off ventilatory support over the next few days.Long discussion with wife at the bedside and with Dr. Denese Killings critical care medicine.This patient is critically ill and at significant risk of neurological worsening, death and care requires constant monitoring of vital signs, hemodynamics,respiratory and cardiac monitoring, extensive review of multiple databases, frequent neurological assessment, discussion with family, other  specialists and medical decision making of high complexity.I have made any additions or clarifications directly to the above note.This critical care time does not reflect procedure time, or teaching time or supervisory time of PA/NP/Med Resident etc but could involve care discussion time.  I spent 35 minutes of neurocritical care time  in the care of  this patient.      Delia HeadyPramod Sethi, MD Medical Director [facility] Pager: [phone] 04/19/2018 4:27 PM  To contact Stroke Continuity provider, please refer to [email or web address]. After hours, contact General Neurology

## 2018-04-19 NOTE — Progress Notes (Signed)
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  He is 0% service connected, and is seen for primary care at [facility].  His PCP is Dr. Anson Fret at [facility].    For discharge needs, will need to coordinate with [facility]:  Pager:  [phone] Phone:  [phone], ext [phone]  Quintella Baton, RN, BSN  Trauma/Neuro ICU Case Manager [phone]

## 2018-04-19 NOTE — Progress Notes (Signed)
PT Cancellation Note  Patient Details Name: Luis Johnson MRN: [id number] DOB: 20-Dec-1955   Cancelled Treatment:    Reason Eval/Treat Not Completed: Patient not medically ready; noted per MD note pt remains on bedrest and unresponsive and not for extubation today due to agitation.  Will attempt another day.   Elray Mcgregor 04/19/2018, 10:10 AM Sheran Lawless, PT Acute Rehabilitation Services [phone] 04/19/2018

## 2018-04-19 NOTE — Progress Notes (Signed)
SLP Cancellation Note  Patient Details Name: Delaney Partsch MRN: [id number] DOB: 07/07/1955   Cancelled treatment:       Reason Eval/Treat Not Completed: Medical issues which prohibited therapy;Patient not medically ready. Pt remains intubated at this time.    Scheryl Marten 04/19/2018, 8:25 AM

## 2018-04-20 LAB — CBC
HEMATOCRIT: 39.1 % (ref 39.0–52.0)
Hemoglobin: 11.8 g/dL — ABNORMAL LOW (ref 13.0–17.0)
MCH: 25.9 pg — AB (ref 26.0–34.0)
MCHC: 30.2 g/dL (ref 30.0–36.0)
MCV: 85.9 fL (ref 80.0–100.0)
Platelets: 107 10*3/uL — ABNORMAL LOW (ref 150–400)
RBC: 4.55 MIL/uL (ref 4.22–5.81)
RDW: 16.5 % — ABNORMAL HIGH (ref 11.5–15.5)
WBC: 11.3 10*3/uL — ABNORMAL HIGH (ref 4.0–10.5)
nRBC: 0 % (ref 0.0–0.2)

## 2018-04-20 LAB — GLUCOSE, CAPILLARY
Glucose-Capillary: 115 mg/dL — ABNORMAL HIGH (ref 70–99)
Glucose-Capillary: 134 mg/dL — ABNORMAL HIGH (ref 70–99)
Glucose-Capillary: 135 mg/dL — ABNORMAL HIGH (ref 70–99)
Glucose-Capillary: 146 mg/dL — ABNORMAL HIGH (ref 70–99)
Glucose-Capillary: 94 mg/dL (ref 70–99)
Glucose-Capillary: 98 mg/dL (ref 70–99)

## 2018-04-20 LAB — SODIUM
SODIUM: 159 mmol/L — AB (ref 135–145)
Sodium: 160 mmol/L — ABNORMAL HIGH (ref 135–145)
Sodium: 148 mmol/L — ABNORMAL HIGH (ref 135–145)
Sodium: 158 mmol/L — ABNORMAL HIGH (ref 135–145)

## 2018-04-20 LAB — BASIC METABOLIC PANEL
Anion gap: 13 (ref 5–15)
BUN: 56 mg/dL — ABNORMAL HIGH (ref 8–23)
CHLORIDE: 122 mmol/L — AB (ref 98–111)
CO2: 24 mmol/L (ref 22–32)
Calcium: 8.5 mg/dL — ABNORMAL LOW (ref 8.9–10.3)
Creatinine, Ser: 2.81 mg/dL — ABNORMAL HIGH (ref 0.61–1.24)
GFR calc Af Amer: 26 mL/min — ABNORMAL LOW (ref >60)
GFR calc non Af Amer: 23 mL/min — ABNORMAL LOW (ref >60)
Glucose, Bld: 109 mg/dL — ABNORMAL HIGH (ref 70–99)
Potassium: 3.8 mmol/L (ref 3.5–5.1)
Sodium: 159 mmol/L — ABNORMAL HIGH (ref 135–145)

## 2018-04-20 MED ORDER — SODIUM CHLORIDE 0.9 % IV SOLN: INTRAVENOUS | Status: DC

## 2018-04-20 MED ORDER — DEXMEDETOMIDINE HCL IN NACL 200 MCG/50ML IV SOLN
0.4000 ug/kg/h | INTRAVENOUS | Status: DC
  Administered 2018-04-20: 0.5 ug/kg/h via INTRAVENOUS
  Administered 2018-04-20: 0.4 ug/kg/h via INTRAVENOUS
  Filled 2018-04-20 (×2): qty 50

## 2018-04-20 MED ORDER — SODIUM CHLORIDE 0.9 % IV BOLUS
1000.0000 mL | Freq: Once | INTRAVENOUS | Status: AC
  Administered 2018-04-20: 1000 mL via INTRAVENOUS

## 2018-04-20 NOTE — Progress Notes (Addendum)
NAME:  Luis Johnson, MRN:  [id number], DOB:  [birth year], LOS: 4 ADMISSION DATE:  04/16/2018, CONSULTATION DATE:  04/17/2018 REFERRING MD:  Cherly BeachSethi - [facility], CHIEF COMPLAINT:  AMS   HPI/course in hospital  Luis Polesony Tucciarone is a [age] y.o. male with amyloid angiopathy who was admitted 2/2 with ICH and required intubation for airway protection.  Past Medical History   Past Medical History:  Diagnosis Date  . Hypertension   . Stroke [facility](HCC)      Past Surgical History:  Procedure Laterality Date  . ARTERY BIOPSY Bilateral 06/03/2017   Procedure: BILATERAL TEMPORAL ARTERY BIOPSY;  Surgeon: Fransisco Hertzhen, Brian L, MD;  [facility];  Service: Vascular;  Laterality: Bilateral;  . IR 3D INDEPENDENT WKST  06/02/2017  . IR ANGIO INTRA EXTRACRAN SEL COM CAROTID INNOMINATE BILAT MOD SED  06/02/2017  . IR ANGIO VERTEBRAL SEL VERTEBRAL BILAT MOD SED  06/02/2017  . SHOULDER SURGERY      Interim history/subjective:  Of sedation with some spontaneous movement of his R side.   Objective   Blood pressure (!) 76/54, pulse 97, temperature (!) 100.6 F (38.1 C), temperature source Axillary, resp. rate (!) 22, height 5\' 7"  (1.702 m), weight 71 kg, SpO2 97 %.    Vent Mode: PRVC FiO2 (%):  [30 %-40 %] 30 % Set Rate:  [16 bmp] 16 bmp Vt Set:  [470 mL] 470 mL PEEP:  [5 cmH20] 5 cmH20 Pressure Support:  [5 cmH20] 5 cmH20 Plateau Pressure:  [10 cmH20-15 cmH20] 14 cmH20   Intake/Output Summary (Last 24 hours) at 04/20/2018 1108 Last data filed at 04/20/2018 1000 Gross per 24 hour  Intake 1793.47 ml  Output 950 ml  Net 843.47 ml   Filed Weights   04/16/18 0741 04/18/18 0500 04/20/18 0444  Weight: 70.5 kg 70.3 kg 71 kg    Examination: General:  Adult male on vent Neuro:  Unresponsive. Seems to try to open eyes to his name, RUE flexes spontaneously, but not to command.  HEENT:  [place]/AT, No JVD noted, PERRL Cardiovascular:  RRR, no MRG Lungs:  Clear bilateral breath sounds Abdomen:  Soft, non-distended Musculoskeletal:  No  acute deformity Skin:  Intact, MMM   Ancillary tests   CBC: Recent Labs  Lab 04/16/18 0739 04/16/18 1053 04/18/18 0409 04/19/18 0557  WBC 9.1  --  11.7* 12.5*  NEUTROABS 8.0*  --   --   --   HGB 15.3 15.3 14.0 13.8  HCT 50.0 45.0 46.0 44.7  MCV 84.9  --  86.6 86.3  PLT 167  --  219 193    Basic Metabolic Panel: Recent Labs  Lab 04/16/18 0739 04/16/18 0743 04/16/18 1053  04/17/18 1811  04/18/18 0610  04/18/18 1700  04/19/18 0557 04/19/18 1004 04/19/18 2210 04/20/18 0432 04/20/18 1000  NA 138  138  --  138   < > 150*   < > 151*   < >  --    < > 159* 160* 160* 160* 148*  K 5.3*  --  3.3*  --   --   --  5.0  --   --   --  3.0*  --   --   --   --   CL 100  --   --   --   --   --  121*  --   --   --  124*  --   --   --   --   CO2 22  --   --   --   --   --  24  --   --   --  25  --   --   --   --   GLUCOSE 119*  --   --   --   --   --  147*  --   --   --  142*  --   --   --   --   BUN 17  --   --   --   --   --  13  --   --   --  13  --   --   --   --   CREATININE 1.06 0.90  --   --   --   --  0.77  --   --   --  0.83  --   --   --   --   CALCIUM 9.7  --   --   --   --   --  8.6*  --   --   --  9.2  --   --   --   --   MG  --   --   --   --  2.5*  --  2.3  --  2.8*  --  2.2  --   --   --   --   PHOS  --   --   --   --  2.5  --  2.0*  --  2.1*  --  3.1  --   --   --   --    < > = values in this interval not displayed.   GFR: Estimated Creatinine Clearance: 84.1 mL/min (by C-G formula based on SCr of 0.83 mg/dL). Recent Labs  Lab 04/16/18 0739 04/18/18 0409 04/19/18 0557  WBC 9.1 11.7* 12.5*    Liver Function Tests: Recent Labs  Lab 04/16/18 0739  AST 27  ALT 14  ALKPHOS 44  BILITOT 1.4*  PROT 8.6*  ALBUMIN 4.1   No results for input(s): LIPASE, AMYLASE in the last 168 hours. No results for input(s): AMMONIA in the last 168 hours.  ABG    Component Value Date/Time   PHART 7.376 04/16/2018 1053   PCO2ART 48.2 (H) 04/16/2018 1053   PO2ART 372.0 (H)  04/16/2018 1053   HCO3 28.3 (H) 04/16/2018 1053   TCO2 30 04/16/2018 1053   O2SAT 100.0 04/16/2018 1053     Coagulation Profile: Recent Labs  Lab 04/16/18 0739  INR 0.99    Cardiac Enzymes: No results for input(s): CKTOTAL, CKMB, CKMBINDEX, TROPONINI in the last 168 hours.  HbA1C: Hgb A1c MFr Bld  Date/Time Value Ref Range Status  06/01/2017 05:51 AM 5.5 4.8 - 5.6 % Final    Comment:    (NOTE) Pre diabetes:          5.7%-6.4% Diabetes:              >6.4% Glycemic control for   <7.0% adults with diabetes   11/12/2014 09:25 AM 6.1 (H) 4.8 - 5.6 % Final    Comment:    (NOTE)         Pre-diabetes: 5.7 - 6.4         Diabetes: >6.4         Glycemic control for adults with diabetes: <7.0     CBG: Recent Labs  Lab 04/19/18 1548 04/19/18 1930 04/19/18 2322 04/20/18 0320 04/20/18 0748  GLUCAP 99 110* 110* 134* 135*    Assessment & Plan:   ICH secondary to  amyloid angiopathy: Complicated by cerebral edema. S/p 3% NaCl infusion stopped 2/5. - Stroke service following - Off hypertonic saline, allowing Na to drift down.  - Prognosis for a meaningful recovery is poor  Acute respiratory failure requiring mechanical ventilation - Continue full vent support with daily SBT - Weaning well, however, mental status prevents extubation.  - Limit sedation - Doubt he will arouse to the level needed for extubation, consider early trach if it is in line with family wishes.   Hypertensive crisis: Clevidipine now off - SBP goal < 160 mmHg - Continue hydralazine, lisinopril, verapamil.  - PRN hydralazine   Best practice:  Diet:  TF via cortrak Pain/Anxiety/Delirium protocol (if indicated): RASS goal 0. PRN fent VAP protocol (if indicated):  Bundle in place. DVT prophylaxis: SCD GI prophylaxis: Pantoprazole Glucose control: control adequate with SSI  Mobility: bedrest Code Status: full  Family Communication:  Wife updated at length. Understands his prognosis is poor, but  not yet willing to make decisions regarding goals or code status. FULL code. I have informed her that if he does not improve by Monday or Tuesday we will need to address this more seriously.  Disposition: ICU.   Critical care time:  35 mins    Joneen RoachPaul Hoffman, AGACNP-BC [facility]/Critical Care Pager [phone] or [phone]  04/20/2018 11:20 AM   Critical care attending attestation note:  Patient seen and examined and relevant ancillary tests reviewed.  I agree with the assessment and plan of care as outlined by P. Mikey BussingHoffman, NP. This patient was not seen as a shared visit. The following reflects my independent critical care time.  63 year old man who is critically ill due to ventilator dependent respiratory failure due to inability to protect his airway following an intraparenchymal hemorrhage secondary to amyloid angiopathy.  Today he is more awake and is been able to follow commands on his right side.  He expands to pain on the left with movement on the right consistent with neglect.  He has right gaze deviation. He has tolerated pressure support weaning all day.  And his chest is clear.  He is making progress from a neurological standpoint and should be ready for a trial of extubation over the next couple of days.  Total critical care time: 40 minutes including chart review, examination of the patient, participation in multidisciplinary rounds, evaluation modifications and sedative and ventilatory therapy.  Lynnell Catalanavi Deaira Leckey, MD Staff Critical Care Physician  Critical Care Group Pager: [phone] Off hours: [phone]  04/20/2018, 4:36 PM

## 2018-04-20 NOTE — Progress Notes (Signed)
Patient is double stacking on the vent with tachycardia. E-Link MD notified, awaiting orders for a Precedex gtt. Will continue to monitor.

## 2018-04-20 NOTE — Progress Notes (Signed)
PT Cancellation Note  Patient Details Name: Luis Johnson MRN: [id number] DOB: 01-28-1956   Cancelled Treatment:    Reason Eval/Treat Not Completed: Patient not medically ready(pt remains on vent with dyssynchrony and bedrest)   Bain Whichard B Jayde Mcallister 04/20/2018, 6:59 AM  [name], PT Acute Rehabilitation Services Pager: [phone] Office: [phone]

## 2018-04-20 NOTE — Progress Notes (Signed)
OT Cancellation Note  Patient Details Name: Luis Johnson MRN: [id number] DOB: [birth year]   Cancelled Treatment:    Reason Eval/Treat Not Completed: Patient not medically ready; (pt remains on vent and with active bedrest orders)  Marcy Siren, OT Supplemental Rehabilitation Services Pager [phone] Office [phone]   Orlando Penner 04/20/2018, 7:51 AM

## 2018-04-20 NOTE — Progress Notes (Signed)
STROKE TEAM PROGRESS NOTE   INTERVAL HISTORY His wife is at the bedside.  Marland Kitchen.His condition remains unchanged. BP adequately controlled. Serum Na 148 now off hypertonic saline. Temp ok.   patient is not yet being weaned off ventilatory support.  Vitals:   04/20/18 1400 04/20/18 1500 04/20/18 1522 04/20/18 1600  BP: 126/75 108/74 112/79 136/83  Pulse: 100 99 99 (!) 104  Resp: 14 16  15   Temp:      TempSrc:      SpO2: 98% 96% 97% 98%  Weight:      Height:        CBC:  Recent Labs  Lab 04/16/18 0739  04/19/18 0557 04/20/18 1447  WBC 9.1   < > 12.5* 11.3*  NEUTROABS 8.0*  --   --   --   HGB 15.3   < > 13.8 11.8*  HCT 50.0   < > 44.7 39.1  MCV 84.9   < > 86.3 85.9  PLT 167   < > 193 107*   < > = values in this interval not displayed.    Basic Metabolic Panel:  Recent Labs  Lab 04/18/18 0610  04/18/18 1700  04/19/18 0557  04/20/18 0432 04/20/18 1000  NA 151*   < >  --    < > 159*   < > 160* 148*  K 5.0  --   --   --  3.0*  --   --   --   CL 121*  --   --   --  124*  --   --   --   CO2 24  --   --   --  25  --   --   --   GLUCOSE 147*  --   --   --  142*  --   --   --   BUN 13  --   --   --  13  --   --   --   CREATININE 0.77  --   --   --  0.83  --   --   --   CALCIUM 8.6*  --   --   --  9.2  --   --   --   MG 2.3  --  2.8*  --  2.2  --   --   --   PHOS 2.0*  --  2.1*  --  3.1  --   --   --    < > = values in this interval not displayed.   Lipid Panel:     Component Value Date/Time   CHOL 169 06/01/2017 0551   TRIG 194 (H) 04/18/2018 0610   HDL 47 06/01/2017 0551   CHOLHDL 3.6 06/01/2017 0551   VLDL 19 06/01/2017 0551   LDLCALC 103 (H) 06/01/2017 0551   HgbA1c:  Lab Results  Component Value Date   HGBA1C 5.5 06/01/2017   Urine Drug Screen:     Component Value Date/Time   LABOPIA NONE DETECTED 12/21/2017 2014   COCAINSCRNUR NONE DETECTED 12/21/2017 2014   LABBENZ NONE DETECTED 12/21/2017 2014   AMPHETMU NONE DETECTED 12/21/2017 2014   THCU NONE  DETECTED 12/21/2017 2014   LABBARB NONE DETECTED 12/21/2017 2014    Alcohol Level     Component Value Date/Time   ETH <10 12/21/2017 1644    IMAGING Ct Head Wo Contrast  Result Date: 04/19/2018 CLINICAL DATA:  Follow-up examination for intracranial hemorrhage EXAM: CT HEAD WITHOUT CONTRAST TECHNIQUE:  Contiguous axial images were obtained from the base of the skull through the vertex without intravenous contrast. COMPARISON:  Prior CT from 04/16/2018 FINDINGS: Brain: Intraparenchymal hemorrhage centered at the right parieto-occipital region is slightly decreased in size now measuring 4.8 x 5.4 x 7.4 cm (estimated volume 96 cc, previously 123 cc). Surrounding vasogenic edema slightly improved. Persistent but improved right-to-left midline shift now measuring up to 5 mm, previously 7 mm. Improved effacement of the right lateral ventricle. No hydrocephalus or ventricular trapping. Basilar cisterns remain patent. No intraventricular extension of hemorrhage. Trace extra-axial hemorrhage overlying the right cerebral convexity grossly similar to previous MRI. Otherwise, appearance of the brain is stable. No interval large vessel territory infarct or other acute finding. Vascular: No hyperdense vessel. Calcified atherosclerosis at the skull base. Skull: Scalp soft tissues and calvarium demonstrate no acute finding. Sinuses/Orbits: Globes and orbital soft tissues within normal limits. Paranasal sinuses largely clear. No mastoid effusion. Other: None. IMPRESSION: 1. Interval decrease in size of right temporal occipital intraparenchymal hemorrhage, estimated volume 96 cc on today's exam, previously 123 cc on 04/16/2018. 2. Slightly improved associated edema and regional mass effect. Right-to-left midline shift now measures 5 mm, previously 7 mm. 3. No other new acute intracranial abnormality. Electronically Signed   By: Rise Mu M.D.   On: 04/19/2018 04:25    PHYSICAL EXAM  middle-aged  African-American gentleman who is sedated and intubated. . Afebrile. Head is nontraumatic. Neck is supple without bruit.    Cardiac exam no murmur or gallop. Lungs are clear to auscultation. Distal pulses are well felt. Neurological Exam ;  sedated and intubated.eyes are closed. Right gaze preference and not able to look to the left even with reflex eye movements. Pupils are 3 mm irregular but reactive. Corneal reflexes are present. Fundi not visualized. Tongue midline. Left lower facial weakness. Flaccid left hemiplegia with no carotid painful stimuli. Purposeful antigravity movement on the right side. Follows few commands intermittently in the midline and in the right hand. Tone is normal on the right and diminished on the left. Right plantar equivocal and left is upgoing.  ASSESSMENT/PLAN Luis Johnson is a 63 y.o. 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   Stroke:   Large right temporoparietal hemorrhage in patient with known cerebral amyloid  Code Stroke CT head 2/2 0719 large R hemisphere multilobar ICH, vol 112 mL with significant right to left shift 8 mm and uncal herniation.  Likely amyloid.  CT head 2/2 1302 stable large right ICH, vol 123 mL. Stable right to left shift 7 mm.  Likely amyloid.  MRI  Large right temporal occipital hematoma with 5 mm right-to-left shift no hydrocephalus. Extensive chronic microhemorrhages. Old left occipital hemorrhage.  MRA  No large vessel stenosis or occlusion. 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  2D Echo left ventricle has hyperdynamic systolic function of >65%. The cavity size is normal. There is no left ventricular wall thickness. Echo evidence of impaired relaxation diastolic filling patterns  CT angiogram neck 12/21/2017 Mod L PCA stenosis. 3x4 R MCA aneurysm  LDL 103  HgbA1c 5.5  SCDs for VTE prophylaxis  aspirin 81 mg daily  prior to admission, now on No antithrombotic given hemorrhage  Therapy recommendations: Pending  Disposition: Pending  Overall prognosis is poor.  Dr. Pearlean Brownie discussed with her diagnosis and prognosis.  Wife wants full aggressive care.   Acute respiratory failure   Intubated  CCM consulted to manage vent and medical care  Cytotoxic cerebral edema  Induced hypernatremia  CT shows 7 to 8 mm shift and uncal herniation  On 3% protocol to decrease cerebral edema  Sodium currently 148  Goal sodium 150-155  Check sodium every 6 hours  Dysphagia   Secondary to ICH   N.p.o.   Febrile  likely secondary to ICH TM 100.6  Tylenol PRN  Cooling blanket  CCM for TTM if appropriate   Hypertensive emergency  Currently on Cleviprex   Systolic blood pressure goal less than 140   Hyperlipidemia  Home meds: Pravachol 80  LDL 103  Hold statin given ICH  Consider resumption of statin at discharge  Other Stroke Risk Factors  Former cigarette smoker, quit 24 years ago   Hx stroke/TIA  06/03/2017 R occipital SAH incidental, not related to presenting symptoms  10/2014 left occipital hemorrhage secondary to hypertension versus CAA.  Chronic right occipital ICH.  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 Etiology probably amyloid angiopathy. His prognosis of surviving this and making meaningful recovery and living independently is quite slim. I explained this to the patient's wife but she wants full support and aggressive medical care. Recommend strict blood pressure control with systolic blood pressure goal below  160. Keep normothermic and euglycemic. Stop Hypertonic saline added serum sodium gradually drifted down to normal. Patient may need  tracheostomy if he cannot be weaned off ventilatory support over the next few days.Long discussion with wife at the bedside and with Dr. Denese KillingsAgarwala critical care medicine.This patient is critically ill and at significant risk of neurological worsening, death and care requires constant monitoring of vital signs, hemodynamics,respiratory and cardiac monitoring, extensive review of multiple databases, frequent neurological assessment, discussion with family, other specialists and medical decision making of high complexity.I have made any additions or clarifications directly to the above note.This critical care time does not reflect procedure time, or teaching time or supervisory time of PA/NP/Med Resident etc but could involve care discussion time.  I spent 30 minutes of neurocritical care time  in the care of  this patient.      Delia HeadyPramod Janda Cargo, MD Medical Director [facility] Pager: [phone] 04/20/2018 4:17 PM  To contact Stroke Continuity provider, please refer to [email or web address]. After hours, contact General Neurology

## 2018-04-20 NOTE — Progress Notes (Signed)
eLink Physician-Brief Progress Note Patient Name: Franklen Zidek DOB: 1955/04/12 MRN: [id number]   Date of Service  04/20/2018  HPI/Events of Note  Pt is agitated and dyssynchronous on the ventilator. Propofol was discontinued today. He is biting down on the ET Tube. Also quite tachycardic.  eICU Interventions  Precedex infusion ordered        Prospero Mahnke U Tymeshia Awan 04/20/2018, 1:11 AM

## 2018-04-21 LAB — BASIC METABOLIC PANEL
Anion gap: 10 (ref 5–15)
BUN: 46 mg/dL — ABNORMAL HIGH (ref 8–23)
CO2: 25 mmol/L (ref 22–32)
Calcium: 8.8 mg/dL — ABNORMAL LOW (ref 8.9–10.3)
Chloride: 125 mmol/L — ABNORMAL HIGH (ref 98–111)
Creatinine, Ser: 1.53 mg/dL — ABNORMAL HIGH (ref 0.61–1.24)
GFR calc Af Amer: 55 mL/min — ABNORMAL LOW (ref >60)
GFR, EST NON AFRICAN AMERICAN: 47 mL/min — AB (ref >60)
Glucose, Bld: 144 mg/dL — ABNORMAL HIGH (ref 70–99)
Potassium: 3.6 mmol/L (ref 3.5–5.1)
Sodium: 160 mmol/L — ABNORMAL HIGH (ref 135–145)

## 2018-04-21 LAB — GLUCOSE, CAPILLARY
GLUCOSE-CAPILLARY: 142 mg/dL — AB (ref 70–99)
Glucose-Capillary: 126 mg/dL — ABNORMAL HIGH (ref 70–99)
Glucose-Capillary: 135 mg/dL — ABNORMAL HIGH (ref 70–99)
Glucose-Capillary: 149 mg/dL — ABNORMAL HIGH (ref 70–99)
Glucose-Capillary: 124 mg/dL — ABNORMAL HIGH (ref 70–99)
Glucose-Capillary: 155 mg/dL — ABNORMAL HIGH (ref 70–99)

## 2018-04-21 LAB — CBC
HCT: 39.8 % (ref 39.0–52.0)
Hemoglobin: 12.2 g/dL — ABNORMAL LOW (ref 13.0–17.0)
MCH: 26 pg (ref 26.0–34.0)
MCHC: 30.7 g/dL (ref 30.0–36.0)
MCV: 84.7 fL (ref 80.0–100.0)
Platelets: 107 10*3/uL — ABNORMAL LOW (ref 150–400)
RBC: 4.7 MIL/uL (ref 4.22–5.81)
RDW: 16.2 % — ABNORMAL HIGH (ref 11.5–15.5)
WBC: 10.4 10*3/uL (ref 4.0–10.5)
nRBC: 0 % (ref 0.0–0.2)

## 2018-04-21 LAB — TRIGLYCERIDES: TRIGLYCERIDES: 182 mg/dL — AB (ref <150)

## 2018-04-21 LAB — SODIUM: SODIUM: 160 mmol/L — AB (ref 135–145)

## 2018-04-21 MED ORDER — HYDRALAZINE HCL 25 MG PO TABS
25.0000 mg | ORAL_TABLET | Freq: Three times a day (TID) | ORAL | Status: DC
  Administered 2018-04-22 – 2018-04-23 (×4): 25 mg
  Filled 2018-04-21 (×4): qty 1

## 2018-04-21 MED ORDER — SODIUM CHLORIDE 0.45 % IV SOLN
INTRAVENOUS | Status: DC
  Administered 2018-04-21 – 2018-04-22 (×2): via INTRAVENOUS

## 2018-04-21 MED ORDER — DEXAMETHASONE SODIUM PHOSPHATE 10 MG/ML IJ SOLN
10.0000 mg | Freq: Two times a day (BID) | INTRAMUSCULAR | Status: AC
  Administered 2018-04-21 – 2018-04-22 (×3): 10 mg via INTRAVENOUS
  Filled 2018-04-21 (×3): qty 1

## 2018-04-21 MED ORDER — CHLORHEXIDINE GLUCONATE 0.12 % MT SOLN
15.0000 mL | Freq: Two times a day (BID) | OROMUCOSAL | Status: DC
  Administered 2018-04-21 – 2018-04-27 (×11): 15 mL via OROMUCOSAL
  Filled 2018-04-21 (×8): qty 15

## 2018-04-21 MED ORDER — RACEPINEPHRINE HCL 2.25 % IN NEBU
INHALATION_SOLUTION | RESPIRATORY_TRACT | Status: AC
  Administered 2018-04-21: 0.5 mL via RESPIRATORY_TRACT
  Filled 2018-04-21: qty 0.5

## 2018-04-21 MED ORDER — ORAL CARE MOUTH RINSE
15.0000 mL | Freq: Two times a day (BID) | OROMUCOSAL | Status: DC
  Administered 2018-04-21 – 2018-04-24 (×6): 15 mL via OROMUCOSAL

## 2018-04-21 MED ORDER — CLEVIDIPINE BUTYRATE 0.5 MG/ML IV EMUL
0.0000 mg/h | INTRAVENOUS | Status: DC
  Administered 2018-04-21: 2 mg/h via INTRAVENOUS
  Administered 2018-04-22: 8 mg/h via INTRAVENOUS
  Filled 2018-04-21 (×2): qty 50

## 2018-04-21 MED ORDER — PIPERACILLIN-TAZOBACTAM 3.375 G IVPB
3.3750 g | Freq: Three times a day (TID) | INTRAVENOUS | Status: DC
  Administered 2018-04-22 – 2018-04-23 (×4): 3.375 g via INTRAVENOUS
  Filled 2018-04-21 (×4): qty 50

## 2018-04-21 MED ORDER — PIPERACILLIN-TAZOBACTAM 3.375 G IVPB 30 MIN
3.3750 g | Freq: Once | INTRAVENOUS | Status: AC
  Administered 2018-04-22: 3.375 g via INTRAVENOUS
  Filled 2018-04-21: qty 50

## 2018-04-21 MED ORDER — RACEPINEPHRINE HCL 2.25 % IN NEBU
0.5000 mL | INHALATION_SOLUTION | RESPIRATORY_TRACT | Status: AC
  Administered 2018-04-21: 0.5 mL via RESPIRATORY_TRACT

## 2018-04-21 NOTE — Progress Notes (Signed)
Pt tachycardic (HR 130-135bpm), tachypneic (RR 25-20), stridor on auscultation, agitated. Marisue Ivan, RT at bedside and paged Dr. Denese Killings. Repositioned pt to sit upright. Will continue to closely monitor pt.

## 2018-04-21 NOTE — Progress Notes (Signed)
STROKE TEAM PROGRESS NOTE   INTERVAL HISTORY His wife is at the bedside.  Marland Kitchen He is more alert and interactive today. Is following commands well. He continues to have left field cut and dense hemiplegia.   patient is   being weaned off ventilatory support.  Vitals:   04/21/18 1001 04/21/18 1100 04/21/18 1256 04/21/18 1500  BP:   (!) 177/92 (!) 179/100  Pulse:      Resp:      Temp:  99 F (37.2 C)    TempSrc:  Oral    SpO2: 90%     Weight:      Height:        CBC:  Recent Labs  Lab 04/16/18 0739  04/20/18 1447 04/21/18 0425  WBC 9.1   < > 11.3* 10.4  NEUTROABS 8.0*  --   --   --   HGB 15.3   < > 11.8* 12.2*  HCT 50.0   < > 39.1 39.8  MCV 84.9   < > 85.9 84.7  PLT 167   < > 107* 107*   < > = values in this interval not displayed.    Basic Metabolic Panel:  Recent Labs  Lab 04/18/18 1700  04/19/18 0557  04/20/18 1614  04/21/18 0425 04/21/18 1225  NA  --    < > 159*   < > 159*  159*   < > 160* 160*  K  --   --  3.0*  --  3.8  --  3.6  --   CL  --   --  124*  --  122*  --  125*  --   CO2  --   --  25  --  24  --  25  --   GLUCOSE  --   --  142*  --  109*  --  144*  --   BUN  --   --  13  --  56*  --  46*  --   CREATININE  --   --  0.83  --  2.81*  --  1.53*  --   CALCIUM  --   --  9.2  --  8.5*  --  8.8*  --   MG 2.8*  --  2.2  --   --   --   --   --   PHOS 2.1*  --  3.1  --   --   --   --   --    < > = values in this interval not displayed.   Lipid Panel:     Component Value Date/Time   CHOL 169 06/01/2017 0551   TRIG 182 (H) 04/21/2018 0425   HDL 47 06/01/2017 0551   CHOLHDL 3.6 06/01/2017 0551   VLDL 19 06/01/2017 0551   LDLCALC 103 (H) 06/01/2017 0551   HgbA1c:  Lab Results  Component Value Date   HGBA1C 5.5 06/01/2017   Urine Drug Screen:     Component Value Date/Time   LABOPIA NONE DETECTED 12/21/2017 2014   COCAINSCRNUR NONE DETECTED 12/21/2017 2014   LABBENZ NONE DETECTED 12/21/2017 2014   AMPHETMU NONE DETECTED 12/21/2017 2014   THCU NONE  DETECTED 12/21/2017 2014   LABBARB NONE DETECTED 12/21/2017 2014    Alcohol Level     Component Value Date/Time   ETH <10 12/21/2017 1644    IMAGING No results found.  PHYSICAL EXAM  middle-aged African-American gentleman who is sedated and intubated. . Afebrile. Head is nontraumatic.  Neck is supple without bruit.    Cardiac exam no murmur or gallop. Lungs are clear to auscultation. Distal pulses are well felt. Neurological Exam ;    intubated.eyes are open. Right gaze preference and not able to look to the left even with reflex eye movements. Pupils are 3 mm irregular but reactive. Corneal reflexes are present. Fundi not visualized. Tongue midline. Left lower facial weakness. Flaccid left hemiplegia with no carotid painful stimuli. Purposeful antigravity movement on the right side. Follows commands  on the right side.. Tone is normal on the right and diminished on the left. Right plantar equivocal and left is upgoing.  ASSESSMENT/PLAN Mr. Antionette Polesony Verville is a 63 y.o. 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   Stroke:   Large right temporoparietal hemorrhage in patient with known cerebral amyloid  Code Stroke CT head 2/2 0719 large R hemisphere multilobar ICH, vol 112 mL with significant right to left shift 8 mm and uncal herniation.  Likely amyloid.  CT head 2/2 1302 stable large right ICH, vol 123 mL. Stable right to left shift 7 mm.  Likely amyloid.  MRI  Large right temporal occipital hematoma with 5 mm right-to-left shift no hydrocephalus. Extensive chronic microhemorrhages. Old left occipital hemorrhage.  MRA  No large vessel stenosis or occlusion. 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  2D Echo left ventricle has hyperdynamic systolic function of >65%. The cavity size is normal. There is no left ventricular wall thickness. Echo evidence of impaired relaxation diastolic filling  patterns  CT angiogram neck 12/21/2017 Mod L PCA stenosis. 3x4 R MCA aneurysm  LDL 103  HgbA1c 5.5  SCDs for VTE prophylaxis  aspirin 81 mg daily prior to admission, now on No antithrombotic given hemorrhage  Therapy recommendations: Pending  Disposition: Pending  Overall prognosis is poor.  Dr. Pearlean BrownieSethi discussed with her diagnosis and prognosis.  Wife wants full aggressive care.   Acute respiratory failure   Intubated  CCM consulted to manage vent and medical care  Cytotoxic cerebral edema  Induced hypernatremia  CT shows 7 to 8 mm shift and uncal herniation  On 3% protocol to decrease cerebral edema  Sodium currently 148  Goal sodium 150-155  Check sodium every 6 hours  Dysphagia   Secondary to ICH   N.p.o.   Febrile  likely secondary to ICH TM 100.6  Tylenol PRN  Cooling blanket  CCM for TTM if appropriate   Hypertensive emergency  Currently on Cleviprex   Systolic blood pressure goal less than 140   Hyperlipidemia  Home meds: Pravachol 80  LDL 103  Hold statin given ICH  Consider resumption of statin at discharge  Other Stroke Risk Factors  Former cigarette smoker, quit 24 years ago   Hx stroke/TIA  06/03/2017 R occipital SAH incidental, not related to presenting symptoms  10/2014 left occipital hemorrhage secondary to hypertension versus CAA.  Chronic right occipital ICH.  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 Etiology probably amyloid angiopathy. His prognosis of surviving this and making meaningful recovery and living independently is quite slim. I explained this to the patient's wife but she wants full support and aggressive medical care. c. Stopped Hypertonic saline added serum sodium gradually drifted  down to normal. Patient may need tracheostomy if he cannot be weaned off ventilatory support over the next few days plan to attempt at extubation today but if he fails he'll need tracheostomy and PEG tube..Long discussion with wife at the bedside and with Dr. Denese Killings critical care medicine.This patient is critically ill and at significant risk of neurological worsening, death and care requires constant monitoring of vital signs, hemodynamics,respiratory and cardiac monitoring, extensive review of multiple databases, frequent neurological assessment, discussion with family, other specialists and medical decision making of high complexity.I have made any additions or clarifications directly to the above note.This critical care time does not reflect procedure time, or teaching time or supervisory time of PA/NP/Med Resident etc but could involve care discussion time.  I spent 30 minutes of neurocritical care time  in the care of  this patient.      Delia Heady, MD Medical Director [facility] Pager: [phone] 04/21/2018 3:52 PM  To contact Stroke Continuity provider, please refer to [email or web address]. After hours, contact General Neurology

## 2018-04-21 NOTE — Progress Notes (Signed)
Rehab Admissions Coordinator Note:  Patient was screened by Trish Mage for appropriateness for an Inpatient Acute Rehab Consult.  At this time, we are recommending Inpatient Rehab consult.  Lelon Frohlich M 04/21/2018, 2:31 PM  I can be reached at [phone].

## 2018-04-21 NOTE — Progress Notes (Addendum)
OT Cancellation Note  Patient Details Name: Zymire Mikes MRN: [id number] DOB: 1955-04-10   Cancelled Treatment:    Reason Eval/Treat Not Completed: Patient not medically ready, continues to remain on vent and with active bedrest orders. Will sign off and await new orders as pt is medically appropriate.  Marcy Siren, OT Supplemental Rehabilitation Services Pager [phone] Office [phone]   Orlando Penner 04/21/2018, 7:31 AM

## 2018-04-21 NOTE — Progress Notes (Signed)
RN called RT to bedside for stridor. Pt with audible upper airway wheeze and stridor. Pt repositioned and sat upright with no improvement. RT called CCM MD and made aware. Per CCM MD, Racemic Epi given by [facility] and Pt NTSx1 L nare with small amount of hemoptysis but no secretions. Pt still with stridor but does seem to have slight improvement. RN and RT will closely monitor pt for possibility of re-intubation.

## 2018-04-21 NOTE — Consult Note (Signed)
Physical Medicine and Rehabilitation Consult Reason for Consult:  Decreased functional mobility Referring Physician: Dr. Pearlean Brownie   HPI: Luis Johnson is a 63 y.o.right handed male with history of hypertension as well as amyloid angiopathy with prior subarachnoid and intracranial hemorrhage. Per chart review patient lives with spouse. One level home 5 steps to entry. Patient used a cane prior to admission. Wife does assist with some ADLs. Presented 04/16/18 with left-sided weakness. Patient did require intubation for airway protection. Cranial CT scan showed a large acute right hemisphere multilobar intracerebral hemorrhage. Significant right to left shift of 8 mm with early uncal herniation. Patient was placed on hypertonic saline.Echocardiogram with ejection fraction of 65%. No wall motion abnormalities. Follow-up MRI/MRA on 04/18/2018 showed no emergent large vessel occlusion. Patient was extubated 04/21/2018. Subcutaneous heparin for DVT prophylaxis was initiated to 08/02/2018. NPO with alternative means of nutritional support. Therapy evaluations completed with recommendations of physical medicine rehabilitation consult.   Review of Systems  Constitutional: Negative for chills and fever.  HENT: Negative for hearing loss.   Eyes: Negative for blurred vision and double vision.  Respiratory: Negative for cough and shortness of breath.   Cardiovascular: Positive for leg swelling. Negative for chest pain and palpitations.  Gastrointestinal: Positive for constipation. Negative for nausea and vomiting.  Genitourinary: Negative for dysuria, flank pain and hematuria.  Musculoskeletal: Positive for myalgias.  Skin: Negative for rash.  Neurological: Positive for seizures and headaches.  All other systems reviewed and are negative.  Past Medical History:  Diagnosis Date  . Hypertension   . Stroke [facility])    Past Surgical History:  Procedure Laterality Date  . ARTERY BIOPSY Bilateral 06/03/2017    Procedure: BILATERAL TEMPORAL ARTERY BIOPSY;  Surgeon: Fransisco Hertz, MD;  Location: [facility] OR;  Service: Vascular;  Laterality: Bilateral;  . IR 3D INDEPENDENT WKST  06/02/2017  . IR ANGIO INTRA EXTRACRAN SEL COM CAROTID INNOMINATE BILAT MOD SED  06/02/2017  . IR ANGIO VERTEBRAL SEL VERTEBRAL BILAT MOD SED  06/02/2017  . SHOULDER SURGERY     Family History  Problem Relation Age of Onset  . Seizures Mother   . Heart failure Mother   . Intracerebral hemorrhage Mother   . Alzheimer's disease Mother   . Stroke Brother   . Heart attack Brother    Social History:  reports that he quit smoking about 24 years ago. He has never used smokeless tobacco. He reports that he does not drink alcohol or use drugs. Allergies:  Allergies  Allergen Reactions  . Eggs Or Egg-Derived Products Anaphylaxis and Swelling    Throat and body swells (can only tolerated boiled eggs)  . Acetic Acid Other (See Comments)    Pt had initial headache and believes it led to brain bleeds and blindness  . 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 Other Other (See Comments)    Headaches lead to blindness oftentimes (starts with eyes fluttering, then progresses to stuttering)   Medications Prior to Admission  Medication Sig Dispense Refill  . acetaminophen (TYLENOL) 325 MG tablet Take 1,000 mg by mouth 3 (three) times daily as needed for mild pain.     Marland Kitchen acetaminophen-codeine (TYLENOL #3) 300-30 MG tablet Take 1 tablet by mouth every 8 (eight) hours as needed. Shoulder [pain    . albuterol-ipratropium (COMBIVENT) 18-103 MCG/ACT inhaler Inhale 1 puff into the lungs every 4 (four) hours as needed for wheezing or  shortness of breath.    Marland Kitchen aspirin EC 81 MG tablet Take 81 mg by mouth once a week. On Sunday    . Cholecalciferol 50 MCG (2000 UT) TABS Take 1 tablet by mouth daily.    . divalproex (DEPAKOTE ER) 500 MG 24 hr tablet Take 1 tablet (500 mg total) by mouth daily. (Patient  taking differently: Take 500 mg by mouth 2 (two) times daily. ) 30 tablet 7  . EPINEPHrine 0.3 mg/0.3 mL IJ SOAJ injection Inject 0.3 mg into the muscle daily as needed (anaphylaxis).     Marland Kitchen escitalopram (LEXAPRO) 10 MG tablet Take 10 mg by mouth daily.    . fluticasone (FLONASE) 50 MCG/ACT nasal spray Place 2 sprays into both nostrils daily as needed for allergies.    Marland Kitchen lisinopril-hydrochlorothiazide (PRINZIDE,ZESTORETIC) 20-12.5 MG tablet Take 1 tablet by mouth daily.    . mometasone (ASMANEX) 220 MCG/INH inhaler Inhale 2 puffs into the lungs 2 (two) times daily.    . Multiple Vitamin (MULTIVITAMIN) tablet Take 1 tablet by mouth daily.    . Omega-3 Fatty Acids (FISH OIL) 1000 MG CPDR Take 1,000 tablets by mouth daily.    . pantoprazole (PROTONIX) 40 MG tablet Take 1 tablet (40 mg total) by mouth daily. 30 tablet 1  . pravastatin (PRAVACHOL) 80 MG tablet Take 80 mg by mouth daily.    Marland Kitchen REFRESH TEARS 0.5 % SOLN Place 1 drop into both eyes daily as needed for dry eyes.    . verapamil (CALAN-SR) 120 MG CR tablet Take 120 mg by mouth 2 (two) times daily.      Home: Home Living Family/patient expects to be discharged to:: Private residence Living Arrangements: Spouse/significant other, Children Available Help at Discharge: Family, Available 24 hours/day Type of Home: House Home Access: Stairs to enter Secretary/administrator of Steps: 5 Home Layout: One level Bathroom Shower/Tub: Engineer, manufacturing systems: Standard Home Equipment: Cane - single point  Functional History: Prior Function Level of Independence: Needs assistance Gait / Transfers Assistance Needed: pt reports he walks with cane  ADL's / Homemaking Assistance Needed: pt reports wife helps him with dressing, showering Functional Status:  Mobility: Bed Mobility Overal bed mobility: Needs Assistance Bed Mobility: Rolling, Sidelying to Sit, Sit to Supine Rolling: Max assist Sidelying to sit: Max assist Sit to supine: Max  assist, +2 for physical assistance General bed mobility comments: max assist to bend right knee and roll to left. Pt initially resistant to roll to left with max assist to rotate neck and elevate trunk from surface and bring legs off of bed. Return to bed with max assist to control trunk and bring legs onto surface Transfers Overall transfer level: Needs assistance Transfers: Sit to/from Stand Sit to Stand: Max assist, +2 physical assistance General transfer comment: max +2 to stand from bed x 2 trials with left knee blocked and max assist to begin to rise from surface. pt with heavy posterior bias and crouched gait with increased time to rise pt able to clear sacrum but not safe at this time to pivot to chair as BP elevated EOB and pt with significant secretions with return to sitting for suctioning  Ambulation/Gait General Gait Details: unable    ADL:    Cognition: Cognition Overall Cognitive Status: No family/caregiver present to determine baseline cognitive functioning Orientation Level: Intubated/Tracheostomy - Unable to assess Cognition Arousal/Alertness: Awake/alert Behavior During Therapy: Restless Overall Cognitive Status: No family/caregiver present to determine baseline cognitive functioning Area of Impairment: Orientation, Attention, Memory,  Following commands, Problem solving, Safety/judgement Orientation Level: Disoriented to, Time, Place Current Attention Level: Focused Memory: Decreased short-term memory Following Commands: Follows one step commands inconsistently Safety/Judgement: Decreased awareness of safety, Decreased awareness of deficits Problem Solving: Slow processing, Decreased initiation, Difficulty sequencing, Requires verbal cues, Requires tactile cues General Comments: pt perseverating in cues or task throughout session, squeezing hand or pushing up glasses (even after glasses removed). pt with increased time and difficulty transitioning between commands.    Blood pressure (!) 177/92, pulse (!) 111, temperature 99 F (37.2 C), temperature source Oral, resp. rate (!) 24, height 5\' 7"  (1.702 m), weight 71.1 kg, SpO2 90 %. Physical Exam  Eyes:  Pupils reactive to light  Neck: Normal range of motion. Neck supple. No thyromegaly present.  Cardiovascular: Normal rate and regular rhythm.  Respiratory: Effort normal and breath sounds normal. No respiratory distress.  GI: Soft. Bowel sounds are normal. He exhibits no distension.  Neurological:  Lethargic but arousable. Right gaze preference and left neglect. Follows some simple commands. He was able to state his name    Results for orders placed or performed during the hospital encounter of 04/16/18 (from the past 24 hour(s))  CBC     Status: Abnormal   Collection Time: 04/20/18  2:47 PM  Result Value Ref Range   WBC 11.3 (H) 4.0 - 10.5 K/uL   RBC 4.55 4.22 - 5.81 MIL/uL   Hemoglobin 11.8 (L) 13.0 - 17.0 g/dL   HCT 16.1 09.6 - 04.5 %   MCV 85.9 80.0 - 100.0 fL   MCH 25.9 (L) 26.0 - 34.0 pg   MCHC 30.2 30.0 - 36.0 g/dL   RDW 40.9 (H) 81.1 - 91.4 %   Platelets 107 (L) 150 - 400 K/uL   nRBC 0.0 0.0 - 0.2 %  Glucose, capillary     Status: None   Collection Time: 04/20/18  3:50 PM  Result Value Ref Range   Glucose-Capillary 94 70 - 99 mg/dL  Sodium     Status: Abnormal   Collection Time: 04/20/18  4:14 PM  Result Value Ref Range   Sodium 159 (H) 135 - 145 mmol/L  Basic metabolic panel     Status: Abnormal   Collection Time: 04/20/18  4:14 PM  Result Value Ref Range   Sodium 159 (H) 135 - 145 mmol/L   Potassium 3.8 3.5 - 5.1 mmol/L   Chloride 122 (H) 98 - 111 mmol/L   CO2 24 22 - 32 mmol/L   Glucose, Bld 109 (H) 70 - 99 mg/dL   BUN 56 (H) 8 - 23 mg/dL   Creatinine, Ser 7.82 (H) 0.61 - 1.24 mg/dL   Calcium 8.5 (L) 8.9 - 10.3 mg/dL   GFR calc non Af Amer 23 (L) >60 mL/min   GFR calc Af Amer 26 (L) >60 mL/min   Anion gap 13 5 - 15  Glucose, capillary     Status: None   Collection  Time: 04/20/18  7:23 PM  Result Value Ref Range   Glucose-Capillary 98 70 - 99 mg/dL  Sodium     Status: Abnormal   Collection Time: 04/20/18 10:13 PM  Result Value Ref Range   Sodium 158 (H) 135 - 145 mmol/L  Glucose, capillary     Status: Abnormal   Collection Time: 04/20/18 11:31 PM  Result Value Ref Range   Glucose-Capillary 115 (H) 70 - 99 mg/dL  Glucose, capillary     Status: Abnormal   Collection Time: 04/21/18  3:32 AM  Result Value Ref Range   Glucose-Capillary 126 (H) 70 - 99 mg/dL  Triglycerides     Status: Abnormal   Collection Time: 04/21/18  4:25 AM  Result Value Ref Range   Triglycerides 182 (H) <150 mg/dL  Basic metabolic panel     Status: Abnormal   Collection Time: 04/21/18  4:25 AM  Result Value Ref Range   Sodium 160 (H) 135 - 145 mmol/L   Potassium 3.6 3.5 - 5.1 mmol/L   Chloride 125 (H) 98 - 111 mmol/L   CO2 25 22 - 32 mmol/L   Glucose, Bld 144 (H) 70 - 99 mg/dL   BUN 46 (H) 8 - 23 mg/dL   Creatinine, Ser 1.611.53 (H) 0.61 - 1.24 mg/dL   Calcium 8.8 (L) 8.9 - 10.3 mg/dL   GFR calc non Af Amer 47 (L) >60 mL/min   GFR calc Af Amer 55 (L) >60 mL/min   Anion gap 10 5 - 15  CBC     Status: Abnormal   Collection Time: 04/21/18  4:25 AM  Result Value Ref Range   WBC 10.4 4.0 - 10.5 K/uL   RBC 4.70 4.22 - 5.81 MIL/uL   Hemoglobin 12.2 (L) 13.0 - 17.0 g/dL   HCT 09.639.8 04.539.0 - 40.952.0 %   MCV 84.7 80.0 - 100.0 fL   MCH 26.0 26.0 - 34.0 pg   MCHC 30.7 30.0 - 36.0 g/dL   RDW 81.116.2 (H) 91.411.5 - 78.215.5 %   Platelets 107 (L) 150 - 400 K/uL   nRBC 0.0 0.0 - 0.2 %  Glucose, capillary     Status: Abnormal   Collection Time: 04/21/18  7:55 AM  Result Value Ref Range   Glucose-Capillary 142 (H) 70 - 99 mg/dL  Glucose, capillary     Status: Abnormal   Collection Time: 04/21/18 11:35 AM  Result Value Ref Range   Glucose-Capillary 135 (H) 70 - 99 mg/dL  Sodium     Status: Abnormal   Collection Time: 04/21/18 12:25 PM  Result Value Ref Range   Sodium 160 (H) 135 - 145 mmol/L    No results found.   Assessment/Plan: Diagnosis: right multi-lobar ICH with dense left hemiparesis and associated cognitive deficits 1. Does the need for close, 24 hr/day medical supervision in concert with the patient's rehab needs make it unreasonable for this patient to be served in a less intensive setting? Yes 2. Co-Morbidities requiring supervision/potential complications: HTN, nutritional and swallowing issues 3. Due to bladder management, bowel management, safety, skin/wound care, disease management, medication administration, pain management and patient education, does the patient require 24 hr/day rehab nursing? Yes 4. Does the patient require coordinated care of a physician, rehab nurse, PT (1-2 hrs/day, 5 days/week), OT (1-2 hrs/day, 5 days/week) and SLP (1-2 hrs/day, 5 days/week) to address physical and functional deficits in the context of the above medical diagnosis(es)? Yes Addressing deficits in the following areas: balance, endurance, locomotion, strength, transferring, bowel/bladder control, bathing, dressing, feeding, grooming, toileting, cognition, speech, swallowing and psychosocial support 5. Can the patient actively participate in an intensive therapy program of at least 3 hrs of therapy per day at least 5 days per week? Yes 6. The potential for patient to make measurable gains while on inpatient rehab is excellent 7. Anticipated functional outcomes upon discharge from inpatient rehab are min assist and mod assist  with PT, min assist and mod assist with OT, supervision, min assist and mod assist with SLP. 8. Estimated rehab length of stay to reach  the above functional goals is: 20-28 days 9. Anticipated D/C setting: Home 10. Anticipated post D/C treatments: HH therapy and Outpatient therapy 11. Overall Rehab/Functional Prognosis: good  RECOMMENDATIONS: This patient's condition is appropriate for continued rehabilitative care in the following setting: CIR Patient has  agreed to participate in recommended program. N/A Note that insurance prior authorization may be required for reimbursement for recommended care.  Comment: Rehab Admissions Coordinator to follow up.  Thanks,  Ranelle Oyster, MD, [place] [facility]  I have personally performed a face to face diagnostic evaluation of this patient. Additionally, I have reviewed and concur with the physician assistant's documentation above.     Mcarthur Rossetti Angiulli, PA-C 04/21/2018

## 2018-04-21 NOTE — Progress Notes (Signed)
SBP 180. Paged Dr. Denese Killings. Verbal order to order and restart cleviprex. Discussed auscultation of faint stridor, HR 115-120, and RR 22-28. Dr. Denese Killings ordered steroids. See assoc orders. Will continue to monitor closely.

## 2018-04-21 NOTE — Progress Notes (Signed)
PT Cancellation Note  Patient Details Name: Luis Johnson MRN: [id number] DOB: 1955-12-13   Cancelled Treatment:    Reason Eval/Treat Not Completed: Patient not medically ready(pt remains on vent and bedrest. Will sign off and await new order)   [name] [name] 04/21/2018, 6:49 AM  [name], PT Acute Rehabilitation Services Pager: [phone] Office: [phone]

## 2018-04-21 NOTE — Progress Notes (Signed)
Pharmacy Antibiotic Note  Luis Johnson is a 63 y.o. male admitted on 04/16/2018, now with concern for sepsis.  Pharmacy has been consulted for Zosyn dosing.  Plan: Zosyn 3.375g IV q8h (4-hour infusion).  Height: 5\' 7"  (170.2 cm)(per record review) Weight: 156 lb 12 oz (71.1 kg) IBW/kg (Calculated) : 66.1  Temp (24hrs), Avg:100.6 F (38.1 C), Min:98.1 F (36.7 C), Max:102.7 F (39.3 C)  Recent Labs  Lab 04/16/18 0739 04/16/18 0743 04/18/18 0409 04/18/18 0610 04/19/18 0557 04/20/18 1447 04/20/18 1614 04/21/18 0425  WBC 9.1  --  11.7*  --  12.5* 11.3*  --  10.4  CREATININE 1.06 0.90  --  0.77 0.83  --  2.81* 1.53*    Estimated Creatinine Clearance: 45.6 mL/min (A) (by C-G formula based on SCr of 1.53 mg/dL (H)).    Allergies  Allergen Reactions  . Eggs Or Egg-Derived Products Anaphylaxis and Swelling    Throat and body swells (can only tolerated boiled eggs)  . Acetic Acid Other (See Comments)    Pt had initial headache and believes it led to brain bleeds and blindness  . 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 Other Other (See Comments)    Headaches lead to blindness oftentimes (starts with eyes fluttering, then progresses to stuttering)    Thank you for allowing pharmacy to be a part of this patient's care.  Vernard Gambles, PharmD, BCPS  04/21/2018 11:51 PM

## 2018-04-21 NOTE — Evaluation (Signed)
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 INtubated for airway protection 2/2-2/7. PMhx: HTN, ICH, CVA, amyloid beta related angiitis  Clinical Impression  Pt pleasant and able to verbalize throughout session although with decreased vocal volume. Pt with right gaze with left neglect and inability to rotate neck left without assist or move eyes to midline. Pt with left hemiplegia and reports he had left weakness at baseline with wife present to assist daily for ADLs and mobility but that he could walk in home with cane. Family not present to confirm PLOF. Pt with decreased cognition, strength, balance, vision, mobility and function who will benefit from acute therapy to maximize function and decrease burden of care. Pt will require post acute rehab and whether that is CIR or SNF will highly depend on pt progress and family assist. Will continue to follow and recommend lift for OOB at this time.  BP 177/92 with mobility EOB and returned to supine with RN present to administer medication.     Follow Up Recommendations CIR;Supervision/Assistance - 24 hour    Equipment Recommendations  Wheelchair (measurements PT);Wheelchair cushion (measurements PT)    Recommendations for Other Services OT consult     Precautions / Restrictions Precautions Precautions: Fall Precaution Comments: cortrak, right gaze, left HP, SBP <160 Restrictions Weight Bearing Restrictions: No      Mobility  Bed Mobility Overal bed mobility: Needs Assistance Bed Mobility: Rolling;Sidelying to Sit;Sit to Supine Rolling: Max assist Sidelying to sit: Max assist   Sit to supine: Max assist;+2 for physical assistance   General bed mobility comments: max assist to bend right knee  and roll to left. Pt initially resistant to roll to left with max assist to rotate neck and elevate trunk from surface and bring legs off of bed. Return to bed with max assist to control trunk and bring legs onto surface  Transfers Overall transfer level: Needs assistance   Transfers: Sit to/from Stand Sit to Stand: Max assist;+2 physical assistance         General transfer comment: max +2 to stand from bed x 2 trials with left knee blocked and max assist to begin to rise from surface. pt with heavy posterior bias and crouched gait with increased time to rise pt able to clear sacrum but not safe at this time to pivot to chair as BP elevated EOB and pt with significant secretions with return to sitting for suctioning   Ambulation/Gait             General Gait Details: unable  Stairs            Wheelchair Mobility    Modified Rankin (Stroke Patients Only)       Balance Overall balance assessment: Needs assistance   Sitting balance-Leahy Scale: Fair Sitting balance - Comments: pt able to sit EOB with minguard assist for safety and lines, pt with and without RUE support in sitting     Standing balance-Leahy Scale: Zero Standing balance comment: max +2 for standing balance with pad and belt with posterior bias and crouched gait                             Pertinent Vitals/Pain Pain Assessment: No/denies pain    Home Living Family/patient expects to be  discharged to:: Private residence Living Arrangements: Spouse/significant other;Children Available Help at Discharge: Family;Available 24 hours/day Type of Home: House Home Access: Stairs to enter   [employer] of Steps: 5 Home Layout: One level Home Equipment: Cane - single point      Prior Function Level of Independence: Needs assistance   Gait / Transfers Assistance Needed: pt reports he walks with cane   ADL's / Homemaking Assistance Needed: pt reports wife helps him with dressing,  showering        Hand Dominance        Extremity/Trunk Assessment   Upper Extremity Assessment Upper Extremity Assessment: Defer to OT evaluation    Lower Extremity Assessment Lower Extremity Assessment: LLE deficits/detail LLE Deficits / Details: pt with no active movement in LLE, no sensation to light touch    Cervical / Trunk Assessment Cervical / Trunk Assessment: Other exceptions Cervical / Trunk Exceptions: right gaze with right neck rotation and report of pain with assisted rotation to midline, rounded shoulders  Communication      Cognition Arousal/Alertness: Awake/alert Behavior During Therapy: Restless Overall Cognitive Status: No family/caregiver present to determine baseline cognitive functioning Area of Impairment: Orientation;Attention;Memory;Following commands;Problem solving;Safety/judgement                 Orientation Level: Disoriented to;Time;Place Current Attention Level: Focused Memory: Decreased short-term memory Following Commands: Follows one step commands inconsistently Safety/Judgement: Decreased awareness of safety;Decreased awareness of deficits   Problem Solving: Slow processing;Decreased initiation;Difficulty sequencing;Requires verbal cues;Requires tactile cues General Comments: pt perseverating in cues or task throughout session, squeezing hand or pushing up glasses (even after glasses removed). pt with increased time and difficulty transitioning between commands.       General Comments      Exercises     Assessment/Plan    PT Assessment Patient needs continued PT services  PT Problem List Decreased strength;Decreased balance;Decreased cognition;Decreased range of motion;Decreased mobility;Decreased knowledge of use of DME;Decreased activity tolerance;Decreased coordination;Decreased safety awareness;Impaired sensation;Cardiopulmonary status limiting activity       PT Treatment Interventions DME instruction;Functional  mobility training;Balance training;Patient/family education;Therapeutic activities;Neuromuscular re-education;Wheelchair mobility training;Therapeutic exercise;Cognitive remediation    PT Goals (Current goals can be found in the Care Plan section)  Acute Rehab PT Goals Patient Stated Goal: return home PT Goal Formulation: With patient Time For Goal Achievement: 05/05/18 Potential to Achieve Goals: Fair    Frequency Min 4X/week   Barriers to discharge Decreased caregiver support      Co-evaluation               AM-PAC PT "6 Clicks" Mobility  Outcome Measure Help needed turning from your back to your side while in a flat bed without using bedrails?: A Lot Help needed moving from lying on your back to sitting on the side of a flat bed without using bedrails?: A Lot Help needed moving to and from a bed to a chair (including a wheelchair)?: Total Help needed standing up from a chair using your arms (e.g., wheelchair or bedside chair)?: Total Help needed to walk in hospital room?: Total Help needed climbing 3-5 steps with a railing? : Total 6 Click Score: 8    End of Session Equipment Utilized During Treatment: Gait belt;Oxygen Activity Tolerance: Patient tolerated treatment well Patient left: in bed;with call bell/phone within reach;with bed alarm set Nurse Communication: Mobility status PT Visit Diagnosis: Unsteadiness on feet (R26.81);Muscle weakness (generalized) (M62.81);Other abnormalities of gait and mobility (R26.89);Other symptoms and signs involving the nervous system (R29.898)  Time: 1222-1255 PT Time Calculation (min) (ACUTE ONLY): 33 min   Charges:   PT Evaluation $PT Eval Moderate Complexity: 1 Mod PT Treatments $Therapeutic Activity: 8-22 mins        Amire Leazer Abner Greenspanabor Merwyn Hodapp, PT Acute Rehabilitation Services Pager: [phone] Office: [phone]   Chrisanne Loose B Kashina Mecum 04/21/2018, 1:13 PM

## 2018-04-21 NOTE — Progress Notes (Addendum)
NAME:  Luis Johnson, MRN:  [id number], DOB:  Mar 30, 1955, LOS: 5 ADMISSION DATE:  04/16/2018, CONSULTATION DATE:  04/17/2018 REFERRING MD:  Cherly Beach, CHIEF COMPLAINT:  AMS   HPI/course in hospital  Luis Johnson is a 63 y.o. male with amyloid angiopathy who was admitted 2/2 with ICH and required intubation for airway protection.  Past Medical History   Past Medical History:  Diagnosis Date  . Hypertension   . Stroke [facility])      Past Surgical History:  Procedure Laterality Date  . ARTERY BIOPSY Bilateral 06/03/2017   Procedure: BILATERAL TEMPORAL ARTERY BIOPSY;  Surgeon: Fransisco Hertz, MD;  Location: [facility] OR;  Service: Vascular;  Laterality: Bilateral;  . IR 3D INDEPENDENT WKST  06/02/2017  . IR ANGIO INTRA EXTRACRAN SEL COM CAROTID INNOMINATE BILAT MOD SED  06/02/2017  . IR ANGIO VERTEBRAL SEL VERTEBRAL BILAT MOD SED  06/02/2017  . SHOULDER SURGERY      Interim history/subjective:  Very awake and at times agitated today, low dose fentanyl resumed. Following commands.  Objective   Blood pressure (!) 157/79, pulse (!) 119, temperature (!) 100.7 F (38.2 C), temperature source Oral, resp. rate 17, height 5\' 7"  (1.702 m), weight 71.1 kg, SpO2 99 %.    Vent Mode: PRVC FiO2 (%):  [30 %-40 %] 30 % Set Rate:  [16 bmp] 16 bmp Vt Set:  [470 mL-490 mL] 490 mL PEEP:  [5 cmH20] 5 cmH20 Pressure Support:  [8 cmH20] 8 cmH20 Plateau Pressure:  [14 cmH20-15 cmH20] 15 cmH20   Intake/Output Summary (Last 24 hours) at 04/21/2018 0935 Last data filed at 04/21/2018 0800 Gross per 24 hour  Intake 2542.07 ml  Output 1250 ml  Net 1292.07 ml   Filed Weights   04/18/18 0500 04/20/18 0444 04/21/18 0500  Weight: 70.3 kg 71 kg 71.1 kg    Examination: General:  Adult male on vent Neuro: Awake and following commands with 4/5 strength on right side with strong cough and good truncal strength.  HEENT:  [place]/AT, No JVD noted, PERRL Cardiovascular:  RRR, no MRG Lungs:  Clear bilateral breath sounds. Tolerating  SBT Abdomen:  Soft, non-distended Musculoskeletal:  No acute deformity Skin:  Intact, MMM   Ancillary tests   CBC: Recent Labs  Lab 04/16/18 0739 04/16/18 1053 04/18/18 0409 04/19/18 0557 04/20/18 1447 04/21/18 0425  WBC 9.1  --  11.7* 12.5* 11.3* 10.4  NEUTROABS 8.0*  --   --   --   --   --   HGB 15.3 15.3 14.0 13.8 11.8* 12.2*  HCT 50.0 45.0 46.0 44.7 39.1 39.8  MCV 84.9  --  86.6 86.3 85.9 84.7  PLT 167  --  219 193 107* 107*    Basic Metabolic Panel: Recent Labs  Lab 04/16/18 0739 04/16/18 0743 04/16/18 1053  04/17/18 1811  04/18/18 0610  04/18/18 1700  04/19/18 0557  04/20/18 0432 04/20/18 1000 04/20/18 1614 04/20/18 2213 04/21/18 0425  NA 138  138  --  138   < > 150*   < > 151*   < >  --    < > 159*   < > 160* 148* 159*  159* 158* 160*  K 5.3*  --  3.3*  --   --   --  5.0  --   --   --  3.0*  --   --   --  3.8  --  3.6  CL 100  --   --   --   --   --  121*  --   --   --  124*  --   --   --  122*  --  125*  CO2 22  --   --   --   --   --  24  --   --   --  25  --   --   --  24  --  25  GLUCOSE 119*  --   --   --   --   --  147*  --   --   --  142*  --   --   --  109*  --  144*  BUN 17  --   --   --   --   --  13  --   --   --  13  --   --   --  56*  --  46*  CREATININE 1.06 0.90  --   --   --   --  0.77  --   --   --  0.83  --   --   --  2.81*  --  1.53*  CALCIUM 9.7  --   --   --   --   --  8.6*  --   --   --  9.2  --   --   --  8.5*  --  8.8*  MG  --   --   --   --  2.5*  --  2.3  --  2.8*  --  2.2  --   --   --   --   --   --   PHOS  --   --   --   --  2.5  --  2.0*  --  2.1*  --  3.1  --   --   --   --   --   --    < > = values in this interval not displayed.   GFR: Estimated Creatinine Clearance: 45.6 mL/min (A) (by C-G formula based on SCr of 1.53 mg/dL (H)). Recent Labs  Lab 04/18/18 0409 04/19/18 0557 04/20/18 1447 04/21/18 0425  WBC 11.7* 12.5* 11.3* 10.4    Liver Function Tests: Recent Labs  Lab 04/16/18 0739  AST 27  ALT 14    ALKPHOS 44  BILITOT 1.4*  PROT 8.6*  ALBUMIN 4.1   No results for input(s): LIPASE, AMYLASE in the last 168 hours. No results for input(s): AMMONIA in the last 168 hours.  ABG    Component Value Date/Time   PHART 7.376 04/16/2018 1053   PCO2ART 48.2 (H) 04/16/2018 1053   PO2ART 372.0 (H) 04/16/2018 1053   HCO3 28.3 (H) 04/16/2018 1053   TCO2 30 04/16/2018 1053   O2SAT 100.0 04/16/2018 1053     Coagulation Profile: Recent Labs  Lab 04/16/18 0739  INR 0.99    Cardiac Enzymes: No results for input(s): CKTOTAL, CKMB, CKMBINDEX, TROPONINI in the last 168 hours.  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 S/p 3% NaCl infusion stopped 2/5. - Stroke service following - Off hypertonic saline, allowing Na to drift down.   Acute respiratory failure requiring mechanical ventilation - Ready for attempt at extubation today.  Hypertensive crisis: Clevidipine now off - SBP goal < 160 mmHg - Continue hydralazine, verapamil.   Acute Kidney injury: likely due to ACEi and dehydration. Improving - Continue IV fluids - switch to 1/2 N/S to help correct hypernatremia - Hold ACEi for now.   Thrombocytopenia: mild and appears to be stabilizing.  - Follow for now.   Best practice:  Diet:  TF via cortrak Pain/Anxiety/Delirium protocol (if indicated): fentanyl infusion. VAP  protocol (if indicated):  Bundle in place. DVT prophylaxis: SCD GI prophylaxis: Pantoprazole Glucose control: control adequate with SSI  Mobility: bedrest Code Status: full  Family Communication:  Wife updated at length. Understands his prognosis is poor, but not yet willing to make decisions regarding goals or code status. FULL code. I have informed her that if he does not improve by Monday or Tuesday we will need to address this more seriously.  Disposition: ICU.   Total critical care time: 40 minutes including chart review, examination of the patient, participation in multidisciplinary rounds, evaluation modifications and sedative and ventilatory therapy. Supervision off SBT and extubation.       Lynnell Catalan, MD Staff Critical Care Physician [facility] Pager: [phone] Off hours: [phone]  04/21/2018, 9:35 AM

## 2018-04-21 NOTE — Procedures (Signed)
Extubation Procedure Note  Patient Details:   Name: Luis Johnson DOB: 28-May-1955 MRN: [id number]   Airway Documentation:    Vent end date: 04/21/18 Vent end time: 1001   Evaluation  O2 sats: stable throughout Complications: No apparent complications Patient did tolerate procedure well. Bilateral Breath Sounds: Clear, Diminished   Yes   Pt extubated to 3L [place] per MD order. Positive cuff leak heard prior to extubation. Pt able to speak and has a weak non-productive cough post extubation. VS within normal limits. No increased WOB, no Respiratory distress. Rhonchi heard bilaterally. RT will closely monitor pt for increased secretions and possibility of airway clearance or assistance with breathing.   Ermalinda Barrios M 04/21/2018, 10:04 AM

## 2018-04-22 ENCOUNTER — Inpatient Hospital Stay (HOSPITAL_COMMUNITY): Payer: Medicare Other

## 2018-04-22 DIAGNOSIS — G9340 Encephalopathy, unspecified: Secondary | ICD-10-CM

## 2018-04-22 DIAGNOSIS — I161 Hypertensive emergency: Secondary | ICD-10-CM

## 2018-04-22 DIAGNOSIS — D72829 Elevated white blood cell count, unspecified: Secondary | ICD-10-CM

## 2018-04-22 DIAGNOSIS — J9690 Respiratory failure, unspecified, unspecified whether with hypoxia or hypercapnia: Secondary | ICD-10-CM

## 2018-04-22 DIAGNOSIS — R509 Fever, unspecified: Secondary | ICD-10-CM

## 2018-04-22 DIAGNOSIS — N179 Acute kidney failure, unspecified: Secondary | ICD-10-CM

## 2018-04-22 DIAGNOSIS — E785 Hyperlipidemia, unspecified: Secondary | ICD-10-CM

## 2018-04-22 DIAGNOSIS — I680 Cerebral amyloid angiopathy: Secondary | ICD-10-CM

## 2018-04-22 LAB — LIPID PANEL
Cholesterol: 217 mg/dL — ABNORMAL HIGH (ref 0–200)
HDL: 33 mg/dL — ABNORMAL LOW (ref >40)
LDL Cholesterol: 154 mg/dL — ABNORMAL HIGH (ref 0–99)
Total CHOL/HDL Ratio: 6.6 RATIO
Triglycerides: 148 mg/dL (ref <150)
VLDL: 30 mg/dL (ref 0–40)

## 2018-04-22 LAB — CBC WITH DIFFERENTIAL/PLATELET
Abs Immature Granulocytes: 0.05 10*3/uL (ref 0.00–0.07)
Basophils Absolute: 0 10*3/uL (ref 0.0–0.1)
Basophils Relative: 0 %
Eosinophils Absolute: 0 10*3/uL (ref 0.0–0.5)
Eosinophils Relative: 0 %
HCT: 40.4 % (ref 39.0–52.0)
Hemoglobin: 12.3 g/dL — ABNORMAL LOW (ref 13.0–17.0)
Immature Granulocytes: 1 %
Lymphocytes Relative: 4 %
Lymphs Abs: 0.3 10*3/uL — ABNORMAL LOW (ref 0.7–4.0)
MCH: 25.6 pg — ABNORMAL LOW (ref 26.0–34.0)
MCHC: 30.4 g/dL (ref 30.0–36.0)
MCV: 84 fL (ref 80.0–100.0)
Monocytes Absolute: 0.3 10*3/uL (ref 0.1–1.0)
Monocytes Relative: 3 %
Neutro Abs: 7.9 10*3/uL — ABNORMAL HIGH (ref 1.7–7.7)
Neutrophils Relative %: 92 %
Platelets: 133 10*3/uL — ABNORMAL LOW (ref 150–400)
RBC: 4.81 MIL/uL (ref 4.22–5.81)
RDW: 16.1 % — ABNORMAL HIGH (ref 11.5–15.5)
WBC: 8.5 10*3/uL (ref 4.0–10.5)
nRBC: 0 % (ref 0.0–0.2)

## 2018-04-22 LAB — GLUCOSE, CAPILLARY
GLUCOSE-CAPILLARY: 146 mg/dL — AB (ref 70–99)
GLUCOSE-CAPILLARY: 159 mg/dL — AB (ref 70–99)
Glucose-Capillary: 207 mg/dL — ABNORMAL HIGH (ref 70–99)
Glucose-Capillary: 152 mg/dL — ABNORMAL HIGH (ref 70–99)
Glucose-Capillary: 152 mg/dL — ABNORMAL HIGH (ref 70–99)
Glucose-Capillary: 155 mg/dL — ABNORMAL HIGH (ref 70–99)

## 2018-04-22 LAB — URINALYSIS, COMPLETE (UACMP) WITH MICROSCOPIC
Bacteria, UA: NONE SEEN
Bilirubin Urine: NEGATIVE
Glucose, UA: >=500 mg/dL — AB
Hgb urine dipstick: NEGATIVE
Ketones, ur: NEGATIVE mg/dL
Leukocytes, UA: NEGATIVE
Nitrite: NEGATIVE
Protein, ur: NEGATIVE mg/dL
Specific Gravity, Urine: 1.015 (ref 1.005–1.030)
pH: 8 (ref 5.0–8.0)

## 2018-04-22 LAB — BASIC METABOLIC PANEL
Anion gap: 6 (ref 5–15)
BUN: 29 mg/dL — ABNORMAL HIGH (ref 8–23)
CO2: 24 mmol/L (ref 22–32)
Calcium: 9.4 mg/dL (ref 8.9–10.3)
Chloride: 122 mmol/L — ABNORMAL HIGH (ref 98–111)
Creatinine, Ser: 1.04 mg/dL (ref 0.61–1.24)
GFR calc Af Amer: >60 mL/min (ref >60)
GFR calc non Af Amer: >60 mL/min (ref >60)
Glucose, Bld: 167 mg/dL — ABNORMAL HIGH (ref 70–99)
Potassium: 3.9 mmol/L (ref 3.5–5.1)
Sodium: 152 mmol/L — ABNORMAL HIGH (ref 135–145)

## 2018-04-22 LAB — RAPID URINE DRUG SCREEN, HOSP PERFORMED
Amphetamines: NOT DETECTED
Barbiturates: NOT DETECTED
Benzodiazepines: NOT DETECTED
Cocaine: NOT DETECTED
Opiates: NOT DETECTED
Tetrahydrocannabinol: NOT DETECTED

## 2018-04-22 LAB — HEMOGLOBIN A1C
Hgb A1c MFr Bld: 6.1 % — ABNORMAL HIGH (ref 4.8–5.6)
Mean Plasma Glucose: 128.37 mg/dL

## 2018-04-22 MED ORDER — SODIUM CHLORIDE 0.9 % IV SOLN
INTRAVENOUS | Status: DC | PRN
  Administered 2018-04-22: 250 mL via INTRAVENOUS

## 2018-04-22 MED ORDER — FUROSEMIDE 10 MG/ML IJ SOLN
20.0000 mg | Freq: Four times a day (QID) | INTRAMUSCULAR | Status: DC
  Administered 2018-04-22 – 2018-04-23 (×2): 20 mg via INTRAVENOUS
  Filled 2018-04-22 (×2): qty 2

## 2018-04-22 MED ORDER — FUROSEMIDE 10 MG/ML IJ SOLN
40.0000 mg | Freq: Once | INTRAMUSCULAR | Status: AC
  Administered 2018-04-22: 40 mg via INTRAVENOUS
  Filled 2018-04-22: qty 4

## 2018-04-22 MED ORDER — LABETALOL HCL 200 MG PO TABS
200.0000 mg | ORAL_TABLET | Freq: Three times a day (TID) | ORAL | Status: DC
  Administered 2018-04-22 – 2018-04-25 (×8): 200 mg via ORAL
  Filled 2018-04-22 (×4): qty 1
  Filled 2018-04-22 (×2): qty 2
  Filled 2018-04-22: qty 1
  Filled 2018-04-22: qty 2
  Filled 2018-04-22 (×2): qty 1
  Filled 2018-04-22: qty 2

## 2018-04-22 MED ORDER — POTASSIUM CHLORIDE 20 MEQ/15ML (10%) PO SOLN
40.0000 meq | Freq: Three times a day (TID) | ORAL | Status: AC
  Administered 2018-04-22 (×2): 40 meq
  Filled 2018-04-22 (×2): qty 30

## 2018-04-22 NOTE — Progress Notes (Signed)
STROKE TEAM PROGRESS NOTE   INTERVAL HISTORY RN at bedside. Pt awake alert, still has cortrak. However, has copious secretions and gurgling sound in the throat. CXR concerning for vascular congestion, will give lasix once. Na 152, improved from yesterday 160, decrease 1/2NS to 40cc/h. Cre now normalized. Afebrile this am.   Vitals:   04/22/18 0800 04/22/18 0830 04/22/18 1130 04/22/18 1200  BP: 137/74 (!) 153/77 (!) 158/113 (!) 150/93  Pulse:  (!) 115 (!) 121 (!) 115  Resp:  (!) 23 (!) 35 (!) 22  Temp: 99.5 F (37.5 C)     TempSrc: Oral     SpO2:  96% 95% 96%  Weight:      Height:        CBC:  Recent Labs  Lab 04/16/18 0739  04/21/18 0425 04/22/18 0647  WBC 9.1   < > 10.4 8.5  NEUTROABS 8.0*  --   --  7.9*  HGB 15.3   < > 12.2* 12.3*  HCT 50.0   < > 39.8 40.4  MCV 84.9   < > 84.7 84.0  PLT 167   < > 107* 133*   < > = values in this interval not displayed.    Basic Metabolic Panel:  Recent Labs  Lab 04/18/18 1700  04/19/18 0557  04/21/18 0425 04/21/18 1225 04/22/18 0647  NA  --    < > 159*   < > 160* 160* 152*  K  --   --  3.0*   < > 3.6  --  3.9  CL  --   --  124*   < > 125*  --  122*  CO2  --   --  25   < > 25  --  24  GLUCOSE  --   --  142*   < > 144*  --  167*  BUN  --   --  13   < > 46*  --  29*  CREATININE  --   --  0.83   < > 1.53*  --  1.04  CALCIUM  --   --  9.2   < > 8.8*  --  9.4  MG 2.8*  --  2.2  --   --   --   --   PHOS 2.1*  --  3.1  --   --   --   --    < > = values in this interval not displayed.   Lipid Panel:     Component Value Date/Time   CHOL 217 (H) 04/22/2018 0647   TRIG 148 04/22/2018 0647   HDL 33 (L) 04/22/2018 0647   CHOLHDL 6.6 04/22/2018 0647   VLDL 30 04/22/2018 0647   LDLCALC 154 (H) 04/22/2018 0647   HgbA1c:  Lab Results  Component Value Date   HGBA1C 6.1 (H) 04/22/2018   Urine Drug Screen:     Component Value Date/Time   LABOPIA NONE DETECTED 04/21/2018 0245   COCAINSCRNUR NONE DETECTED 04/21/2018 0245   LABBENZ  NONE DETECTED 04/21/2018 0245   AMPHETMU NONE DETECTED 04/21/2018 0245   THCU NONE DETECTED 04/21/2018 0245   LABBARB NONE DETECTED 04/21/2018 0245    Alcohol Level     Component Value Date/Time   ETH <10 12/21/2017 1644    IMAGING  Ct Head Wo Contrast 04/19/2018 IMPRESSION:  1. Interval decrease in size of right temporal occipital intraparenchymal hemorrhage, estimated volume 96 cc on today's exam, previously 123 cc on 04/16/2018.  2. Slightly improved  associated edema and regional mass effect. Right-to-left midline shift now measures 5 mm, previously 7 mm.  3. No other new acute intracranial abnormality.   Ct Head Wo Contrast 04/16/2018 IMPRESSION:  1. Stable large posterior right hemisphere intra-axial hemorrhage since 0721 hours today, which might be the sequelae of an amyloid angiopathy. Estimated blood volume 123 mL, with no intraventricular or extra-axial extension.  2. Stable associated mass effect with leftward midline shift of 7 mm and subtotal effacement of the right lateral ventricle.  3. No new intracranial abnormality.   [name] Head Wo Contrast 04/18/2018 IMPRESSION:   MRI HEAD:  1. Acute to subacute large RIGHT temporal occipital hematoma with 5 mm RIGHT-to-LEFT midline shift. No ventricular entrapment.  2. Extensive chronic microhemorrhages in advanced chronic small vessel ischemic changes seen with amyloid angiopathy/angitis.  3. Old LEFT occipital hemorrhage.   MRA HEAD:  1. No emergent large vessel occlusion or flow-limiting stenosis. Mild stenosis RIGHT M1 and LEFT P2 segment.  2. 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   Ct Head Code Stroke Wo Contrast 04/16/2018 IMPRESSION:  1. Large acute RIGHT hemisphere multilobar intracerebral hemorrhage, consistent with complication of cerebral amyloid angiopathy. Approximate volume 112 mL. Significant RIGHT-to-LEFT shift of 8 mm with early uncal herniation.  2. ASPECTS is not applicable.     PHYSICAL  EXAM  Temp:  [99.5 F (37.5 C)-101.4 F (38.6 C)] 99.5 F (37.5 C) (02/08 0800) Pulse Rate:  [104-145] 115 (02/08 1200) Resp:  [17-35] 22 (02/08 1200) BP: (121-184)/(67-137) 150/93 (02/08 1200) SpO2:  [90 %-98 %] 96 % (02/08 1200)  General - Well nourished, well developed, disorientated to place.  Ophthalmologic - fundi not visualized due to noncooperation.  Cardiovascular - Regular rate and rhythm, tachycardia.  Neuro - awake alert, orientated to self, age, people, but not to place, eyes open, able to follow some simple commands. Able to repeat simple sentences, but not able to name. Mild to moderate dysarthria, paucity of speech, mild perseveration. Eyes in right gaze preference, not crossing midline, blinking to visual threat on the right, but not to the left, tracking objects on the right visual field, PERRL. Left facial droop.  Tongue midline in mouth. Right UE and LE at least 4/5. LUE flaccid and LLE slight withdraw to pain. DTR 1+ and no babinski. Sensation symmetrical as per pt, coordination not cooperative and gait not tested.    ASSESSMENT/PLAN Mr. Moody Isaac is a 63 y.o. 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   ICH: Large right temporoparietal hemorrhage with known CAA and HTN  Code Stroke CT head 04/16/18 large R hemisphere multilobar ICH, vol 112 mL with significant right to left shift 8 mm and uncal herniation.   CT head repeat stable large right ICH, vol 123 mL. Stable right to left shift 7 mm.    MRI  Large right temporal occipital hematoma with midline shift no hydrocephalus. Extensive chronic microhemorrhages. Old left occipital hemorrhage.  MRA  No large vessel stenosis or occlusion. 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  2D Echo EF >65%.   LDL 103  HgbA1c 5.5  SCDs for VTE prophylaxis  aspirin 81 mg daily prior to admission, now on No antithrombotic given hemorrhage.    Therapy recommendations: Pending  Disposition: Pending  History of stroke and CAA  10/2014 admitted for left sided numbness, blurry vision, headache.  CT showed left occipital ICH, MRI showed left occipital ICH with  right occipital old ICH.  MRA no LVO.  Carotid Doppler and 2D echo negative.  LDL 231 and A1c 6.1  05/2017 admitted for transient bilateral vision loss and headache.  CT and MRI showed right occipital SAH.  Clinical course consistent with CAA  Acute respiratory failure   Intubated on admission  Now extubated  Patient tolerating well  CCM on board   Still has copious secretion  Chest x-ray showed vascular congestion  Lasix IV 40 mg once  Cytotoxic cerebral edema  Induced hypernatremia  CT shows 7 to 8 mm shift and uncal herniation  On 3% protocol to decrease cerebral edema, now off  Sodium currently 160-152  Low sodium slowly drift down  On 1/2 NS@40   Dysphagia   Secondary to ICH   N.p.o. for now  On core track with tube feeding @ 50  Speech on board  Febrile and leukocytosis  Tmax 102.7 ->101.4   WBC 10.4->8.5  Zosyn started 04/22/2018  Tylenol PRN  Cooling blanket  CCM for TTM if appropriate   Hypertensive emergency  Currently on Cleviprex   Hydralazine 25, labetalol 200, and verapamil  Hold off ACE inhibitor due to AKI  Taper off Cleviprex as able  BP goal < 160   Hyperlipidemia  Home meds: Pravachol 80  LDL 103  Hold statin given ICH  Consider resumption of statin at discharge  Other Stroke Risk Factors  Former cigarette smoker, quit 24 years ago   Family hx stroke (brother, Mother had Alzheimer's and died of ICH)  Other issues  AKI -creatinine 2.81-1.53-1.04  Hospital day # 6  This patient is critically ill and at significant risk of neurological worsening, death and care requires constant monitoring of vital signs, hemodynamics,respiratory and cardiac monitoring, extensive review of multiple databases,  frequent neurological assessment, discussion with family, other specialists and medical decision making of high complexity. I spent 45 minutes of neurocritical care time  in the care of  this patient.  Marvel Plan, MD PhD Stroke Neurology 04/22/2018 1:18 PM  To contact Stroke Continuity provider, please refer to [email or web address]. After hours, contact General Neurology

## 2018-04-22 NOTE — Evaluation (Signed)
Speech Language Pathology Evaluation Patient Details Name: Luis Johnson MRN: [id number] DOB: [birth year] Today's Date: 04/22/2018 Time: 0383-3383 SLP Time Calculation (min) (ACUTE ONLY): 12 min  Problem List:  Patient Active Problem List   Diagnosis Date Noted  . Malnutrition of moderate degree 04/18/2018  . Respiratory failure (HCC)   . Generalized headaches 06/29/2017  . History of temporal artery biopsy 06/29/2017  . Vision loss 06/01/2017  . Depression 05/31/2017  . HLD (hyperlipidemia) 05/31/2017  . Prostatitis, acute 02/09/2016  . HTN (hypertension) 02/09/2016  . COPD (chronic obstructive pulmonary disease) (HCC) 02/09/2016  . AKI (acute kidney injury) (HCC) 02/09/2016  . Transaminitis 02/09/2016  . Sepsis (HCC)   . UTI (urinary tract infection) 02/08/2016  . Cerebral amyloid angiopathy (HCC) 11/27/2014  . 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 ICH (intracerebral hemorrhage) (HCC) 11/11/2014   Past Medical History:  Past Medical History:  Diagnosis Date  . Hypertension   . Stroke [facility])    Past Surgical History:  Past Surgical History:  Procedure Laterality Date  . ARTERY BIOPSY Bilateral 06/03/2017   Procedure: BILATERAL TEMPORAL ARTERY BIOPSY;  Surgeon: Fransisco Hertz, MD;  Location: [facility] OR;  Service: Vascular;  Laterality: Bilateral;  . IR 3D INDEPENDENT WKST  06/02/2017  . IR ANGIO INTRA EXTRACRAN SEL COM CAROTID INNOMINATE BILAT MOD SED  06/02/2017  . IR ANGIO VERTEBRAL SEL VERTEBRAL BILAT MOD SED  06/02/2017  . SHOULDER SURGERY     HPI:  62 yo admitted with decreased responsiveness from home with large parenchymal intracerebral hemorrhage in the right parietotemporal area. INtubated for airway protection 2/2-2/7. PMhx: HTN, ICH, CVA, amyloid beta related angiitis   Assessment / Plan / Recommendation Clinical Impression  Pt has a significant dysarthria that impacts intelligibility even at the word to phrase level. His articulation is imprecise, his voice is dysphonic  s/p extubation, and he has audible wetness. He has a R sided gaze preference and L inattention. Perseverative errors are noted in verbal output and during command following. Although he knew he had a stroke, he has limited intellectual and emergent awareness of acute impairments and he is disoriented to location and time. Previous SLP evaluations noted mild short-term memory deficits but otherwise appropriate cognitive and communicative function, making this a significant change from pt's baseline. Recommend SLP f/u to maximize functional communication and safety.    SLP Assessment  SLP Recommendation/Assessment: Patient needs continued Speech Lanaguage Pathology Services SLP Visit Diagnosis: Dysarthria and anarthria (R47.1);Cognitive communication deficit (R41.841)    Follow Up Recommendations  Inpatient Rehab    Frequency and Duration min 2x/week  2 weeks      SLP Evaluation Cognition  Overall Cognitive Status: Impaired/Different from baseline(eval limited by communication abilities at the moment) Arousal/Alertness: Awake/alert Orientation Level: Oriented to person;Oriented to situation;Disoriented to place;Disoriented to time Attention: Sustained Sustained Attention: Impaired Sustained Attention Impairment: Verbal basic Memory: Impaired Memory Impairment: Decreased recall of new information Awareness: Impaired Awareness Impairment: Intellectual impairment;Emergent impairment Problem Solving: Impaired Problem Solving Impairment: Functional basic Safety/Judgment: Impaired Comments: L sided inattention, R gaze preference       Comprehension  Auditory Comprehension Overall Auditory Comprehension: Impaired Commands: Impaired One Step Basic Commands: 25-49% accurate    Expression Expression Primary Mode of Expression: Verbal Verbal Expression Overall Verbal Expression: Impaired Pragmatics: Impairment Impairments: Eye contact Other Verbal Expression Comments: perseverative  errors noted, difficult to understand much of what he says though - needs more assessment   Oral / Motor  Oral Motor/Sensory Function Overall Oral Motor/Sensory  Function: (difficulty following commands to assess) Motor Speech Overall Motor Speech: Impaired Respiration: Within functional limits Phonation: Wet;Hoarse;Low vocal intensity Resonance: Within functional limits Articulation: Impaired Level of Impairment: Word Intelligibility: Intelligibility reduced Word: 50-74% accurate Motor Speech Errors: Unaware   GO                    Virl Axe Rastus Borton 04/22/2018, 3:26 PM  Maxcine Ham Zelta Enfield, M.A. CCC-SLP Acute Herbalist [phone] Office [phone]

## 2018-04-22 NOTE — Evaluation (Signed)
Clinical/Bedside Swallow Evaluation Patient Details  Name: Luis Johnson MRN: [id number] Date of Birth: 1955-12-24  Today's Date: 04/22/2018 Time: SLP Start Time (ACUTE ONLY): 1418 SLP Stop Time (ACUTE ONLY): 1430 SLP Time Calculation (min) (ACUTE ONLY): 12 min  Past Medical History:  Past Medical History:  Diagnosis Date  . Hypertension   . Stroke [name])    Past Surgical History:  Past Surgical History:  Procedure Laterality Date  . ARTERY BIOPSY Bilateral 06/03/2017   Procedure: BILATERAL TEMPORAL ARTERY BIOPSY;  Surgeon: Luis Hertz, MD;  Location: [facility] OR;  Service: Vascular;  Laterality: Bilateral;  . IR 3D INDEPENDENT WKST  06/02/2017  . IR ANGIO INTRA EXTRACRAN SEL COM CAROTID INNOMINATE BILAT MOD SED  06/02/2017  . IR ANGIO VERTEBRAL SEL VERTEBRAL BILAT MOD SED  06/02/2017  . SHOULDER SURGERY     HPI:  63 yo admitted with decreased responsiveness from home with large parenchymal intracerebral hemorrhage in the right parietotemporal area. INtubated for airway protection 2/2-2/7. PMhx: HTN, ICH, CVA, amyloid beta related angiitis   Assessment / Plan / Recommendation Clinical Impression  Pt has signs of a neurologically-based dysphagia likely exacerbated by post-extubation changes. His voice is hoarse, soft, and audible wet at baseline, and he does not follow commands to cough and clear secretions. He has secretions pooling in his oral cavity, seemingly without awareness, that spill out of his R side as he keeps his head tilted down and to the R. Limited labial and lingual movements are observed upon presentation of ice chips and immediate coughing follows. Intermittent hyolaryngeal movement is felt, although positioning of his head does limit ability to palpate. Recommend NPO and leaving Cortrak in place. Will continue to follow for readiness to participate in instrumental testing. SLP Visit Diagnosis: Dysphagia, unspecified (R13.10)    Aspiration Risk  Moderate aspiration risk;Severe  aspiration risk    Diet Recommendation NPO;Alternative means - temporary   Medication Administration: Via alternative means    Other  Recommendations Oral Care Recommendations: Oral care QID Other Recommendations: Have oral suction available   Follow up Recommendations Inpatient Rehab      Frequency and Duration min 2x/week  2 weeks       Prognosis Prognosis for Safe Diet Advancement: Good Barriers to Reach Goals: Severity of deficits;Cognitive deficits      Swallow Study   General HPI: 63 yo admitted with decreased responsiveness from home with large parenchymal intracerebral hemorrhage in the right parietotemporal area. INtubated for airway protection 2/2-2/7. PMhx: HTN, ICH, CVA, amyloid beta related angiitis Type of Study: Bedside Swallow Evaluation Previous Swallow Assessment: none in chart Diet Prior to this Study: NPO;NG Tube Temperature Spikes Noted: Yes(102.7) Respiratory Status: Nasal cannula History of Recent Intubation: Yes Length of Intubations (days): 6 days Date extubated: 04/21/18 Behavior/Cognition: Alert;Requires cueing Oral Cavity Assessment: Excessive secretions Oral Care Completed by SLP: Yes Oral Cavity - Dentition: (difficult to assess) Self-Feeding Abilities: Total assist Patient Positioning: Upright in bed(head tilted down and to the R) Baseline Vocal Quality: Hoarse;Low vocal intensity;Wet Volitional Cough: Cognitively unable to elicit    Oral/Motor/Sensory Function Overall Oral Motor/Sensory Function: (difficulty following commands to assess)   Ice Chips Ice chips: Impaired Presentation: Spoon Oral Phase Impairments: Reduced labial seal;Poor awareness of bolus Pharyngeal Phase Impairments: Wet Vocal Quality;Cough - Immediate   Thin Liquid Thin Liquid: Not tested    Nectar Thick Nectar Thick Liquid: Not tested   Honey Thick Honey Thick Liquid: Not tested   Puree Puree: Not tested   Solid  Solid: Not tested      Luis Johnson  Luis Johnson 04/22/2018,2:59 PM   Luis Johnson, M.A. CCC-SLP Acute Herbalist [phone] Office [phone]

## 2018-04-22 NOTE — Progress Notes (Signed)
NAME:  Luis Johnson, MRN:  [id number], DOB:  1955-06-19, LOS: 6 ADMISSION DATE:  04/16/2018, CONSULTATION DATE:  04/17/2018 REFERRING MD:  Cherly BeachSethi - [facility], CHIEF COMPLAINT:  AMS   HPI/course in hospital  Luis Johnson is a 63 y.o. male with amyloid angiopathy who was admitted 2/2 with ICH and required intubation for airway protection.  Past Medical History   Past Medical History:  Diagnosis Date  . Hypertension   . Stroke [facility](HCC)      Past Surgical History:  Procedure Laterality Date  . ARTERY BIOPSY Bilateral 06/03/2017   Procedure: BILATERAL TEMPORAL ARTERY BIOPSY;  Surgeon: Fransisco Hertzhen, Brian L, MD;  [facility];  Service: Vascular;  Laterality: Bilateral;  . IR 3D INDEPENDENT WKST  06/02/2017  . IR ANGIO INTRA EXTRACRAN SEL COM CAROTID INNOMINATE BILAT MOD SED  06/02/2017  . IR ANGIO VERTEBRAL SEL VERTEBRAL BILAT MOD SED  06/02/2017  . SHOULDER SURGERY      Interim history/subjective:  No events overnight, tolerated extubation  Objective   Blood pressure (!) 153/77, pulse (!) 115, temperature 99.5 F (37.5 C), temperature source Oral, resp. rate (!) 23, height 5\' 7"  (1.702 m), weight 71.1 kg, SpO2 96 %.        Intake/Output Summary (Last 24 hours) at 04/22/2018 1034 Last data filed at 04/22/2018 0945 Gross per 24 hour  Intake 2760.47 ml  Output 3475 ml  Net -714.53 ml   Filed Weights   04/18/18 0500 04/20/18 0444 04/21/18 0500  Weight: 70.3 kg 71 kg 71.1 kg    Examination: General:  Chronically ill appearing male, NAD, left sided neglect Neuro: Awake and interactive on the right  HEENT:  Patrick/AT, PERRL, EOM-I and MMM Cardiovascular:  RRR, Nl S1/S2 and -M/R/G Lungs:  Crackles noted bibasilarly Abdomen:  Soft, NT, ND and +BS Musculoskeletal:  -edema and -tenderness Skin:  Intact, MMM  I reviewed CXR myself, no acute disease noted  Ancillary tests   CBC: Recent Labs  Lab 04/16/18 0739  04/18/18 0409 04/19/18 0557 04/20/18 1447 04/21/18 0425 04/22/18 0647  WBC 9.1  --   11.7* 12.5* 11.3* 10.4 8.5  NEUTROABS 8.0*  --   --   --   --   --  7.9*  HGB 15.3   < > 14.0 13.8 11.8* 12.2* 12.3*  HCT 50.0   < > 46.0 44.7 39.1 39.8 40.4  MCV 84.9  --  86.6 86.3 85.9 84.7 84.0  PLT 167  --  219 193 107* 107* 133*   < > = values in this interval not displayed.    Basic Metabolic Panel: Recent Labs  Lab 04/17/18 1811  04/18/18 0610  04/18/18 1700  04/19/18 0557  04/20/18 1614 04/20/18 2213 04/21/18 0425 04/21/18 1225 04/22/18 0647  NA 150*   < > 151*   < >  --    < > 159*   < > 159*  159* 158* 160* 160* 152*  K  --   --  5.0  --   --   --  3.0*  --  3.8  --  3.6  --  3.9  CL  --   --  121*  --   --   --  124*  --  122*  --  125*  --  122*  CO2  --   --  24  --   --   --  25  --  24  --  25  --  24  GLUCOSE  --   --  147*  --   --   --  142*  --  109*  --  144*  --  167*  BUN  --   --  13  --   --   --  13  --  56*  --  46*  --  29*  CREATININE  --   --  0.77  --   --   --  0.83  --  2.81*  --  1.53*  --  1.04  CALCIUM  --   --  8.6*  --   --   --  9.2  --  8.5*  --  8.8*  --  9.4  MG 2.5*  --  2.3  --  2.8*  --  2.2  --   --   --   --   --   --   PHOS 2.5  --  2.0*  --  2.1*  --  3.1  --   --   --   --   --   --    < > = values in this interval not displayed.   GFR: Estimated Creatinine Clearance: 67.1 mL/min (by C-G formula based on SCr of 1.04 mg/dL). Recent Labs  Lab 04/19/18 0557 04/20/18 1447 04/21/18 0425 04/22/18 0647  WBC 12.5* 11.3* 10.4 8.5    Liver Function Tests: Recent Labs  Lab 04/16/18 0739  AST 27  ALT 14  ALKPHOS 44  BILITOT 1.4*  PROT 8.6*  ALBUMIN 4.1   No results for input(s): LIPASE, AMYLASE in the last 168 hours. No results for input(s): AMMONIA in the last 168 hours.  ABG    Component Value Date/Time   PHART 7.376 04/16/2018 1053   PCO2ART 48.2 (H) 04/16/2018 1053   PO2ART 372.0 (H) 04/16/2018 1053   HCO3 28.3 (H) 04/16/2018 1053   TCO2 30 04/16/2018 1053   O2SAT 100.0 04/16/2018 1053     Coagulation  Profile: Recent Labs  Lab 04/16/18 0739  INR 0.99    Cardiac Enzymes: No results for input(s): CKTOTAL, CKMB, CKMBINDEX, TROPONINI in the last 168 hours.  HbA1C: Hgb A1c MFr Bld  Date/Time Value Ref Range Status  04/22/2018 06:47 AM 6.1 (H) 4.8 - 5.6 % Final    Comment:    (NOTE) Pre diabetes:          5.7%-6.4% Diabetes:              >6.4% Glycemic control for   <7.0% adults with diabetes   06/01/2017 05:51 AM 5.5 4.8 - 5.6 % Final    Comment:    (NOTE) Pre diabetes:          5.7%-6.4% Diabetes:              >6.4% Glycemic control for   <7.0% adults with diabetes     CBG: Recent Labs  Lab 04/21/18 1545 04/21/18 1952 04/21/18 2307 04/22/18 0313 04/22/18 0752  GLUCAP 149* 124* 155* 207* 146*    Assessment & Plan:   ICH secondary to amyloid angiopathy: Complicated by cerebral edema. S/p 3% NaCl infusion stopped 2/5. - Stroke service following - Na dropping slowly  Acute respiratory failure requiring mechanical ventilation - Titrate O2 for sat of 88-92% - Monitor closely for airway protection - SLP ordered - IS  Hypertensive crisis: Clevidipine now off - SBP goal < 160 mmHg - Continue hydralazine, verapamil.  - Add PO labetalol in an attempt to d/c IV HTN drip  Acute Kidney injury: likely  due to ACEi and dehydration. Improving - KVO IVF - Low dose lasix for pulmonary edema - Hold ACEi for now.   Thrombocytopenia: mild and appears to be stabilizing.  - Follow for now.  Hold in the ICU for IV drips for BP control.  Start PO medications and will attempt to titrate down.  PCCM will continue to follow.  The patient is critically ill with multiple organ systems failure and requires high complexity decision making for assessment and support, frequent evaluation and titration of therapies, application of advanced monitoring technologies and extensive interpretation of multiple databases.   Critical Care Time devoted to patient care services described in  this note is  31  Minutes. This time reflects time of care of this signee Dr Koren BoundWesam Bentzion Dauria. This critical care time does not reflect procedure time, or teaching time or supervisory time of PA/NP/Med student/Med Resident etc but could involve care discussion time.  Alyson ReedyWesam G. Harly Pipkins, M.D. [facility]. Pager: [phone]. After hours pager: [phone].

## 2018-04-23 ENCOUNTER — Inpatient Hospital Stay (HOSPITAL_COMMUNITY): Payer: Medicare Other

## 2018-04-23 DIAGNOSIS — I5031 Acute diastolic (congestive) heart failure: Secondary | ICD-10-CM

## 2018-04-23 DIAGNOSIS — I10 Essential (primary) hypertension: Secondary | ICD-10-CM

## 2018-04-23 LAB — GLUCOSE, CAPILLARY
GLUCOSE-CAPILLARY: 120 mg/dL — AB (ref 70–99)
Glucose-Capillary: 171 mg/dL — ABNORMAL HIGH (ref 70–99)
Glucose-Capillary: 156 mg/dL — ABNORMAL HIGH (ref 70–99)
Glucose-Capillary: 162 mg/dL — ABNORMAL HIGH (ref 70–99)
Glucose-Capillary: 152 mg/dL — ABNORMAL HIGH (ref 70–99)
Glucose-Capillary: 147 mg/dL — ABNORMAL HIGH (ref 70–99)

## 2018-04-23 LAB — CBC WITH DIFFERENTIAL/PLATELET
ABS IMMATURE GRANULOCYTES: 0.04 10*3/uL (ref 0.00–0.07)
BASOS PCT: 0 %
Basophils Absolute: 0 10*3/uL (ref 0.0–0.1)
EOS ABS: 0.2 10*3/uL (ref 0.0–0.5)
Eosinophils Relative: 2 %
HCT: 38.7 % — ABNORMAL LOW (ref 39.0–52.0)
Hemoglobin: 12.4 g/dL — ABNORMAL LOW (ref 13.0–17.0)
Immature Granulocytes: 0 %
Lymphocytes Relative: 5 %
Lymphs Abs: 0.5 10*3/uL — ABNORMAL LOW (ref 0.7–4.0)
MCH: 26.6 pg (ref 26.0–34.0)
MCHC: 32 g/dL (ref 30.0–36.0)
MCV: 82.9 fL (ref 80.0–100.0)
Monocytes Absolute: 0.4 10*3/uL (ref 0.1–1.0)
Monocytes Relative: 4 %
NEUTROS PCT: 89 %
Neutro Abs: 9 10*3/uL — ABNORMAL HIGH (ref 1.7–7.7)
Platelets: 167 10*3/uL (ref 150–400)
RBC: 4.67 MIL/uL (ref 4.22–5.81)
RDW: 16.1 % — ABNORMAL HIGH (ref 11.5–15.5)
WBC: 10.1 10*3/uL (ref 4.0–10.5)
nRBC: 0.2 % (ref 0.0–0.2)

## 2018-04-23 LAB — BASIC METABOLIC PANEL
Anion gap: 14 (ref 5–15)
BUN: 63 mg/dL — ABNORMAL HIGH (ref 8–23)
CALCIUM: 9.5 mg/dL (ref 8.9–10.3)
CO2: 23 mmol/L (ref 22–32)
Chloride: 116 mmol/L — ABNORMAL HIGH (ref 98–111)
Creatinine, Ser: 2.05 mg/dL — ABNORMAL HIGH (ref 0.61–1.24)
Glucose, Bld: 172 mg/dL — ABNORMAL HIGH (ref 70–99)
Potassium: 4 mmol/L (ref 3.5–5.1)
Sodium: 153 mmol/L — ABNORMAL HIGH (ref 135–145)

## 2018-04-23 LAB — MAGNESIUM: Magnesium: 2.7 mg/dL — ABNORMAL HIGH (ref 1.7–2.4)

## 2018-04-23 LAB — PHOSPHORUS: Phosphorus: 3.9 mg/dL (ref 2.5–4.6)

## 2018-04-23 MED ORDER — SODIUM CHLORIDE 0.9% FLUSH: 10.0000 mL | INTRAVENOUS | Status: DC | PRN

## 2018-04-23 MED ORDER — FREE WATER
200.0000 mL | Freq: Four times a day (QID) | Status: DC
  Administered 2018-04-23 – 2018-04-24 (×5): 200 mL

## 2018-04-23 MED ORDER — LABETALOL HCL 5 MG/ML IV SOLN: 10.0000 mg | INTRAVENOUS | Status: DC | PRN

## 2018-04-23 MED ORDER — SODIUM CHLORIDE 0.9% FLUSH
10.0000 mL | Freq: Two times a day (BID) | INTRAVENOUS | Status: DC
  Administered 2018-04-23 – 2018-04-24 (×2): 10 mL

## 2018-04-23 NOTE — Progress Notes (Signed)
NAME:  Luis Johnson, MRN:  [id number], DOB:  [birth year], LOS: 7 ADMISSION DATE:  04/16/2018, CONSULTATION DATE:  04/17/2018 REFERRING MD:  Cherly Beach, CHIEF COMPLAINT:  AMS   HPI/course in hospital  Luis Johnson is a 63 y.o. male with amyloid angiopathy who was admitted 2/2 with ICH and required intubation for airway protection.  Past Medical History   Past Medical History:  Diagnosis Date  . Hypertension   . Stroke [facility])      Past Surgical History:  Procedure Laterality Date  . ARTERY BIOPSY Bilateral 06/03/2017   Procedure: BILATERAL TEMPORAL ARTERY BIOPSY;  Surgeon: Fransisco Hertz, MD;  Location: [facility] OR;  Service: Vascular;  Laterality: Bilateral;  . IR 3D INDEPENDENT WKST  06/02/2017  . IR ANGIO INTRA EXTRACRAN SEL COM CAROTID INNOMINATE BILAT MOD SED  06/02/2017  . IR ANGIO VERTEBRAL SEL VERTEBRAL BILAT MOD SED  06/02/2017  . SHOULDER SURGERY      Interim history/subjective:  No events overnight, no new complaints Off HTN drip with BP under control  Objective   Blood pressure 127/83, pulse (!) 101, temperature 98.1 F (36.7 C), temperature source Axillary, resp. rate 17, height 5\' 7"  (1.702 m), weight 70.9 kg, SpO2 100 %.        Intake/Output Summary (Last 24 hours) at 04/23/2018 1200 Last data filed at 04/23/2018 1100 Gross per 24 hour  Intake 1475.03 ml  Output 750 ml  Net 725.03 ml   Filed Weights   04/20/18 0444 04/21/18 0500 04/23/18 0500  Weight: 71 kg 71.1 kg 70.9 kg    Examination: General:  Chronically ill appearing male with left sided neglect Neuro: Awake and interactive on the right  HEENT:  [place]/AT, PERRL, EOM-I and MMM Cardiovascular:  RRR, Nl S1/S2 and -M/R/G Lungs:  CTA bilaterally Abdomen:  Soft, NT, ND and +BS Musculoskeletal:  -edema and -tenderness Skin:  Intact, MMM  I reviewed CXR myself, no acute disease noted  Ancillary tests   CBC: Recent Labs  Lab 04/19/18 0557 04/20/18 1447 04/21/18 0425 04/22/18 0647 04/23/18 0401  WBC 12.5* 11.3*  10.4 8.5 10.1  NEUTROABS  --   --   --  7.9* 9.0*  HGB 13.8 11.8* 12.2* 12.3* 12.4*  HCT 44.7 39.1 39.8 40.4 38.7*  MCV 86.3 85.9 84.7 84.0 82.9  PLT 193 107* 107* 133* 167    Basic Metabolic Panel: Recent Labs  Lab 04/17/18 1811  04/18/18 0610  04/18/18 1700  04/19/18 0557  04/20/18 1614 04/20/18 2213 04/21/18 0425 04/21/18 1225 04/22/18 0647 04/23/18 0401  NA 150*   < > 151*   < >  --    < > 159*   < > 159*  159* 158* 160* 160* 152* 153*  K  --    < > 5.0  --   --   --  3.0*  --  3.8  --  3.6  --  3.9 4.0  CL  --    < > 121*  --   --   --  124*  --  122*  --  125*  --  122* 116*  CO2  --    < > 24  --   --   --  25  --  24  --  25  --  24 23  GLUCOSE  --    < > 147*  --   --   --  142*  --  109*  --  144*  --  167*  172*  BUN  --    < > 13  --   --   --  13  --  56*  --  46*  --  29* 63*  CREATININE  --    < > 0.77  --   --   --  0.83  --  2.81*  --  1.53*  --  1.04 2.05*  CALCIUM  --    < > 8.6*  --   --   --  9.2  --  8.5*  --  8.8*  --  9.4 9.5  MG 2.5*  --  2.3  --  2.8*  --  2.2  --   --   --   --   --   --  2.7*  PHOS 2.5  --  2.0*  --  2.1*  --  3.1  --   --   --   --   --   --  3.9   < > = values in this interval not displayed.   GFR: Estimated Creatinine Clearance: 34 mL/min (A) (by C-G formula based on SCr of 2.05 mg/dL (H)). Recent Labs  Lab 04/20/18 1447 04/21/18 0425 04/22/18 0647 04/23/18 0401  WBC 11.3* 10.4 8.5 10.1    Liver Function Tests: No results for input(s): AST, ALT, ALKPHOS, BILITOT, PROT, ALBUMIN in the last 168 hours. No results for input(s): LIPASE, AMYLASE in the last 168 hours. No results for input(s): AMMONIA in the last 168 hours.  ABG    Component Value Date/Time   PHART 7.376 04/16/2018 1053   PCO2ART 48.2 (H) 04/16/2018 1053   PO2ART 372.0 (H) 04/16/2018 1053   HCO3 28.3 (H) 04/16/2018 1053   TCO2 30 04/16/2018 1053   O2SAT 100.0 04/16/2018 1053     Coagulation Profile: No results for input(s): INR, PROTIME in the last  168 hours.  Cardiac Enzymes: No results for input(s): CKTOTAL, CKMB, CKMBINDEX, TROPONINI in the last 168 hours.  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 S/p 3% NaCl infusion stopped 2/5. - Stroke service following - Na dropping slowly, will allow to drift down  Acute respiratory failure requiring mechanical ventilation - Titrate O2 for sat of 88-92% - Monitor closely for airway protection - SLP per speech pathology - IS - D/C zosyn, no signs of infection  Hypertensive crisis:  - SBP goal <160 - Continue hydralazine, verapamil.  - Continue PO labetalol  Acute Kidney injury: likely due to ACEi and dehydration. Improving - KVO IVF - Low dose lasix for pulmonary edema - Hold ACEi for now.  Thrombocytopenia: mild and appears to be stabilizing.  - Follow for now.  PCCM will sign off, please call back if needed.  Alyson Reedy, M.D. [facility] Pulmonary/Critical Care Medicine. Pager: [phone]. After hours pager: [phone].

## 2018-04-23 NOTE — Progress Notes (Signed)
STROKE TEAM PROGRESS NOTE   INTERVAL HISTORY Pt wife is at bedside. Pt awake alert, orientation x2.  Oral secretion much improved.  Chest x-ray stable.  Creatinine elevated, likely due to dehydration.  Will start free water.  Left arm and leg strength improved.  Vitals:   04/23/18 0545 04/23/18 0600 04/23/18 0615 04/23/18 0630  BP: 119/83 113/79 116/73 115/85  Pulse: (!) 108 (!) 111 (!) 104 (!) 108  Resp: 18 (!) 27 (!) 25 (!) 22  Temp:      TempSrc:      SpO2: 100% 100% 100% 100%  Weight:      Height:        CBC:  Recent Labs  Lab 04/22/18 0647 04/23/18 0401  WBC 8.5 10.1  NEUTROABS 7.9* 9.0*  HGB 12.3* 12.4*  HCT 40.4 38.7*  MCV 84.0 82.9  PLT 133* 167    Basic Metabolic Panel:  Recent Labs  Lab 04/19/18 0557  04/22/18 0647 04/23/18 0401  NA 159*   < > 152* 153*  K 3.0*   < > 3.9 4.0  CL 124*   < > 122* 116*  CO2 25   < > 24 23  GLUCOSE 142*   < > 167* 172*  BUN 13   < > 29* 63*  CREATININE 0.83   < > 1.04 2.05*  CALCIUM 9.2   < > 9.4 9.5  MG 2.2  --   --  2.7*  PHOS 3.1  --   --  3.9   < > = values in this interval not displayed.   Lipid Panel:     Component Value Date/Time   CHOL 217 (H) 04/22/2018 0647   TRIG 148 04/22/2018 0647   HDL 33 (L) 04/22/2018 0647   CHOLHDL 6.6 04/22/2018 0647   VLDL 30 04/22/2018 0647   LDLCALC 154 (H) 04/22/2018 0647   HgbA1c:  Lab Results  Component Value Date   HGBA1C 6.1 (H) 04/22/2018   Urine Drug Screen:     Component Value Date/Time   LABOPIA NONE DETECTED 04/21/2018 0245   COCAINSCRNUR NONE DETECTED 04/21/2018 0245   LABBENZ NONE DETECTED 04/21/2018 0245   AMPHETMU NONE DETECTED 04/21/2018 0245   THCU NONE DETECTED 04/21/2018 0245   LABBARB NONE DETECTED 04/21/2018 0245    Alcohol Level     Component Value Date/Time   ETH <10 12/21/2017 1644    IMAGING  Ct Johnson Wo Contrast 04/19/2018 IMPRESSION:  1. Interval decrease in size of right temporal occipital intraparenchymal hemorrhage, estimated  volume 96 cc on today's exam, previously 123 cc on 04/16/2018.  2. Slightly improved associated edema and regional mass effect. Right-to-left midline shift now measures 5 mm, previously 7 mm.  3. No other new acute intracranial abnormality.   Ct Johnson Wo Contrast 04/16/2018 IMPRESSION:  1. Stable large posterior right hemisphere intra-axial hemorrhage since 0721 hours today, which might be the sequelae of an amyloid angiopathy. Estimated blood volume 123 mL, with no intraventricular or extra-axial extension.  2. Stable associated mass effect with leftward midline shift of 7 mm and subtotal effacement of the right lateral ventricle.  3. No new intracranial abnormality.   Mr Luis Johnson Wo Contrast 04/18/2018 IMPRESSION:   MRI Johnson:  1. Acute to subacute large RIGHT temporal occipital hematoma with 5 mm RIGHT-to-LEFT midline shift. No ventricular entrapment.  2. Extensive chronic microhemorrhages in advanced chronic small vessel ischemic changes seen with amyloid angiopathy/angitis.  3. Old LEFT occipital hemorrhage.   MRA Johnson:  1. No emergent large vessel occlusion or flow-limiting stenosis. Mild stenosis RIGHT M1 and LEFT P2 segment.  2. 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   Ct Johnson Code Stroke Wo Contrast 04/16/2018 IMPRESSION:  1. Large acute RIGHT hemisphere multilobar intracerebral hemorrhage, consistent with complication of cerebral amyloid angiopathy. Approximate volume 112 mL. Significant RIGHT-to-LEFT shift of 8 mm with early uncal herniation.  2. ASPECTS is not applicable.     PHYSICAL EXAM  Temp:  [98.6 F (37 C)-99.5 F (37.5 C)] 98.6 F (37 C) (02/09 0358) Pulse Rate:  [93-121] 108 (02/09 0630) Resp:  [12-35] 22 (02/09 0630) BP: (102-173)/(62-133) 115/85 (02/09 0630) SpO2:  [92 %-100 %] 100 % (02/09 0630) Weight:  [70.9 kg] 70.9 kg (02/09 0500)  General - Well nourished, well developed, disorientated to place.  Ophthalmologic - fundi not visualized due  to noncooperation.  Cardiovascular - Regular rate and rhythm, tachycardia.  Neuro - awake alert, orientated to self, age, people, but not to place, eyes open, able to follow simple commands. Able to repeat simple sentences, but not able to name. Moderate dysarthria, paucity of speech, mild perseveration. Eyes in right gaze preference, not crossing midline, blinking to visual threat on the right, but not to the left, tracking objects on the right visual field, PERRL. Left facial droop.  Tongue midline in mouth. Right UE and LE at least 4/5. LUE proximal 0/5, bicep 3-/5, finger movement 3/5 and LLE proximal slight withdraw to pain, distally 3/5 with toe movement. DTR 1+ and no babinski. Sensation symmetrical subjectively, coordination not cooperative and gait not tested.    ASSESSMENT/PLAN [name] is a 63 y.o. 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   ICH: Large right temporoparietal hemorrhage with known CAA and HTN  Code Stroke CT Johnson 04/16/18 large R hemisphere multilobar ICH, vol 112 mL with significant right to left shift 8 mm and uncal herniation.   CT Johnson repeat stable large right ICH, vol 123 mL. Stable right to left shift 7 mm.    MRI  Large right temporal occipital hematoma with midline shift no hydrocephalus. Extensive chronic microhemorrhages. Old left occipital hemorrhage.  MRA  No large vessel stenosis or occlusion. 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  2D Echo EF >65%.   LDL 103  HgbA1c 5.5  Heparin subcu for VTE prophylaxis  aspirin 81 mg daily prior to admission, now on No antithrombotic given hemorrhage and CAA.  Therapy recommendations: CIR  Disposition: Pending  History of stroke and CAA  10/2014 admitted for left sided numbness, blurry vision, headache.  CT showed left occipital ICH, MRI showed left occipital ICH with right occipital old ICH.  MRA no LVO.  Carotid Doppler  and 2D echo negative.  LDL 231 and A1c 6.1  05/2017 admitted for transient bilateral vision loss and headache.  CT and MRI showed right occipital SAH.  Clinical course consistent with CAA  Respiratory distress  Intubated on admission  Extubated 04/21/2018  Patient tolerating well  Improved secretion  Chest x-ray showed stable vascular congestion status post lasix IV 40   Cytotoxic cerebral edema  Induced hypernatremia  CT shows 7 to 8 mm shift and uncal herniation  On 3% protocol to decrease cerebral edema, now off  Sodium currently [phone]  Allow sodium slowly drift down  On tube feeding and free water  CT repeat in a.m.  Dysphagia   Secondary to ICH  N.p.o. for now  On core track with tube feeding @ 50 and now free water  Speech on board  Febrile and leukocytosis  Tmax 102.7 ->101.4-> afebrile  WBC 10.4->8.5-> 10.1  Zosyn started 04/22/2018  Tylenol PRN  Hypertensive emergency  Currently off Cleviprex  BP stable on lower end  Continue labetalol 200, and verapamil  D/C hydralazine due to tachycardia  Hold off ACE inhibitor due to AKI  BP goal < 160   Hyperlipidemia  Home meds: Pravachol 80  LDL 103  Hold statin given ICH  Consider resumption of statin at discharge  Other Stroke Risk Factors  Former cigarette smoker, quit 24 years ago   Family hx stroke (brother, Mother had Alzheimer's and died of ICH)  Other issues  AKI -creatinine 2.81-1.53-1.04-2.08 -start free water  Tachycardia - D/C hydralazine  Hospital day # 7  This patient is critically ill and at significant risk of neurological worsening, death and care requires constant monitoring of vital signs, hemodynamics,respiratory and cardiac monitoring, extensive review of multiple databases, frequent neurological assessment, discussion with family, other specialists and medical decision making of high complexity. I spent 35 minutes of neurocritical care time  in the care of   this patient. I had long discussion with patient and wife at bedside, updated pt current condition, treatment plan and potential prognosis. They expressed understanding and appreciation.   Marvel PlanJindong Tiaunna Buford, MD PhD Stroke Neurology 04/23/2018 11:21 AM   To contact Stroke Continuity provider, please refer to [email or web address]. After hours, contact General Neurology

## 2018-04-23 NOTE — Progress Notes (Signed)
Report given to [facility]. Pt transferred to 3w11. Assessment stable.

## 2018-04-23 NOTE — Progress Notes (Signed)
Pt transferred to 3W11 from 4N. Safety precautions and orders reviewed. TELE applied and confirmed. No other distress voice. Will continue to monitor.   Christ Fullenwider,RN

## 2018-04-24 ENCOUNTER — Inpatient Hospital Stay (HOSPITAL_COMMUNITY): Payer: Medicare Other

## 2018-04-24 DIAGNOSIS — Z8679 Personal history of other diseases of the circulatory system: Secondary | ICD-10-CM

## 2018-04-24 LAB — GLUCOSE, CAPILLARY
Glucose-Capillary: 103 mg/dL — ABNORMAL HIGH (ref 70–99)
Glucose-Capillary: 111 mg/dL — ABNORMAL HIGH (ref 70–99)
Glucose-Capillary: 113 mg/dL — ABNORMAL HIGH (ref 70–99)
Glucose-Capillary: 114 mg/dL — ABNORMAL HIGH (ref 70–99)
Glucose-Capillary: 168 mg/dL — ABNORMAL HIGH (ref 70–99)
Glucose-Capillary: 182 mg/dL — ABNORMAL HIGH (ref 70–99)

## 2018-04-24 LAB — BASIC METABOLIC PANEL
ANION GAP: 9 (ref 5–15)
BUN: 67 mg/dL — ABNORMAL HIGH (ref 8–23)
CALCIUM: 8.9 mg/dL (ref 8.9–10.3)
CO2: 24 mmol/L (ref 22–32)
Chloride: 119 mmol/L — ABNORMAL HIGH (ref 98–111)
Creatinine, Ser: 1.45 mg/dL — ABNORMAL HIGH (ref 0.61–1.24)
GFR calc non Af Amer: 51 mL/min — ABNORMAL LOW (ref >60)
GFR, EST AFRICAN AMERICAN: 59 mL/min — AB (ref >60)
Glucose, Bld: 194 mg/dL — ABNORMAL HIGH (ref 70–99)
Potassium: 3.2 mmol/L — ABNORMAL LOW (ref 3.5–5.1)
Sodium: 152 mmol/L — ABNORMAL HIGH (ref 135–145)

## 2018-04-24 LAB — CBC WITH DIFFERENTIAL/PLATELET
Abs Immature Granulocytes: 0.04 10*3/uL (ref 0.00–0.07)
Basophils Absolute: 0 10*3/uL (ref 0.0–0.1)
Basophils Relative: 0 %
EOS ABS: 0.1 10*3/uL (ref 0.0–0.5)
Eosinophils Relative: 1 %
HEMATOCRIT: 36.7 % — AB (ref 39.0–52.0)
Hemoglobin: 11.8 g/dL — ABNORMAL LOW (ref 13.0–17.0)
Immature Granulocytes: 1 %
Lymphocytes Relative: 14 %
Lymphs Abs: 1.1 10*3/uL (ref 0.7–4.0)
MCH: 26.6 pg (ref 26.0–34.0)
MCHC: 32.2 g/dL (ref 30.0–36.0)
MCV: 82.8 fL (ref 80.0–100.0)
Monocytes Absolute: 0.8 10*3/uL (ref 0.1–1.0)
Monocytes Relative: 9 %
NEUTROS PCT: 75 %
Neutro Abs: 6.2 10*3/uL (ref 1.7–7.7)
Platelets: 169 10*3/uL (ref 150–400)
RBC: 4.43 MIL/uL (ref 4.22–5.81)
RDW: 15.9 % — AB (ref 11.5–15.5)
WBC: 8.2 10*3/uL (ref 4.0–10.5)
nRBC: 0.2 % (ref 0.0–0.2)

## 2018-04-24 LAB — TRIGLYCERIDES: Triglycerides: 189 mg/dL — ABNORMAL HIGH (ref <150)

## 2018-04-24 MED ORDER — SENNOSIDES-DOCUSATE SODIUM 8.6-50 MG PO TABS
1.0000 | ORAL_TABLET | Freq: Two times a day (BID) | ORAL | Status: DC
  Administered 2018-04-26 – 2018-04-27 (×2): 1 via ORAL
  Filled 2018-04-24 (×5): qty 1

## 2018-04-24 MED ORDER — QUETIAPINE FUMARATE 25 MG PO TABS
12.5000 mg | ORAL_TABLET | Freq: Every day | ORAL | Status: DC
  Administered 2018-04-24 – 2018-04-26 (×3): 12.5 mg via ORAL
  Filled 2018-04-24 (×3): qty 1

## 2018-04-24 MED ORDER — QUETIAPINE FUMARATE 25 MG PO TABS
25.0000 mg | ORAL_TABLET | Freq: Once | ORAL | Status: AC
  Administered 2018-04-24: 25 mg via NASOGASTRIC
  Filled 2018-04-24: qty 1

## 2018-04-24 MED ORDER — VERAPAMIL HCL 40 MG PO TABS
40.0000 mg | ORAL_TABLET | Freq: Three times a day (TID) | ORAL | Status: DC
  Administered 2018-04-24 – 2018-04-27 (×7): 40 mg via ORAL
  Filled 2018-04-24 (×9): qty 1

## 2018-04-24 MED ORDER — ENSURE ENLIVE PO LIQD
237.0000 mL | Freq: Two times a day (BID) | ORAL | Status: DC
  Administered 2018-04-24 – 2018-04-27 (×5): 237 mL via ORAL

## 2018-04-24 MED ORDER — QUETIAPINE FUMARATE 25 MG PO TABS: 12.5000 mg | ORAL_TABLET | Freq: Every day | ORAL | Status: DC

## 2018-04-24 MED ORDER — ADULT MULTIVITAMIN W/MINERALS CH
1.0000 | ORAL_TABLET | Freq: Every day | ORAL | Status: DC
  Administered 2018-04-25 – 2018-04-27 (×3): 1 via ORAL
  Filled 2018-04-24 (×3): qty 1

## 2018-04-24 MED ORDER — PANTOPRAZOLE SODIUM 40 MG PO PACK
40.0000 mg | PACK | Freq: Every day | ORAL | Status: DC
  Administered 2018-04-27: 40 mg via ORAL
  Filled 2018-04-24 (×3): qty 20

## 2018-04-24 NOTE — Progress Notes (Signed)
Patient accidentally pulled on the green securement device connected to the cortrak to his right nare which has made the cortrak loose. Safety Mitten placed on patient .Dr Wilford Corner made aware.

## 2018-04-24 NOTE — Progress Notes (Signed)
Physical Therapy Treatment Patient Details Name: Luis Johnson MRN: [id number] DOB: 1955/12/04 Today's Date: 04/24/2018    History of Present Illness 63 yo admitted with decreased responsiveness from home with large parenchymal intracerebral hemorrhage in the right parietotemporal area. INtubated for airway protection 2/2-2/7. PMhx: HTN, ICH, CVA, amyloid beta related angiitis    PT Comments    Patient more lethargic this pm and limited participation, RN informed due to Seroquel earlier with agitation.  Wife in the room and attempting to feed pt as not on a diet.  Educated to not feed if sleepy and placed suction close if pt not swallowing.  PT to follow, remains appropriate for CIR.   Follow Up Recommendations  CIR;Supervision/Assistance - 24 hour     Equipment Recommendations  Wheelchair (measurements PT);Wheelchair cushion (measurements PT)    Recommendations for Other Services       Precautions / Restrictions Precautions Precautions: Fall Precaution Comments: cortrak, right gaze, left HP, SBP <160    Mobility  Bed Mobility Overal bed mobility: Needs Assistance Bed Mobility: Supine to Sit Rolling: Max assist;+2 for physical assistance Sidelying to sit: +2 for physical assistance;Max assist Supine to sit: Max assist     General bed mobility comments: too sleepy to assist much  Transfers Overall transfer level: Needs assistance Equipment used: 1 person hand held assist Transfers: Lateral/Scoot Transfers Sit to Stand: Max assist        Lateral/Scoot Transfers: Total assist;+2 physical assistance General transfer comment: attempted stand pivot, but pt not helping so scoot pivot to drop arm recliner, RN informed will need lift for back to bed  Ambulation/Gait                 Stairs             Wheelchair Mobility    Modified Rankin (Stroke Patients Only) Modified Rankin (Stroke Patients Only) Pre-Morbid Rankin Score: No symptoms Modified Rankin:  Severe disability     Balance Overall balance assessment: Needs assistance Sitting-balance support: Feet supported Sitting balance-Leahy Scale: Zero Sitting balance - Comments: L lateral lean in sitting EOB   Standing balance support: Single extremity supported Standing balance-Leahy Scale: Poor Standing balance comment: unable to stand today                            Cognition Arousal/Alertness: Lethargic Behavior During Therapy: Flat affect Overall Cognitive Status: Impaired/Different from baseline Area of Impairment: Orientation;Attention;Memory;Following commands;Safety/judgement;Problem solving                 Orientation Level: Disoriented to;Time Current Attention Level: Sustained;Focused Memory: Decreased short-term memory Following Commands: Follows one step commands inconsistently;Follows one step commands with increased time Safety/Judgement: Decreased awareness of deficits;Decreased awareness of safety   Problem Solving: Slow processing;Decreased initiation;Difficulty sequencing;Requires verbal cues;Requires tactile cues General Comments: sleepy due to RN reports having Seroquel earlier due to agitation      Exercises      General Comments General comments (skin integrity, edema, etc.): wife present.  explained current deficits, effects of CVA, and need for post acute rehab.       Pertinent Vitals/Pain Pain Assessment: No/denies pain    Home Living Family/patient expects to be discharged to:: Private residence Living Arrangements: Spouse/significant other;Children Available Help at Discharge: Family;Available 24 hours/day Type of Home: House Home Access: Stairs to enter Entrance Stairs-Rails: Right;Left;Can reach both Home Layout: One level Home Equipment: Cane - single point Additional Comments: Wife is a Lawyer  at [employer] [place]     Prior Function Level of Independence: Needs assistance  Gait / Transfers Assistance Needed: Pt uses SPC for  ambulation in the community.  Uses no AD in the house  ADL's / Homemaking Assistance Needed: Pt was independent with ADLs, driving, and is a Programmer, multimedia  Comments: wife during eval and confirmed PLOF    PT Goals (current goals can now be found in the care plan section) Acute Rehab PT Goals Patient Stated Goal: to walk to bathroom  Progress towards PT goals: Not progressing toward goals - comment(lethargy due to meds)    Frequency    Min 4X/week      PT Plan Current plan remains appropriate    Co-evaluation              AM-PAC PT "6 Clicks" Mobility   Outcome Measure  Help needed turning from your back to your side while in a flat bed without using bedrails?: Total Help needed moving from lying on your back to sitting on the side of a flat bed without using bedrails?: Total Help needed moving to and from a bed to a chair (including a wheelchair)?: Total Help needed standing up from a chair using your arms (e.g., wheelchair or bedside chair)?: Total Help needed to walk in hospital room?: Total Help needed climbing 3-5 steps with a railing? : Total 6 Click Score: 6    End of Session Equipment Utilized During Treatment: Gait belt Activity Tolerance: Patient limited by fatigue Patient left: in chair;with family/visitor present   PT Visit Diagnosis: Unsteadiness on feet (R26.81);Muscle weakness (generalized) (M62.81);Other abnormalities of gait and mobility (R26.89);Other symptoms and signs involving the nervous system (R29.898)     Time: 7494-4967 PT Time Calculation (min) (ACUTE ONLY): 18 min  Charges:  $Therapeutic Activity: 8-22 mins                     Luis Johnson, [place] [facility] [phone] 04/24/2018    Luis Johnson 04/24/2018, 3:33 PM

## 2018-04-24 NOTE — Progress Notes (Signed)
Nutrition Follow-up  DOCUMENTATION CODES:   Non-severe (moderate) malnutrition in context of chronic illness  INTERVENTION:   Provide Ensure Enlive po BID, each supplement provides 350 kcal and 20 grams of protein.  May discontinue tube feeding once pt able to tolerate PO with consistent meal completions of >/= 50%.   Encourage adequate PO intake.   NUTRITION DIAGNOSIS:   Moderate Malnutrition related to chronic illness(HTN) as evidenced by mild fat depletion, moderate fat depletion, mild muscle depletion, moderate muscle depletion; ongoing  GOAL:   Patient will meet greater than or equal to 90% of their needs; progressing  MONITOR:   PO intake, Supplement acceptance, Skin, Labs, Weight trends, I & O's, TF tolerance  REASON FOR ASSESSMENT:   Consult, Ventilator Enteral/tube feeding initiation and management  ASSESSMENT:   Pt with PMH of HTN, amyloid angiopathy, and prior ICH admitted 2/2 for large R IPH.   2/5 Post Pyloric Cortrak placed  2/7 Extubated   MBS evaluation this AM. Diet has been advanced to a regular diet with thin liquids. Pt await first meal tray during time of visit. Wife at bedside reports she will encourage PO intake at meals. RD to order Ensure to aid in adequate nutrition. Per MD, plans to continue tube feeding overnight and if pt able to tolerate PO with adequate intake then will discontinue tube feeding tomorrow.   Pt currently has Vital High Protein infusing at rate of 50 ml/hr which provides 1200 kcal (65% of kcal needs) and 105 grams of protein (100% of protein needs). Total free water from tube feeding and free water flushes: 1808 ml. May discontinue tube feeding if pt consistently consumes >/= 50% at meals.   Labs and medications reviewed. Sodium elevated at 152.   Diet Order:   Diet Order            Diet regular Room service appropriate? Yes; Fluid consistency: Thin  Diet effective now              EDUCATION NEEDS:   No education  needs have been identified at this time  Skin:  Skin Assessment: Reviewed RN Assessment  Last BM:  2/10  Height:   Ht Readings from Last 1 Encounters:  04/17/18 5\' 7"  (1.702 m)    Weight:   Wt Readings from Last 1 Encounters:  04/24/18 70.2 kg    Ideal Body Weight:  67.2 kg  BMI:  Body mass index is 24.24 kg/m.  Estimated Nutritional Needs:   Kcal:  1850-2050  Protein:  85-105 grams  Fluid:  1.8 - 2 L/day    Roslyn Smiling, MS, RD, LDN Pager # [phone] After hours/ weekend pager # [phone]

## 2018-04-24 NOTE — Evaluation (Addendum)
Occupational Therapy Evaluation Patient Details Name: Luis Johnson MRN: [id number] DOB: 15-Jan-1956 Today's Date: 04/24/2018    History of Present Illness 63 yo admitted with decreased responsiveness from home with large parenchymal intracerebral hemorrhage in the right parietotemporal area. INtubated for airway protection 2/2-2/7. PMhx: HTN, ICH, CVA, amyloid beta related angiitis   Clinical Impression   Pt admitted with above. He demonstrates the below listed deficits and will benefit from continued OT to maximize safety and independence with BADLs.  Pt presents to OT with Lt hemiparesis, impaired balance, Rt gaze preference with Lt neglect, impaired attention, decreased memory, poor safety awareness, decreased safety and judgement.  He currently requires max A - total A for ADLs and max A for sit to stand.  PTA, he lived with wife (who is CNA at [employer]), and was independent with ADLs, ambulated with [facility] in community and was driving.  He will need post acute rehab.  Recommend CIR, however, wife may prefer SNF leve rehab at [facility].   I have discussed the patient's current level of function related to balance, cognition, vision, ADLs and mobiltiy with the patient and spouse.  They acknowledge understanding of this and do not feel the patient would be able to have their care needs met at home.  They are interested in post-acute rehab in an inpatient setting.        Follow Up Recommendations  CIR;Supervision/Assistance - 24 hour    Equipment Recommendations  None recommended by OT    Recommendations for Other Services Rehab consult     Precautions / Restrictions Precautions Precautions: Fall Precaution Comments: cortrak, right gaze, left HP, SBP <160      Mobility Bed Mobility Overal bed mobility: Needs Assistance Bed Mobility: Supine to Sit     Supine to sit: Max assist     General bed mobility comments: assist to initiate movement, assist to move LEs off the EOB and  to lift trunk.  mod A to safely lower trunk back to bed and max A to lift LEs back onto bed   Transfers Overall transfer level: Needs assistance Equipment used: 1 person hand held assist Transfers: Sit to/from Stand Sit to Stand: Max assist         General transfer comment: Pt required facilitation for forward translation of trunk, assist to facilitate hip and knee extension and to extend trunk.  Pt noted with heavy pushing to the Lt and posteriorly     Balance Overall balance assessment: Needs assistance Sitting-balance support: Feet supported;Single extremity supported;No upper extremity supported Sitting balance-Leahy Scale: Poor Sitting balance - Comments: Pt initially required mod A to maintain EOB sitting with heavy posterior and Lt lean,  he progressed to close min guad assist    Standing balance support: Single extremity supported Standing balance-Leahy Scale: Poor Standing balance comment: Pt requires max A to maintain static standing                            ADL either performed or assessed with clinical judgement   ADL Overall ADL's : Needs assistance/impaired Eating/Feeding: NPO   Grooming: Oral care;Moderate assistance;Sitting Grooming Details (indicate cue type and reason): Pt initially unable to identify toothbrush.  Once he was able to identify toothbrush, he required mod cues to demonstrate appropriate use.  Mod A to perform task due to decreased balance and thoroughness  Upper Body Bathing: Moderate assistance;Sitting   Lower Body Bathing: Maximal assistance;Sit to/from stand  Upper Body Dressing : Maximal assistance;Sitting   Lower Body Dressing: Total assistance;Sit to/from stand   Toilet Transfer: Total assistance Toilet Transfer Details (indicate cue type and reason): unable to safely attempt  Toileting- Clothing Manipulation and Hygiene: Total assistance;Sit to/from stand       Functional mobility during ADLs: Maximal assistance        Vision Baseline Vision/History: Wears glasses Wears Glasses: At all times Patient Visual Report: No change from baseline Additional Comments: Pt unable to attend to formal testing.  He demonstrates Rt gaze preference.  He will turn head to look to wife on his Left, but quickly turns back to the Rt.  He keeps his eyes closed most of the time.  When cued, he will open them, but only partially, and will not look to OT and often confabulates about what he is seeing.   He was unable to initially identfy toothbrush when it was handed to him, but he was able to identify the color.      Perception Perception Perception Tested?: Yes Perception Deficits: Inattention/neglect Inattention/Neglect: Does not attend to left visual field;Does not attend to left side of body Spatial deficits: Rt gaze preference with Lt inattention    Praxis      Pertinent Vitals/Pain Pain Assessment: No/denies pain     Hand Dominance Right   Extremity/Trunk Assessment Upper Extremity Assessment Upper Extremity Assessment: LUE deficits/detail LUE Deficits / Details: Pt with h/o shoulder replacement with resultant limited ROM.  Wife reports he could elevate shoulder to ~75* (based on her demonstration).  Pt currently with only ~20* shoulder flexion.  He demonstrates ~80% hand to mouth actively with Lt UE, and mass grasp and release  LUE Sensation: decreased proprioception(pt left Lt UE behind him when transitioning to sitting ) LUE Coordination: decreased fine motor   Lower Extremity Assessment Lower Extremity Assessment: Defer to PT evaluation   Cervical / Trunk Assessment Cervical / Trunk Assessment: Other exceptions Cervical / Trunk Exceptions: head/neck rotated to the Rt    Communication Communication Communication: No difficulties   Cognition Arousal/Alertness: Awake/alert Behavior During Therapy: Flat affect;Impulsive Overall Cognitive Status: Impaired/Different from baseline Area of Impairment:  Orientation;Attention;Memory;Following commands;Safety/judgement;Problem solving                 Orientation Level: Disoriented to;Time Current Attention Level: Sustained;Focused Memory: Decreased short-term memory Following Commands: Follows one step commands inconsistently;Follows one step commands with increased time Safety/Judgement: Decreased awareness of deficits;Decreased awareness of safety   Problem Solving: Slow processing;Decreased initiation;Difficulty sequencing;Requires verbal cues;Requires tactile cues General Comments: Pt is easily distracted and frequently self distracts.   He repeatedly saw people on his Rt who were not in the room.  He demonstrates poor awareness of deficits and insists if he just had his cane he could walk    General Comments  wife present.  explained current deficits, effects of CVA, and need for post acute rehab.     Exercises     Shoulder Instructions      Home Living Family/patient expects to be discharged to:: Private residence Living Arrangements: Spouse/significant other;Children Available Help at Discharge: Family;Available 24 hours/day Type of Home: House Home Access: Stairs to enter [employer] of Steps: 5 Entrance Stairs-Rails: Right;Left;Can reach both Home Layout: One level     Bathroom Shower/Tub: Teacher, early years/pre: Standard Bathroom Accessibility: Yes   Home Equipment: Kasandra Knudsen - single point   Additional Comments: Wife is a Quarry manager at [employer]       Prior  Functioning/Environment Level of Independence: Needs assistance  Gait / Transfers Assistance Needed: Pt uses SPC for ambulation in the community.  Uses no AD in the house  ADL's / Homemaking Assistance Needed: Pt was independent with ADLs, driving, and is a Environmental education officer    Comments: wife during eval and confirmed PLOF         OT Problem List: Decreased strength;Decreased activity tolerance;Decreased range of motion;Impaired balance (sitting  and/or standing);Impaired vision/perception;Decreased coordination;Decreased cognition;Decreased safety awareness;Decreased knowledge of use of DME or AE;Impaired sensation;Impaired UE functional use      OT Treatment/Interventions: Self-care/ADL training;Neuromuscular education;DME and/or AE instruction;Therapeutic activities;Cognitive remediation/compensation;Visual/perceptual remediation/compensation;Patient/family education;Balance training;Manual therapy    OT Goals(Current goals can be found in the care plan section) Acute Rehab OT Goals Patient Stated Goal: to walk to bathroom  OT Goal Formulation: With patient/family Time For Goal Achievement: 05/08/18 Potential to Achieve Goals: Good ADL Goals Pt Will Perform Grooming: with min assist;sitting Pt Will Perform Upper Body Bathing: with mod assist;sitting Pt Will Perform Lower Body Bathing: with mod assist;sit to/from stand Pt Will Transfer to Toilet: with mod assist;stand pivot transfer;bedside commode Pt Will Perform Toileting - Clothing Manipulation and hygiene: with max assist;sit to/from stand Additional ADL Goal #1: Pt will locate ADL items on Lt with min cues during ADL activities Additional ADL Goal #2: Pt will sustain attention to familiar ADL task x 3 mins with min cues  OT Frequency: Min 2X/week   Barriers to D/C: Decreased caregiver support  wife unable to provide current level of care        Co-evaluation              AM-PAC OT "6 Clicks" Daily Activity     Outcome Measure Help from another person eating meals?: Total Help from another person taking care of personal grooming?: A Lot Help from another person toileting, which includes using toliet, bedpan, or urinal?: Total Help from another person bathing (including washing, rinsing, drying)?: A Lot Help from another person to put on and taking off regular upper body clothing?: A Lot Help from another person to put on and taking off regular lower body  clothing?: Total 6 Click Score: 9   End of Session Equipment Utilized During Treatment: Gait belt Nurse Communication: Mobility status  Activity Tolerance: Patient tolerated treatment well Patient left: in bed;with call bell/phone within reach;with bed alarm set;with family/visitor present  OT Visit Diagnosis: Cognitive communication deficit (R41.841);Hemiplegia and hemiparesis Symptoms and signs involving cognitive functions: Cerebral infarction Hemiplegia - Right/Left: Left Hemiplegia - dominant/non-dominant: Non-Dominant Hemiplegia - caused by: Cerebral infarction                Time: 4383-7793 OT Time Calculation (min): 43 min Charges:  OT General Charges $OT Visit: 1 Visit OT Evaluation $OT Eval Moderate Complexity: 1 Mod OT Treatments $Neuromuscular Re-education: 23-37 mins  Lucille Passy, OTR/L Acute Rehabilitation Services Pager [phone] Office [phone]   Lucille Passy M 04/24/2018, 2:40 PM

## 2018-04-24 NOTE — Progress Notes (Signed)
Modified Barium Swallow Progress Note  Patient Details  Name: [name] MRN: [id number] Date of Birth: 1956/01/10  Today's Date: 04/24/2018  Modified Barium Swallow completed.  Full report located under Chart Review in the Imaging Section.  Brief recommendations include the following:  Clinical Impression  Pt presents with functional oropharyngeal abilities and no observed aspiration with nectar thick liquids and thin liquids. Pt is very impulsive and his cognitive deficits prevent him from following directions related to rate or bolus size. Therefore, after initial trials established safety with consumption, pt allowed to impulsively control rate and bolus under fluro with no observed aspiration.  Pt appropriate for regular diet with thin liquids (straw allowed), medicine whole wih thin liquids. Nursing assistance for self-feeding to visual and cognitive deficits.    Swallow Evaluation Recommendations       SLP Diet Recommendations: Regular solids;Thin liquid   Liquid Administration via: Cup;Straw   Medication Administration: Whole meds with liquid   Supervision: Staff to assist with self feeding;Full supervision/cueing for compensatory strategies   Compensations: Minimize environmental distractions;Slow rate;Small sips/bites   Postural Changes: Seated upright at 90 degrees   Oral Care Recommendations: Oral care BID        Gabreal Worton 04/24/2018,11:08 AM

## 2018-04-24 NOTE — Progress Notes (Signed)
  Speech Language Pathology Treatment: Dysphagia;Cognitive-Linquistic  Patient Details Name: Luis Johnson MRN: [id number] DOB: 1955-09-11 Today's Date: 04/24/2018 Time: 0833-0900 SLP Time Calculation (min) (ACUTE ONLY): 27 min  Assessment / Plan / Recommendation Clinical Impression  SLP received pt turning across bed attempting to get out of bed. Wife present and attempting to re-diect pt with no success. Pt perseverative on sitting on edge of bed. SLP facilitated sitting edge of bed with pt then becoming perseverative on walking into bathroom to use urinal. After extensive Max A cues and with additional nursing support pt able to be repositioned in bed (supine). Pt with significantly decreased awareness of current physical deficits and unable to comprehend catheter. Pt impulsive, agitated and refused all attempts to try ice chips - until SLP was leaving room. At this point, pt stated he wanted some ice chips. SLP provided skilled observation of pt consuming ice chips. Pt with good oral manipulation of bolus, active mastication and appearance of swift swallow initiation. Pt with no overt s/s of aspiration. Pt required Mod A to control rate of consumption. He continues to be hoarse but appears to demonstrate improved abilities at bedside. Pt is ready for instrumental swallow study and he was immediately able to comprehend information regarding MBS at 1000. Nursing aware of order for MBS at 1000.     HPI HPI: 63 yo admitted with decreased responsiveness from home with large parenchymal intracerebral hemorrhage in the right parietotemporal area. INtubated for airway protection 2/2-2/7. PMhx: HTN, ICH, CVA, amyloid beta related angiitis      SLP Plan  New goals to be determined pending instrumental study;Continue with current plan of care       Recommendations  Diet recommendations: NPO Medication Administration: Via alternative means                Oral Care Recommendations: Oral care  QID Follow up Recommendations: Inpatient Rehab SLP Visit Diagnosis: Dysphagia, oropharyngeal phase (R13.12);Cognitive communication deficit (R41.841) Plan: New goals to be determined pending instrumental study;Continue with current plan of care       GO                Delanie Tirrell 04/24/2018, 9:54 AM

## 2018-04-24 NOTE — Progress Notes (Addendum)
STROKE TEAM PROGRESS NOTE   INTERVAL HISTORY No one at the bedside. Pt was agitated this am and started on Seroquel.   Vitals:   04/23/18 2015 04/23/18 2300 04/24/18 0403 04/24/18 0500  BP: 107/75 103/63 120/78   Pulse: 85 80 92   Resp: 20 20 18    Temp: (!) 97.5 F (36.4 C) 97.7 F (36.5 C) 98.2 F (36.8 C)   TempSrc: Axillary Axillary Oral   SpO2: 98% 97% 98%   Weight:    70.2 kg  Height:        CBC:  Recent Labs  Lab 04/23/18 0401 04/24/18 0434  WBC 10.1 8.2  NEUTROABS 9.0* 6.2  HGB 12.4* 11.8*  HCT 38.7* 36.7*  MCV 82.9 82.8  PLT 167 169    Basic Metabolic Panel:  Recent Labs  Lab 04/19/18 0557  04/23/18 0401 04/24/18 0434  NA 159*   < > 153* 152*  K 3.0*   < > 4.0 3.2*  CL 124*   < > 116* 119*  CO2 25   < > 23 24  GLUCOSE 142*   < > 172* 194*  BUN 13   < > 63* 67*  CREATININE 0.83   < > 2.05* 1.45*  CALCIUM 9.2   < > 9.5 8.9  MG 2.2  --  2.7*  --   PHOS 3.1  --  3.9  --    < > = values in this interval not displayed.   Lipid Panel:     Component Value Date/Time   CHOL 217 (H) 04/22/2018 0647   TRIG 189 (H) 04/24/2018 0434   HDL 33 (L) 04/22/2018 0647   CHOLHDL 6.6 04/22/2018 0647   VLDL 30 04/22/2018 0647   LDLCALC 154 (H) 04/22/2018 0647   HgbA1c:  Lab Results  Component Value Date   HGBA1C 6.1 (H) 04/22/2018   Urine Drug Screen:     Component Value Date/Time   LABOPIA NONE DETECTED 04/21/2018 0245   COCAINSCRNUR NONE DETECTED 04/21/2018 0245   LABBENZ NONE DETECTED 04/21/2018 0245   AMPHETMU NONE DETECTED 04/21/2018 0245   THCU NONE DETECTED 04/21/2018 0245   LABBARB NONE DETECTED 04/21/2018 0245    Alcohol Level     Component Value Date/Time   ETH <10 12/21/2017 1644    IMAGING Ct Head Code Stroke Wo Contrast 04/16/2018 IMPRESSION:  1. Large acute RIGHT hemisphere multilobar intracerebral hemorrhage, consistent with complication of cerebral amyloid angiopathy. Approximate volume 112 mL. Significant RIGHT-to-LEFT shift of 8 mm  with early uncal herniation.  2. ASPECTS is not applicable.   Ct Head Wo Contrast 04/16/2018 IMPRESSION:  1. Stable large posterior right hemisphere intra-axial hemorrhage since 0721 hours today, which might be the sequelae of an amyloid angiopathy. Estimated blood volume 123 mL, with no intraventricular or extra-axial extension.  2. Stable associated mass effect with leftward midline shift of 7 mm and subtotal effacement of the right lateral ventricle.  3. No new intracranial abnormality.   MRI HEAD:  04/18/2018 IMPRESSION: 1. Acute to subacute large RIGHT temporal occipital hematoma with 5 mm RIGHT-to-LEFT midline shift. No ventricular entrapment.  2. Extensive chronic microhemorrhages in advanced chronic small vessel ischemic changes seen with amyloid angiopathy/angitis.  3. Old LEFT occipital hemorrhage.   MRA HEAD:  04/18/2018 IMPRESSION: 1. No emergent large vessel occlusion or flow-limiting stenosis. Mild stenosis RIGHT M1 and LEFT P2 segment.  2. 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   Ct Head Wo Contrast 04/19/2018 IMPRESSION:  1. Interval  decrease in size of right temporal occipital intraparenchymal hemorrhage, estimated volume 96 cc on today's exam, previously 123 cc on 04/16/2018.  2. Slightly improved associated edema and regional mass effect. Right-to-left midline shift now measures 5 mm, previously 7 mm.  3. No other new acute intracranial abnormality.   Ct Head Wo Contrast 04/24/2018 1. Interval partial dispersion hemorrhage within the right occipital temporal lobe with decreased attenuation. Stable volume of distribution, surrounding edema, and 5 mm right-to-left midline shift. 2. No new intracranial abnormality identified.         PHYSICAL EXAM  General - Well nourished, well developed, disorientated to place. Cardiovascular - Regular rate and rhythm, tachycardia.  Neuro - lethargic, awakes to voice, orientated to self, age, people, but not to place, eyes  open, able to follow simple commands. Able to repeat simple sentences, but not able to name. Moderate dysarthria, paucity of speech, mild perseveration. Eyes in right gaze preference, not crossing midline, blinking to visual threat on the right, but not to the left, tracking objects on the right visual field, PERRL. Left facial droop.  Tongue midline in mouth. Right UE and LE at least 4/5. LUE proximal 0/5, bicep 3-/5, finger movement 3/5 and LLE proximal slight withdraw to pain, distally 3/5 with toe movement. DTR 1+ and no babinski. Sensation symmetrical subjectively, coordination not cooperative and gait not tested.   ASSESSMENT/PLAN [name] is a 63 y.o. 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   ICH: Large right temporoparietal hemorrhage with known CAA and HTN  Code Stroke CT head 04/16/18 large R hemisphere multilobar ICH, vol 112 mL with significant right to left shift 8 mm and uncal herniation.   CT head repeat stable large right ICH, vol 123 mL. Stable right to left shift 7 mm.    MRI  Large right temporal occipital hematoma with midline shift no hydrocephalus. Extensive chronic microhemorrhages. Old left occipital hemorrhage.  MRA  No large vessel stenosis or occlusion. 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  Repeat CT head 2/10 w/ interval dispersion of hmg, stable volume, 92mm shift  2D Echo EF >65%.   LDL 103  HgbA1c 5.5  Heparin subcu for VTE prophylaxis  aspirin 81 mg daily prior to admission, now on No antithrombotic given hemorrhage and CAA.  Therapy recommendations: CIR  Disposition: Pending  Await CIR bed  History of stroke and CAA  10/2014 admitted for left sided numbness, blurry vision, headache.  CT showed left occipital ICH, MRI showed left occipital ICH with right occipital old ICH.  MRA no LVO.  Carotid Doppler and 2D echo negative.  LDL 231 and A1c 6.1  05/2017 admitted  for transient bilateral vision loss and headache.  CT and MRI showed right occipital SAH.  Clinical course consistent with CAA  Respiratory distress  Intubated on admission  Extubated 04/21/2018  Patient tolerating well  Improved secretion  Chest x-ray showed stable vascular congestion status post lasix IV 40   Cytotoxic cerebral edema  Induced hypernatremia  CT shows 7 to 8 mm shift and uncal herniation  On 3% protocol to decrease cerebral edema, now off  Sodium currently [phone]  Allow sodium slowly drift down  On tube feeding and free water  CT repeat decreasing edema, shift now 52mm.  Dysphagia   On core track with tube feeding @ 50 and now free water  Passed MBS today with SLP recommending reg diet with thin liquis  Keep coretrak in over night  Change meds to PO  If tolerating diet, d/c tube tomorrow  Speech on board  Febrile and leukocytosis, resolved  Tmax 102.7 ->101.4-> afebrile  WBC 10.4->8.5-> 10.1->8.2  Zosyn started 04/22/2018, stopped 04/23/2018  Tylenol PRN  Hypertensive emergency  Treated with Cleviprex in ICU  BP stable on lower end  Continue labetalol 200 tid, and verapamil 40 q 8  D/C hydralazine due to tachycardia  Hold off ACE inhibitor due to AKI  BP goal < 160   Hyperlipidemia  Home meds: Pravachol 80  LDL 103  Hold statin given ICH  Consider resumption of statin at discharge  Other Stroke Risk Factors  Former cigarette smoker, quit 24 years ago   Family hx stroke (brother, Mother had Alzheimer's and died of ICH)  Other issues  AKI -creatinine 2.81-1.53-1.04-2.08->1.45 - on free water (200 q 6)  Tachycardia - D/C hydralazine  Agitation this am. Given seroquel 25 this am. Continue with plans to continue 12.5 q hs.  Hospital day # 8  Annie MainSharon Biby, MSN, APRN, ANVP-BC, AGPCNP-BC Advanced Practice Stroke Nurse [facility] See Amion for Schedule & Pager information 04/24/2018 2:20 PM    ATTENDING NOTE: I reviewed above note and agree with the assessment and plan. Pt was seen and examined.   Patient neurological stable, however, has agitation this morning, received 25 mg Seroquel, currently drowsy sleepy.  However, he passed a swallow, on diet and ate lunch more than 50%.  Will DC tube feeding and observe adequacy of feeding, may consider DC core track if nutrition adequate.  PT/OT recommend CIR, however family would like SNF given wife has to go to work.  Marvel PlanJindong Baraka Klatt, MD PhD Stroke Neurology 04/24/2018 6:18 PM     To contact Stroke Continuity provider, please refer to [email or web address]. After hours, contact General Neurology

## 2018-04-24 NOTE — [place] FL2 (Signed)
[place] MEDICAID FL2 LEVEL OF CARE SCREENING TOOL     IDENTIFICATION  Patient Name: Harrington Tatom Birthdate: [birth year] Sex: male Admission Date (Current Location): 04/16/2018  [facility] [place] and [place] Number:  Producer, television/film/video and Address:  [facility] [place], [address], [place], [place] [address]      Provider Number: [id number]  Attending Physician Name and Address:  Marvel Plan, MD  Relative Name and Phone Number:       Current Level of Care: Hospital Recommended Level of Care: Skilled Nursing Facility Prior Approval Number:    Date Approved/Denied:   PASRR Number: Manual review  Discharge Plan: SNF    Current Diagnoses: Patient Active Problem List   Diagnosis Date Noted  . Malnutrition of moderate degree 04/18/2018  . Respiratory failure (HCC)   . Generalized headaches 06/29/2017  . History of temporal artery biopsy 06/29/2017  . Vision loss 06/01/2017  . Depression 05/31/2017  . HLD (hyperlipidemia) 05/31/2017  . Prostatitis, acute 02/09/2016  . HTN (hypertension) 02/09/2016  . COPD (chronic obstructive pulmonary disease) (HCC) 02/09/2016  . AKI (acute kidney injury) (HCC) 02/09/2016  . Transaminitis 02/09/2016  . Sepsis (HCC)   . UTI (urinary tract infection) 02/08/2016  . Cerebral amyloid angiopathy (HCC) 11/27/2014  . 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 ICH (intracerebral hemorrhage) (HCC) 11/11/2014    Orientation RESPIRATION BLADDER Height & Weight     Self  Normal Incontinent, External catheter Weight: 154 lb 12.2 oz (70.2 kg) Height:  5\' 7"  (170.2 cm)(per record review)  BEHAVIORAL SYMPTOMS/MOOD NEUROLOGICAL BOWEL NUTRITION STATUS      Incontinent Diet(see DC summary)  AMBULATORY STATUS COMMUNICATION OF NEEDS Skin   Extensive Assist Verbally Normal                       Personal Care Assistance Level of Assistance  Bathing, Dressing, Feeding Bathing Assistance: Maximum assistance Feeding assistance: Limited  assistance Dressing Assistance: Maximum assistance     Functional Limitations Info  Sight, Hearing, Speech Sight Info: Adequate Hearing Info: Adequate Speech Info: Impaired(dysarthria)    SPECIAL CARE FACTORS FREQUENCY  PT (By licensed PT), OT (By licensed OT), Speech therapy     PT Frequency: 5x/wk OT Frequency: 5x/wk     Speech Therapy Frequency: 5x/wk      Contractures Contractures Info: Not present    Additional Factors Info  Code Status, Allergies, Psychotropic, Insulin Sliding Scale Code Status Info: Full Allergies Info: Eggs Or Egg-derived Products, Acetic Acid, Tea, Other Psychotropic Info: Seroquel 12.5mg  daily at bed Insulin Sliding Scale Info: 0-15 units every 4 hours       Current Medications (04/24/2018):  This is the current hospital active medication list Current Facility-Administered Medications  Medication Dose Route Frequency Provider Last Rate Last Dose  .  stroke: mapping our early stages of recovery book   Does not apply Once Milon Dikes, MD      . 0.9 %  sodium chloride infusion   Intravenous PRN Marvel Plan, MD   Stopped at 04/24/18 0225  . acetaminophen (TYLENOL) tablet 650 mg  650 mg Oral Q4H PRN Milon Dikes, MD       Or  . acetaminophen (TYLENOL) solution 650 mg  650 mg Per Tube Q4H PRN Milon Dikes, MD   650 mg at 04/24/18 3735   Or  . acetaminophen (TYLENOL) suppository 650 mg  650 mg Rectal Q4H PRN Milon Dikes, MD      . chlorhexidine [facility])  0.12 % solution 15 mL  15 mL Mouth Rinse BID Lynnell CatalanAgarwala, Ravi, MD   15 mL at 04/24/18 0924  . feeding supplement (VITAL HIGH PROTEIN) liquid 1,000 mL  1,000 mL Per Tube Q24H Agarwala, Ravi, MD   1,000 mL at 04/24/18 0003  . free water 200 mL  200 mL Per Tube Q6H Marvel PlanXu, Jindong, MD   200 mL at 04/24/18 1219  . heparin injection 5,000 Units  5,000 Units Subcutaneous Q8H Lynnell CatalanAgarwala, Ravi, MD   5,000 Units at 04/24/18 0605  . hydrALAZINE (APRESOLINE) injection 25 mg  25 mg Intravenous Q8H PRN Micki RileySethi, Pramod  S, MD   25 mg at 04/21/18 2023  . insulin aspart (novoLOG) injection 0-15 Units  0-15 Units Subcutaneous Q4H Desai, Rahul P, PA-C   3 Units at 04/24/18 0428  . ipratropium-albuterol (DUONEB) 0.5-2.5 (3) MG/3ML nebulizer solution 3 mL  3 mL Nebulization Q6H PRN Agarwala, Daleen Boavi, MD   3 mL at 04/22/18 0818  . labetalol (NORMODYNE) tablet 200 mg  200 mg Oral TID Alyson ReedyYacoub, Wesam G, MD   200 mg at 04/24/18 [id number]  . labetalol (NORMODYNE,TRANDATE) injection 10-20 mg  10-20 mg Intravenous Q2H PRN Marvel PlanXu, Jindong, MD      . MEDLINE mouth rinse  15 mL Mouth Rinse q12n4p Agarwala, Ravi, MD   15 mL at 04/24/18 1220  . multivitamin with minerals tablet 1 tablet  1 tablet Per Tube Daily Desai, Rahul P, PA-C   1 tablet at 04/24/18 0924  . pantoprazole sodium (PROTONIX) 40 mg/20 mL oral suspension 40 mg  40 mg Per Tube Daily Lynnell CatalanAgarwala, Ravi, MD   40 mg at 04/24/18 0924  . QUEtiapine (SEROQUEL) tablet 12.5 mg  12.5 mg Per NG tube QHS Marvel PlanXu, Jindong, MD      . senna-docusate (Senokot-S) tablet 1 tablet  1 tablet Per Tube BID Micki RileySethi, Pramod S, MD   1 tablet at 04/24/18 [phone]  . sodium chloride flush (NS) 0.9 % injection 10-40 mL  10-40 mL Intracatheter Q12H Marvel PlanXu, Jindong, MD   10 mL at 04/24/18 0928  . sodium chloride flush (NS) 0.9 % injection 10-40 mL  10-40 mL Intracatheter PRN Marvel PlanXu, Jindong, MD      . sodium chloride flush (NS) 0.9 % injection 3 mL  3 mL Intravenous Once Curatolo, Adam, DO      . verapamil (CALAN) tablet 40 mg  40 mg Per Tube Q8H Micki RileySethi, Pramod S, MD   40 mg at 04/24/18 0605     Discharge Medications: Please see discharge summary for a list of discharge medications.  Relevant Imaging Results:  Relevant Lab Results:   Additional Information SS#: [id number]  Baldemar LenisElizabeth M Stpehen Petitjean, LCSW

## 2018-04-24 NOTE — Care Management Important Message (Signed)
Important Message  Patient Details  Name: Luis Johnson MRN: [id number] Date of Birth: [birth year]   Medicare Important Message Given:  Yes    [name] 04/24/2018, 4:10 PM

## 2018-04-25 LAB — GLUCOSE, CAPILLARY
Glucose-Capillary: 114 mg/dL — ABNORMAL HIGH (ref 70–99)
Glucose-Capillary: 121 mg/dL — ABNORMAL HIGH (ref 70–99)
Glucose-Capillary: 152 mg/dL — ABNORMAL HIGH (ref 70–99)
Glucose-Capillary: 166 mg/dL — ABNORMAL HIGH (ref 70–99)
Glucose-Capillary: 176 mg/dL — ABNORMAL HIGH (ref 70–99)
Glucose-Capillary: 182 mg/dL — ABNORMAL HIGH (ref 70–99)

## 2018-04-25 LAB — CBC WITH DIFFERENTIAL/PLATELET
Abs Immature Granulocytes: 0.06 10*3/uL (ref 0.00–0.07)
Basophils Absolute: 0 10*3/uL (ref 0.0–0.1)
Basophils Relative: 0 %
Eosinophils Absolute: 0.2 10*3/uL (ref 0.0–0.5)
Eosinophils Relative: 2 %
HCT: 34.8 % — ABNORMAL LOW (ref 39.0–52.0)
HEMOGLOBIN: 11.3 g/dL — AB (ref 13.0–17.0)
Immature Granulocytes: 1 %
LYMPHS ABS: 1.3 10*3/uL (ref 0.7–4.0)
Lymphocytes Relative: 13 %
MCH: 27 pg (ref 26.0–34.0)
MCHC: 32.5 g/dL (ref 30.0–36.0)
MCV: 83.3 fL (ref 80.0–100.0)
Monocytes Absolute: 1.1 10*3/uL — ABNORMAL HIGH (ref 0.1–1.0)
Monocytes Relative: 11 %
Neutro Abs: 7.1 10*3/uL (ref 1.7–7.7)
Neutrophils Relative %: 73 %
Platelets: 148 10*3/uL — ABNORMAL LOW (ref 150–400)
RBC: 4.18 MIL/uL — ABNORMAL LOW (ref 4.22–5.81)
RDW: 15.9 % — ABNORMAL HIGH (ref 11.5–15.5)
WBC: 9.7 10*3/uL (ref 4.0–10.5)
nRBC: 0.3 % — ABNORMAL HIGH (ref 0.0–0.2)

## 2018-04-25 MED ORDER — LABETALOL HCL 100 MG PO TABS
100.0000 mg | ORAL_TABLET | Freq: Three times a day (TID) | ORAL | Status: DC
  Administered 2018-04-25 – 2018-04-27 (×5): 100 mg via ORAL
  Filled 2018-04-25 (×7): qty 1

## 2018-04-25 NOTE — Progress Notes (Addendum)
STROKE TEAM PROGRESS NOTE   INTERVAL HISTORY Wife at bedside. She is working with SW to get him to [employer], where she works. Pt ate good breakfast. Will d/c TF.  Vitals:   04/24/18 1954 04/24/18 2319 04/25/18 0357 04/25/18 0728  BP: 117/78 (!) 127/94 112/79 (!) 146/94  Pulse: 99 97 (!) 103 97  Resp: 20 18 18 18   Temp: 99.1 F (37.3 C) 99.1 F (37.3 C)  99.2 F (37.3 C)  TempSrc: Oral Oral Oral Oral  SpO2: 98% 97% 95% 98%  Weight:      Height:        CBC:  Recent Labs  Lab 04/24/18 0434 04/25/18 0410  WBC 8.2 9.7  NEUTROABS 6.2 7.1  HGB 11.8* 11.3*  HCT 36.7* 34.8*  MCV 82.8 83.3  PLT 169 148*    Basic Metabolic Panel:  Recent Labs  Lab 04/19/18 0557  04/23/18 0401 04/24/18 0434  NA 159*   < > 153* 152*  K 3.0*   < > 4.0 3.2*  CL 124*   < > 116* 119*  CO2 25   < > 23 24  GLUCOSE 142*   < > 172* 194*  BUN 13   < > 63* 67*  CREATININE 0.83   < > 2.05* 1.45*  CALCIUM 9.2   < > 9.5 8.9  MG 2.2  --  2.7*  --   PHOS 3.1  --  3.9  --    < > = values in this interval not displayed.    IMAGING past 24h Dg Swallowing Func-speech Pathology  Result Date: 04/24/2018 Objective Swallowing Evaluation: Type of Study: MBS-Modified Barium Swallow Study  Patient Details Name: Baran Klemz MRN: [id number] Date of Birth: 1956/02/25 Today's Date: 04/24/2018 Time: SLP Start Time (ACUTE ONLY): 1015 -SLP Stop Time (ACUTE ONLY): 1040 SLP Time Calculation (min) (ACUTE ONLY): 25 min Past Medical History: Past Medical History: Diagnosis Date . Hypertension  . Stroke [facility])  Past Surgical History: Past Surgical History: Procedure Laterality Date . ARTERY BIOPSY Bilateral 06/03/2017  Procedure: BILATERAL TEMPORAL ARTERY BIOPSY;  Surgeon: Fransisco Hertz, MD;  Location: [facility] OR;  Service: Vascular;  Laterality: Bilateral; . IR 3D INDEPENDENT WKST  06/02/2017 . IR ANGIO INTRA EXTRACRAN SEL COM CAROTID INNOMINATE BILAT MOD SED  06/02/2017 . IR ANGIO VERTEBRAL SEL VERTEBRAL BILAT MOD SED  06/02/2017 .  SHOULDER SURGERY   HPI: 63 yo admitted with decreased responsiveness from home with large parenchymal intracerebral hemorrhage in the right parietotemporal area. INtubated for airway protection 2/2-2/7. PMhx: HTN, ICH, CVA, amyloid beta related angiitis  Subjective: pt confused but conversant Assessment / Plan / Recommendation CHL IP CLINICAL IMPRESSIONS 04/24/2018 Clinical Impression Pt presents with functional oropharyngeal abilities and no observed aspiration with nectar thick liquids and thin liquids. Pt is very impulsive and his cognitive deficits prevent him from following directions related to rate or bolus size. Therefore, after initial trials established safety with consumption, pt allowed to impulsively control rate and bolus under fluro with no observed aspiration.  Pt appropriate for regular diet with thin liquids (straw allowed), medicine whole wih thin liquids. Nursing assistance for self-feeding to visual and cognitive deficits.  SLP Visit Diagnosis Cognitive communication deficit (R41.841);Dysphagia, oropharyngeal phase (R13.12) Attention and concentration deficit following -- Frontal lobe and executive function deficit following -- Impact on safety and function No limitations   CHL IP TREATMENT RECOMMENDATION 04/24/2018 Treatment Recommendations Therapy as outlined in treatment plan below   Prognosis 04/24/2018 Prognosis for Safe Diet Advancement  Good Barriers to Reach Goals Severity of deficits;Cognitive deficits Barriers/Prognosis Comment -- CHL IP DIET RECOMMENDATION 04/24/2018 SLP Diet Recommendations Regular solids;Thin liquid Liquid Administration via Cup;Straw Medication Administration Whole meds with liquid Compensations Minimize environmental distractions;Slow rate;Small sips/bites Postural Changes Seated upright at 90 degrees   CHL IP OTHER RECOMMENDATIONS 04/24/2018 Recommended Consults -- Oral Care Recommendations Oral care BID Other Recommendations --   CHL IP FOLLOW UP RECOMMENDATIONS  04/24/2018 Follow up Recommendations Inpatient Rehab   CHL IP FREQUENCY AND DURATION 04/24/2018 Speech Therapy Frequency (ACUTE ONLY) min 2x/week Treatment Duration 2 weeks      CHL IP ORAL PHASE 04/24/2018 Oral Phase WFL Oral - Pudding Teaspoon -- Oral - Pudding Cup -- Oral - Honey Teaspoon -- Oral - Honey Cup -- Oral - Nectar Teaspoon -- Oral - Nectar Cup -- Oral - Nectar Straw -- Oral - Thin Teaspoon -- Oral - Thin Cup -- Oral - Thin Straw -- Oral - Puree -- Oral - Mech Soft -- Oral - Regular -- Oral - Multi-Consistency -- Oral - Pill -- Oral Phase - Comment --  CHL IP PHARYNGEAL PHASE 04/24/2018 Pharyngeal Phase WFL Pharyngeal- Pudding Teaspoon -- Pharyngeal -- Pharyngeal- Pudding Cup -- Pharyngeal -- Pharyngeal- Honey Teaspoon -- Pharyngeal -- Pharyngeal- Honey Cup -- Pharyngeal -- Pharyngeal- Nectar Teaspoon -- Pharyngeal -- Pharyngeal- Nectar Cup -- Pharyngeal -- Pharyngeal- Nectar Straw -- Pharyngeal -- Pharyngeal- Thin Teaspoon -- Pharyngeal -- Pharyngeal- Thin Cup -- Pharyngeal -- Pharyngeal- Thin Straw -- Pharyngeal -- Pharyngeal- Puree -- Pharyngeal -- Pharyngeal- Mechanical Soft -- Pharyngeal -- Pharyngeal- Regular -- Pharyngeal -- Pharyngeal- Multi-consistency -- Pharyngeal -- Pharyngeal- Pill -- Pharyngeal -- Pharyngeal Comment --  CHL IP CERVICAL ESOPHAGEAL PHASE 04/24/2018 Cervical Esophageal Phase WFL Pudding Teaspoon -- Pudding Cup -- Honey Teaspoon -- Honey Cup -- Nectar Teaspoon -- Nectar Cup -- Nectar Straw -- Thin Teaspoon -- Thin Cup -- Thin Straw -- Puree -- Mechanical Soft -- Regular -- Multi-consistency -- Pill -- Cervical Esophageal Comment -- Happi Overton 04/24/2018, 11:08 AM               PHYSICAL EXAM General - Well nourished, well developed, disorientated to place. Cardiovascular - Regular rate and rhythm  Neuro - lethargic, awakes to voice, orientated to self, age, people, but not to place, eyes open, able to follow simple commands. Able to repeat simple sentences, but not able to  name. Moderate dysarthria, paucity of speech, mild perseveration. Eyes in right gaze preference, not crossing midline, blinking to visual threat on the right, but not to the left, tracking objects on the right visual field, PERRL. Left facial droop.  Tongue midline in mouth. Right UE and LE at least 4/5. LUE proximal 0/5, bicep 3-/5, finger movement 3/5 and LLE proximal slight withdraw to pain, distally 3/5 with toe movement. DTR 1+ and no babinski. Sensation symmetrical subjectively, coordination not cooperative and gait not tested.   ASSESSMENT/PLAN Mr. Antionette Polesony Wortmann is a 63 y.o. 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   ICH: Large right temporoparietal hemorrhage with known CAA and HTN  Code Stroke CT head 04/16/18 large R hemisphere multilobar ICH, vol 112 mL with significant right to left shift 8 mm and uncal herniation.   CT head repeat stable large right ICH, vol 123 mL. Stable right to left shift 7 mm.    MRI  Large right temporal occipital hematoma with midline shift no hydrocephalus. Extensive chronic microhemorrhages. Old left occipital  hemorrhage.  MRA  No large vessel stenosis or occlusion. 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  Repeat CT head 2/10 w/ interval dispersion of hmg, stable volume, 75mm shift  2D Echo EF >65%.   LDL 103  HgbA1c 5.5  Heparin subcu for VTE prophylaxis  aspirin 81 mg daily prior to admission, now on No antithrombotic given hemorrhage and CAA.  Therapy recommendations: CIR -> wife works at [employer] and would like him placed there  Disposition: Pending. SW working on  Medically ready for d/c when SNF bed available  History of stroke and CAA  10/2014 admitted for left sided numbness, blurry vision, headache.  CT showed left occipital ICH, MRI showed left occipital ICH with right occipital old ICH.  MRA no LVO.  Carotid Doppler and 2D echo negative.  LDL 231 and A1c  6.1  05/2017 admitted for transient bilateral vision loss and headache.  CT and MRI showed right occipital SAH.  Clinical course consistent with CAA  Respiratory distress  Intubated on admission  Extubated 04/21/2018  Patient tolerating well  Improved secretion  Chest x-ray showed stable vascular congestion status post lasix IV 40   Cytotoxic cerebral edema  Induced hypernatremia  CT shows 7 to 8 mm shift and uncal herniation  On 3% protocol to decrease cerebral edema, now off  Sodium currently [phone]  Allow sodium slowly drift down  CT repeat decreasing edema, shift now 25mm.  Dysphagia, resolved  Treated w/ tube feeding   Passed MBS with SLP recommending reg diet with thin liquis  d/c coretrak tube   Febrile and leukocytosis, resolved  Tmax 102.7 ->101.4-> afebrile  WBC 10.4->8.5-> 10.1->9.7  Zosyn started 04/22/2018, stopped 04/23/2018  Tylenol PRN  Hypertensive emergency  Treated with Cleviprex in ICU  BP continues on the lower end  On labetalol 200 tid, and verapamil 40 q 8  Decrease labetalol to 100 tid  D/C hydralazine due to tachycardia  Hold off ACE inhibitor due to AKI  BP goal < 160   Hyperlipidemia  Home meds: Pravachol 80  LDL 103  Hold statin given ICH  Consider resumption of statin at discharge  Other Stroke Risk Factors  Former cigarette smoker, quit 24 years ago   Family hx stroke (brother, Mother had Alzheimer's and died of ICH)  Other issues  AKI -creatinine 2.81-1.53-1.04-2.08->1.45 - on free water (200 q 6)  Tachycardia - D/C hydralazine  Agitation improved. On seroquel 12.5 q hs  Hospital day # 9  Annie Main, MSN, APRN, ANVP-BC, AGPCNP-BC Advanced Practice Stroke Nurse [facility] See Amion for Schedule & Pager information 04/25/2018 8:26 AM   ATTENDING NOTE: I reviewed above note and agree with the assessment and plan. Pt was seen and examined.   Wife at bedside, patient was  sleeping.  As per RN, patient had good sleep last night with Seroquel, wake up this morning worked with PT, awake alert.  Passed swallow and able to eat more than 50% of each meal, tube feeding discontinued.  BP on the lower end, decrease labetalol dose.  Family prefer SNF, case Production designer, theatre/television/film and social worker are working on it.  Marvel Plan, MD PhD Stroke Neurology 04/25/2018 6:16 PM      To contact Stroke Continuity provider, please refer to [email or web address]. After hours, contact General Neurology

## 2018-04-25 NOTE — Progress Notes (Signed)
Physical Therapy Treatment Patient Details Name: Luis Johnson MRN: [id number] DOB: 1955/11/28 Today's Date: 04/25/2018    History of Present Illness 63 yo admitted with decreased responsiveness from home with large parenchymal intracerebral hemorrhage in the right parietotemporal area. INtubated for airway protection 2/2-2/7. PMhx: HTN, ICH, CVA, amyloid beta related angiitis    PT Comments    Patient more awake today but did need to be aroused to open his eyes and assist with movement. Wife in room during session and assisted with lines. PT placed lift transfer pad under patient after partial stand pivot transfer from bed to drop arm recliner requiring max assist. Patient required max assist for bed mobility as well. Not safe to attempt ambulation at this time. PT will continue to follow.    Follow Up Recommendations  CIR;Supervision/Assistance - 24 hour     Equipment Recommendations  Wheelchair (measurements PT);Wheelchair cushion (measurements PT)    Recommendations for Other Services       Precautions / Restrictions Precautions Precautions: Fall Precaution Comments: cortrak, right gaze, left HP, SBP <160    Mobility  Bed Mobility Overal bed mobility: Needs Assistance Bed Mobility: Sidelying to Sit;Rolling Rolling: Max assist Sidelying to sit: Max assist          Transfers Overall transfer level: Needs assistance Equipment used: 1 person hand held assist Transfers: Stand Pivot Transfers;Sit to/from Stand Sit to Stand: Max assist         General transfer comment: partial stand pivot to drop arm recliner with lift pad under patient for lift transfer back to bed by nursing when ready.  Ambulation/Gait             General Gait Details: unable   Stairs             Wheelchair Mobility    Modified Rankin (Stroke Patients Only) Modified Rankin (Stroke Patients Only) Pre-Morbid Rankin Score: No symptoms Modified Rankin: Severe disability      Balance Overall balance assessment: Needs assistance Sitting-balance support: Feet supported Sitting balance-Leahy Scale: Zero Sitting balance - Comments: L lateral and posterior lean in sitting EOB   Standing balance support: Single extremity supported;During functional activity Standing balance-Leahy Scale: Zero                              Cognition Arousal/Alertness: Lethargic Behavior During Therapy: Flat affect Overall Cognitive Status: Impaired/Different from baseline                                        Exercises      General Comments        Pertinent Vitals/Pain Pain Assessment: No/denies pain    Home Living                      Prior Function            PT Goals (current goals can now be found in the care plan section) Acute Rehab PT Goals Patient Stated Goal: return home PT Goal Formulation: With patient Time For Goal Achievement: 05/05/18 Potential to Achieve Goals: Fair Progress towards PT goals: Progressing toward goals    Frequency    Min 4X/week      PT Plan Current plan remains appropriate    Co-evaluation              AM-PAC  PT "6 Clicks" Mobility   Outcome Measure  Help needed turning from your back to your side while in a flat bed without using bedrails?: Total Help needed moving from lying on your back to sitting on the side of a flat bed without using bedrails?: Total Help needed moving to and from a bed to a chair (including a wheelchair)?: Total Help needed standing up from a chair using your arms (e.g., wheelchair or bedside chair)?: Total Help needed to walk in hospital room?: Total Help needed climbing 3-5 steps with a railing? : Total 6 Click Score: 6    End of Session Equipment Utilized During Treatment: Gait belt Activity Tolerance: Patient limited by fatigue Patient left: in chair;with family/visitor present Nurse Communication: Mobility status PT Visit Diagnosis:  Unsteadiness on feet (R26.81);Muscle weakness (generalized) (M62.81);Other abnormalities of gait and mobility (R26.89);Other symptoms and signs involving the nervous system (E17.408)     Time: 1030-1053 PT Time Calculation (min) (ACUTE ONLY): 23 min  Charges:  $Therapeutic Activity: 23-37 mins                     Katina Dung. Hartnett-Rands, MS, PT [place] [facility] [phone] 04/25/2018, 11:06 AM

## 2018-04-25 NOTE — Progress Notes (Signed)
CVC triple lumen removed. Sterile technique was used. Blue tip intact with no drainage. Nurse held pressure for 15 minutes after removal. Initial vitals within normal limits. Patient and wife instructed to lay flat for 45 minutes. Nurse will continue to monitor. Luis Johnson

## 2018-04-25 NOTE — Progress Notes (Signed)
IP rehab admissions - I met with patient and his wife yesterday and discussed CIR.  Wife works at [employer] and she would like to try to get patient to [facility].  Case manager is aware.  Call me for questions.  [phone]

## 2018-04-26 DIAGNOSIS — I161 Hypertensive emergency: Secondary | ICD-10-CM | POA: Diagnosis present

## 2018-04-26 DIAGNOSIS — R451 Restlessness and agitation: Secondary | ICD-10-CM | POA: Diagnosis not present

## 2018-04-26 DIAGNOSIS — Z823 Family history of stroke: Secondary | ICD-10-CM

## 2018-04-26 DIAGNOSIS — I69359 Hemiplegia and hemiparesis following cerebral infarction affecting unspecified side: Secondary | ICD-10-CM

## 2018-04-26 DIAGNOSIS — E870 Hyperosmolality and hypernatremia: Secondary | ICD-10-CM | POA: Diagnosis not present

## 2018-04-26 DIAGNOSIS — G936 Cerebral edema: Secondary | ICD-10-CM | POA: Diagnosis not present

## 2018-04-26 LAB — CBC WITH DIFFERENTIAL/PLATELET
Abs Immature Granulocytes: 0.11 10*3/uL — ABNORMAL HIGH (ref 0.00–0.07)
Basophils Absolute: 0 10*3/uL (ref 0.0–0.1)
Basophils Relative: 0 %
Eosinophils Absolute: 0.2 10*3/uL (ref 0.0–0.5)
Eosinophils Relative: 1 %
HCT: 37.7 % — ABNORMAL LOW (ref 39.0–52.0)
HEMOGLOBIN: 12 g/dL — AB (ref 13.0–17.0)
Immature Granulocytes: 1 %
Lymphocytes Relative: 9 %
Lymphs Abs: 1.2 10*3/uL (ref 0.7–4.0)
MCH: 26.7 pg (ref 26.0–34.0)
MCHC: 31.8 g/dL (ref 30.0–36.0)
MCV: 83.8 fL (ref 80.0–100.0)
MONO ABS: 1.2 10*3/uL — AB (ref 0.1–1.0)
MONOS PCT: 9 %
Neutro Abs: 11.2 10*3/uL — ABNORMAL HIGH (ref 1.7–7.7)
Neutrophils Relative %: 80 %
Platelets: 216 10*3/uL (ref 150–400)
RBC: 4.5 MIL/uL (ref 4.22–5.81)
RDW: 15.9 % — ABNORMAL HIGH (ref 11.5–15.5)
WBC: 13.9 10*3/uL — ABNORMAL HIGH (ref 4.0–10.5)
nRBC: 0 % (ref 0.0–0.2)

## 2018-04-26 LAB — BASIC METABOLIC PANEL
Anion gap: 6 (ref 5–15)
BUN: 20 mg/dL (ref 8–23)
CO2: 25 mmol/L (ref 22–32)
Calcium: 8.9 mg/dL (ref 8.9–10.3)
Chloride: 111 mmol/L (ref 98–111)
Creatinine, Ser: 1.02 mg/dL (ref 0.61–1.24)
GFR calc Af Amer: >60 mL/min (ref >60)
GFR calc non Af Amer: >60 mL/min (ref >60)
Glucose, Bld: 137 mg/dL — ABNORMAL HIGH (ref 70–99)
Potassium: 4.2 mmol/L (ref 3.5–5.1)
Sodium: 142 mmol/L (ref 135–145)

## 2018-04-26 LAB — GLUCOSE, CAPILLARY
GLUCOSE-CAPILLARY: 105 mg/dL — AB (ref 70–99)
Glucose-Capillary: 139 mg/dL — ABNORMAL HIGH (ref 70–99)
Glucose-Capillary: 122 mg/dL — ABNORMAL HIGH (ref 70–99)
Glucose-Capillary: 115 mg/dL — ABNORMAL HIGH (ref 70–99)
Glucose-Capillary: 109 mg/dL — ABNORMAL HIGH (ref 70–99)

## 2018-04-26 NOTE — Discharge Summary (Signed)
Stroke Discharge Summary  Patient ID: Luis Johnson   MRN: [id number]      DOB: 09/01/1955  Date of Admission: 04/16/2018 Date of Discharge: 04/27/2018  Attending Physician:  Lenise Herald, MD, Stroke MD Consultant(s):     Faith Rogue, MD (Physical Medicine & Rehabtilitation)  Patient's PCP:  [facility]  DISCHARGE DIAGNOSIS:  Principal Problem:   ICH (intracerebral hemorrhage) (HCC) Active Problems:   Cerebral amyloid angiopathy (HCC)   AKI (acute kidney injury) (HCC)   HLD (hyperlipidemia)   Respiratory failure (HCC)   Malnutrition of moderate degree   History of hemorrhagic stroke  (HCC)   Cytotoxic cerebral edema (HCC)   Hypernatremia, induced   Hypertensive emergency   Family history of stroke   Agitation   Past Medical History:  Diagnosis Date  . Hypertension   . Stroke [facility])    Past Surgical History:  Procedure Laterality Date  . ARTERY BIOPSY Bilateral 06/03/2017   Procedure: BILATERAL TEMPORAL ARTERY BIOPSY;  Surgeon: Fransisco Hertz, MD;  Location: [facility] OR;  Service: Vascular;  Laterality: Bilateral;  . IR 3D INDEPENDENT WKST  06/02/2017  . IR ANGIO INTRA EXTRACRAN SEL COM CAROTID INNOMINATE BILAT MOD SED  06/02/2017  . IR ANGIO VERTEBRAL SEL VERTEBRAL BILAT MOD SED  06/02/2017  . SHOULDER SURGERY      Allergies as of 04/27/2018      Reactions   Eggs Or Egg-derived Products Anaphylaxis, Swelling   Throat and body swells (can only tolerated boiled eggs)   Acetic Acid Other (See Comments)   Pt had initial headache and believes it led to brain bleeds and blindness   Tea Other (See Comments)   Went blind for a week from drinking a strong-bodied tea"   Other Other (See Comments)   Headaches lead to blindness oftentimes (starts with eyes fluttering, then progresses to stuttering)      Medication List    STOP taking these medications   acetaminophen 325 MG tablet Commonly known as:  TYLENOL   acetaminophen-codeine 300-30 MG tablet Commonly  known as:  TYLENOL #3   albuterol-ipratropium 18-103 MCG/ACT inhaler Commonly known as:  COMBIVENT Replaced by:  ipratropium-albuterol 0.5-2.5 (3) MG/3ML Soln   aspirin EC 81 MG tablet   Cholecalciferol 50 MCG (2000 UT) Tabs   divalproex 500 MG 24 hr tablet Commonly known as:  DEPAKOTE ER   EPINEPHrine 0.3 mg/0.3 mL Soaj injection Commonly known as:  EPI-PEN   Fish Oil 1000 MG Cpdr   fluticasone 50 MCG/ACT nasal spray Commonly known as:  FLONASE   lisinopril-hydrochlorothiazide 20-12.5 MG tablet Commonly known as:  PRINZIDE,ZESTORETIC   mometasone 220 MCG/INH inhaler Commonly known as:  ASMANEX   pantoprazole 40 MG tablet Commonly known as:  PROTONIX Replaced by:  pantoprazole sodium 40 mg/20 mL Pack   REFRESH TEARS 0.5 % Soln Generic drug:  carboxymethylcellulose   verapamil 120 MG CR tablet Commonly known as:  CALAN-SR Replaced by:  verapamil 40 MG tablet     TAKE these medications   escitalopram 10 MG tablet Commonly known as:  LEXAPRO Take 10 mg by mouth daily.   feeding supplement (ENSURE ENLIVE) Liqd Take 237 mLs by mouth 2 (two) times daily between meals.   ipratropium-albuterol 0.5-2.5 (3) MG/3ML Soln Commonly known as:  DUONEB Take 3 mLs by nebulization every 6 (six) hours as needed. Replaces:  albuterol-ipratropium 18-103 MCG/ACT inhaler   labetalol 100 MG tablet Commonly known as:  NORMODYNE Take 1 tablet (100 mg total)  by mouth 3 (three) times daily.   multivitamin with minerals Tabs tablet Take 1 tablet by mouth daily. Start taking on:  April 28, 2018   pantoprazole sodium 40 mg/20 mL Pack Commonly known as:  PROTONIX Take 20 mLs (40 mg total) by mouth daily. Start taking on:  April 28, 2018 Replaces:  pantoprazole 40 MG tablet   pravastatin 80 MG tablet Commonly known as:  PRAVACHOL Take 80 mg by mouth daily.   QUEtiapine 25 MG tablet Commonly known as:  SEROQUEL Take 0.5 tablets (12.5 mg total) by mouth at bedtime.    senna-docusate 8.6-50 MG tablet Commonly known as:  Senokot-S Take 1 tablet by mouth 2 (two) times daily.   verapamil 40 MG tablet Commonly known as:  CALAN Take 1 tablet (40 mg total) by mouth every 8 (eight) hours. Replaces:  verapamil 120 MG CR tablet       LABORATORY STUDIES CBC    Component Value Date/Time   WBC 12.9 (H) 04/27/2018 0559   RBC 4.40 04/27/2018 0559   HGB 11.8 (L) 04/27/2018 0559   HCT 36.4 (L) 04/27/2018 0559   PLT 295 04/27/2018 0559   MCV 82.7 04/27/2018 0559   MCH 26.8 04/27/2018 0559   MCHC 32.4 04/27/2018 0559   RDW 15.6 (H) 04/27/2018 0559   LYMPHSABS 1.2 04/27/2018 0559   MONOABS 1.2 (H) 04/27/2018 0559   EOSABS 0.1 04/27/2018 0559   BASOSABS 0.0 04/27/2018 0559   CMP    Component Value Date/Time   NA 142 04/27/2018 0559   K 4.5 04/27/2018 0559   CL 108 04/27/2018 0559   CO2 22 04/27/2018 0559   GLUCOSE 121 (H) 04/27/2018 0559   BUN 19 04/27/2018 0559   CREATININE 0.95 04/27/2018 0559   CALCIUM 9.4 04/27/2018 0559   PROT 8.6 (H) 04/16/2018 0739   ALBUMIN 4.1 04/16/2018 0739   AST 27 04/16/2018 0739   ALT 14 04/16/2018 0739   ALKPHOS 44 04/16/2018 0739   BILITOT 1.4 (H) 04/16/2018 0739   GFRNONAA >60 04/27/2018 0559   GFRAA >60 04/27/2018 0559   COAGS Lab Results  Component Value Date   INR 0.99 04/16/2018   INR 0.97 12/21/2017   INR 0.94 05/31/2017   Lipid Panel    Component Value Date/Time   CHOL 217 (H) 04/22/2018 0647   TRIG 154 (H) 04/27/2018 0559   HDL 33 (L) 04/22/2018 0647   CHOLHDL 6.6 04/22/2018 0647   VLDL 30 04/22/2018 0647   LDLCALC 154 (H) 04/22/2018 0647   HgbA1C  Lab Results  Component Value Date   HGBA1C 6.1 (H) 04/22/2018   Urinalysis    Component Value Date/Time   COLORURINE YELLOW 04/21/2018 0246   APPEARANCEUR CLEAR 04/21/2018 0246   LABSPEC 1.015 04/21/2018 0246   PHURINE 8.0 04/21/2018 0246   GLUCOSEU >=500 (A) 04/21/2018 0246   HGBUR NEGATIVE 04/21/2018 0246   BILIRUBINUR NEGATIVE  04/21/2018 0246   KETONESUR NEGATIVE 04/21/2018 0246   PROTEINUR NEGATIVE 04/21/2018 0246   UROBILINOGEN 0.2 11/12/2014 1015   NITRITE NEGATIVE 04/21/2018 0246   LEUKOCYTESUR NEGATIVE 04/21/2018 0246   Urine Drug Screen     Component Value Date/Time   LABOPIA NONE DETECTED 04/21/2018 0245   COCAINSCRNUR NONE DETECTED 04/21/2018 0245   LABBENZ NONE DETECTED 04/21/2018 0245   AMPHETMU NONE DETECTED 04/21/2018 0245   THCU NONE DETECTED 04/21/2018 0245   LABBARB NONE DETECTED 04/21/2018 0245    Alcohol Level    Component Value Date/Time   ETH <10 12/21/2017 1644  SIGNIFICANT DIAGNOSTIC STUDIES Ct Head Code Stroke Wo Contrast 04/16/2018 0719 1. Large acute RIGHT hemisphere multilobar intracerebral hemorrhage, consistent with complication of cerebral amyloid angiopathy. Approximate volume 112 mL. Significant RIGHT-to-LEFT shift of 8 mm with early uncal herniation.  2. ASPECTS is not applicable.   Ct Head Wo Contrast 04/16/2018 1311 1. Stable large posterior right hemisphere intra-axial hemorrhage since 0721 hours today, which might be the sequelae of an amyloid angiopathy. Estimated blood volume 123 mL, with no intraventricular or extra-axial extension.  2. Stable associated mass effect with leftward midline shift of 7 mm and subtotal effacement of the right lateral ventricle.  3. No new intracranial abnormality.   MRI HEAD:  04/18/2018 0125 1. Acute to subacute large RIGHT temporal occipital hematoma with 5 mm RIGHT-to-LEFT midline shift. No ventricular entrapment.  2. Extensive chronic microhemorrhages in advanced chronic small vessel ischemic changes seen with amyloid angiopathy/angitis.  3. Old LEFT occipital hemorrhage.   MRA HEAD:  04/18/2018 0125 1. No emergent large vessel occlusion or flow-limiting stenosis. Mild stenosis RIGHT M1 and LEFT P2 segment.  2. 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   Ct Head Wo Contrast 04/19/2018 0300 1. Interval decrease in  size of right temporal occipital intraparenchymal hemorrhage, estimated volume 96 cc on today's exam, previously 123 cc on 04/16/2018.  2. Slightly improved associated edema and regional mass effect. Right-to-left midline shift now measures 5 mm, previously 7 mm.  3. No other new acute intracranial abnormality.   Ct Head Wo Contrast 04/24/2018 0544 1. Interval partial dispersion hemorrhage within the right occipital temporal lobe with decreased attenuation. Stable volume of distribution, surrounding edema, and 5 mm right-to-left midline shift. 2. No new intracranial abnormality identified.    2D Echocardiogram   1. The left ventricle has hyperdynamic systolic function of >65%. The cavity size is normal. There is no left ventricular wall thickness. Echo evidence of impaired relaxation diastolic filling patterns.  2. Normal left atrial size.  3. Normal right atrial size.  4. Normal tricuspid valve.  5. The aortic valve tricuspid. There is mild thickening and mild calcification of the aortic valve.  6. No atrial level shunt detected by color flow Doppler.  7. No intracardiac thrombi or masses were visualized.     HISTORY OF PRESENT ILLNESS Luis Johnson is a 63 y.o. male past medical history of prior ICH, amyloid beta related angiitis and inflammation, with past bleed in left hemisphere, hypertension, convexity subarachnoid and nonaneurysmal bleed in the past, presenting today to the ER after EMS was called for decreased responsiveness.  According to his wife, he was complaining of a headache since 4:30 PM on 04/15/2018  ([facility]) and around 5 PM went and laid on the floor and would not get up from the floor.  The wife got concerned this morning and called EMS.  He was noted to be flaccid on the left side on EMS evaluation.  He also had an LVO screen that was 5 on the fast ED scale.  He was brought in as a acute code stroke.  On initial examination, he was able to follow some commands but did not open eyes  to voice or noxious stimulation. GCS of 11.  Noncontrast CT of the head reveals a large parenchymal intracerebral hemorrhage in the right parietotemporal area. Premorbid modified Rankin scale (mRS): 2. He was admitted to the neuro ICU for further evaluation and treatment.    HOSPITAL COURSE [name] is a 63 y.o. male with history of  recurrent ICH with amyloid angiopathy and HTN presenting with headache and left-sided weakness.   ICH: Large right temporoparietal hemorrhage with known CAA and HTN  Code Stroke CT head 04/16/18 large R hemisphere multilobar ICH, vol 112 mL with significant right to left shift 8 mm and uncal herniation.   CT head repeat stable large right ICH, vol 123 mL. Stable right to left shift 7 mm.    MRI  Large right temporal occipital hematoma with midline shift no hydrocephalus. Extensive chronic microhemorrhages. Old left occipital hemorrhage.  MRA  No large vessel stenosis or occlusion. 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  Repeat CT head 2/10 w/ interval dispersion of hmg, stable volume, 5mm shift  2D Echo EF >65%.   LDL 103  HgbA1c 5.5  aspirin 81 mg daily prior to admission, now on No antithrombotic given hemorrhage and CAA. Not a candidate for aspirin or aspirin containing products lifelong.  Therapy recommendations: CIR -> wife works at [facility] and would like him placed there  Disposition: SNF   History of stroke and CAA  10/2014 admitted for left sided numbness, blurry vision, headache.  CT showed left occipital ICH, MRI showed left occipital ICH with right occipital old ICH.  MRA no LVO.  Carotid Doppler and 2D echo negative.  LDL 231 and A1c 6.1  05/2017 admitted for transient bilateral vision loss and headache.  CT and MRI showed right occipital SAH.  Clinical course consistent with CAA  Respiratory distress, resolved  Intubated on admission  Extubated 04/21/2018  Patient tolerating well  Improved secretion  Chest x-ray  showed stable vascular congestion status post lasix IV 40   Cytotoxic cerebral edema, improving  Induced hypernatremia, resolved  CT shows 7 to 8 mm shift and uncal herniation  On 3% protocol to decrease cerebral edema, now off  Sodium currently 142  Allow sodium slowly normalize  CT repeat decreasing edema, shift now 5mm.  Dysphagia, resolved  Treated w/ tube feeding   Passed MBS with SLP recommending reg diet with thin liquis  d/c coretrak tube   Febrile and leukocytosis, resolved  Tmax 102.7 ->101.4-> afebrile  WBC 10.4->8.5-> 10.1->9.7  Zosyn started 04/22/2018, stopped 04/23/2018  Tylenol PRN  Hypertensive emergency  Treated with Cleviprex in ICU  BP better 110-120s  On labetalol 100 tid, and verapamil 40 q 8  No hydralazine due to tachycardia  Hold off ACE inhibitor due to AKI  BP goal normotensive  Hyperlipidemia  Home meds: Pravachol 80  LDL 103  Hold statin given ICH  Resume statin at discharge  Other Stroke Risk Factors  Former cigarette smoker, quit 24 years ago   Family hx stroke (brother, Mother had Alzheimer's and died of ICH)  Other issues  AKI, resolved - creatinine 1.45 - on free water (200 q 6) ->1.02  Tachycardia - D/C hydralazine  Agitation improved. On seroquel 12.5 q hs  Moderate malnutrition d/t chronic illness. Started on Ensure Enlive   DISCHARGE EXAM Blood pressure (!) 141/89, pulse 97, temperature 98.4 F (36.9 C), temperature source Oral, resp. rate (!) 24, height 5\' 7"  (1.702 m), weight 70.2 kg, SpO2 97 %. General - Well nourished, well developed, disorientated to place. Cardiovascular - Regular rate and rhythm Neuro - lethargic, awakes to voice, orientated to self, age, people, but not to place, eyes open, able to follow simple commands. Able to repeat simple sentences, but not able to name. Moderate dysarthria, paucity of speech, mild perseveration. Eyes in right gaze preference, not crossing  midline,  blinking to visual threat on the right, but not to the left, tracking objects on the right visual field, PERRL. Left facial droop.  Tongue midline in mouth. Right UE and LE at least 4/5. LUE proximal 0/5, bicep 3-/5, finger movement 3/5 and LLE proximal slight withdraw to pain, distally 3/5 with toe movement. DTR 1+ and no babinski. Sensation symmetrical subjectively, coordination not cooperative and gait not tested.   Discharge Diet   Regular thin liquids  DISCHARGE PLAN  Disposition:  Discharge to skilled nursing facility for ongoing PT, OT and ST.   Due to hemorrhage and risk of bleeding, do not take aspirin, aspirin-containing medications, or ibuprofen products LIFE LONG  Follow-up Clinic, [facility] in 2 weeks or with attending MD at SNF  Follow-up in [facility] in 4 weeks, office to schedule an appointment.   40 minutes were spent preparing discharge.  Annie Main, MSN, APRN, ANVP-BC, AGPCNP-BC Advanced Practice Stroke Nurse [facility] See Amion for Schedule & Pager information 04/27/2018 10:31 AM

## 2018-04-26 NOTE — Progress Notes (Signed)
Occupational Therapy Treatment Patient Details Name: Luis Johnson MRN: [id number] DOB: June 20, 1955 Today's Date: 04/26/2018    History of present illness 63 yo admitted with decreased responsiveness from home with large parenchymal intracerebral hemorrhage in the right parietotemporal area. INtubated for airway protection 2/2-2/7. PMhx: HTN, ICH, CVA, amyloid beta related angiitis   OT comments  Pt progressing towards established OT goals. Pt continues to present with left visual deficits, poor cognition, and decreased functional use of LUE. Pt requiring Max tactile, verbal, and visual cues and Mod A for oral care while seated in recliner. Pt requiring Max cues to locate grooming items, initiate each step within task, and attend to task. Pt high distractible both internally and by external stimuli. Pt also with overshooting of reach indicating possible diplopia. Continue to recommend CIR for intensive rehab and will continue to follow acutely as admitted.    Follow Up Recommendations  CIR;Supervision/Assistance - 24 hour    Equipment Recommendations  None recommended by OT    Recommendations for Other Services Rehab consult    Precautions / Restrictions Precautions Precautions: Fall Precaution Comments: right gaze, left Hemi, SBP <160 Restrictions Weight Bearing Restrictions: No       Mobility Bed Mobility Overal bed mobility: Needs Assistance Bed Mobility: Sidelying to Sit;Rolling Rolling: Max assist Sidelying to sit: Max assist       General bed mobility comments: In recliner upon arrival  Transfers Overall transfer level: Needs assistance Equipment used: 1 person hand held assist Transfers: Stand Pivot Transfers;Sit to/from Stand Sit to Stand: Max assist Stand pivot transfers: Max assist       General transfer comment: Pt having recently transfers to recliner with PT    Balance Overall balance assessment: Needs assistance Sitting-balance support: Feet  supported Sitting balance-Leahy Scale: Zero Sitting balance - Comments: L lateral and posterior lean in sitting EOB   Standing balance support: Single extremity supported;During functional activity Standing balance-Leahy Scale: Zero                             ADL either performed or assessed with clinical judgement   ADL Overall ADL's : Needs assistance/impaired     Grooming: Oral care;Moderate assistance;Sitting;Cueing for sequencing;Cueing for safety Grooming Details (indicate cue type and reason): Pt unable to locate toothpaste in left visual field. Moved to midline and pt continued to require Max cues to locate toothpaste. Pt requiring Max cues to initate each step of oral care including "pick up tooth paste", "put tooth paste on tooth brush," put tooth brush in your mouth." Max hand over hand to faciltiate grasp in left hand and engage in bilateral coorindation. Pt presenting with overshooting when reaching for grooming objects and requirined back tactile, visual, and verbal cues to locate items.                                General ADL Comments: Focused session on visual deficits, attention to left sides (body and environment), and cognition while performing oral care.      Vision   Additional Comments: Pt with right gaze preference and difficulty locatingf items at midline. Pt requiring Max cues (tactile, visual, and verbal) to attent to left visual field. Pt with tendency to close his eyes during session. Pt overshooting throughout session and unable to locate grooming items without Max cues. Feel pt presents with diplopia, but pt with difficult explaining what  he sees.   Perception     Praxis      Cognition Arousal/Alertness: Lethargic Behavior During Therapy: Flat affect Overall Cognitive Status: Impaired/Different from baseline Area of Impairment: Orientation;Attention;Memory;Following commands;Safety/judgement;Problem solving;Awareness                  Orientation Level: Disoriented to;Time Current Attention Level: Focused Memory: Decreased short-term memory Following Commands: Follows one step commands inconsistently;Follows one step commands with increased time Safety/Judgement: Decreased awareness of deficits;Decreased awareness of safety Awareness: Intellectual Problem Solving: Slow processing;Decreased initiation;Difficulty sequencing;Requires verbal cues;Requires tactile cues General Comments: Upon arrival, pt was sleepy and difficult to engage with his eye closed despite answering OT's questions. Applying cold wash cloth and elevated into upright position facilitated increased arousal and engagement. Pt highly distracted both internally and by external sound or movement. Pt requiring Max cues throughout grooming task to initate, sequence, and terminate each step of oral care.        Exercises General Exercises - Lower Extremity Hip ABduction/ADduction: PROM;Left;Supine;10 reps Toe Raises: PROM;Left;Supine;10 reps Heel Raises: PROM;Left;Supine;10 reps   Shoulder Instructions       General Comments      Pertinent Vitals/ Pain       Pain Assessment: Faces Faces Pain Scale: No hurt Pain Intervention(s): Monitored during session  Home Living                                          Prior Functioning/Environment              Frequency  Min 2X/week        Progress Toward Goals  OT Goals(current goals can now be found in the care plan section)  Progress towards OT goals: Progressing toward goals  Acute Rehab OT Goals Patient Stated Goal: return home OT Goal Formulation: With patient/family Time For Goal Achievement: 05/08/18 Potential to Achieve Goals: Good ADL Goals Pt Will Perform Grooming: with min assist;sitting Pt Will Perform Upper Body Bathing: with mod assist;sitting Pt Will Perform Lower Body Bathing: with mod assist;sit to/from stand Pt Will Transfer to Toilet:  with mod assist;stand pivot transfer;bedside commode Pt Will Perform Toileting - Clothing Manipulation and hygiene: with max assist;sit to/from stand Additional ADL Goal #1: Pt will locate ADL items on Lt with min cues during ADL activities Additional ADL Goal #2: Pt will sustain attention to familiar ADL task x 3 mins with min cues  Plan      Co-evaluation                 AM-PAC OT "6 Clicks" Daily Activity     Outcome Measure   Help from another person eating meals?: Total Help from another person taking care of personal grooming?: A Lot Help from another person toileting, which includes using toliet, bedpan, or urinal?: Total Help from another person bathing (including washing, rinsing, drying)?: A Lot Help from another person to put on and taking off regular upper body clothing?: A Lot Help from another person to put on and taking off regular lower body clothing?: Total 6 Click Score: 9    End of Session    OT Visit Diagnosis: Cognitive communication deficit (R41.841);Hemiplegia and hemiparesis Symptoms and signs involving cognitive functions: Cerebral infarction Hemiplegia - Right/Left: Left Hemiplegia - dominant/non-dominant: Non-Dominant Hemiplegia - caused by: Cerebral infarction   Activity Tolerance Patient tolerated treatment well   Patient Left in bed;with  call bell/phone within reach;with bed alarm set;with family/visitor present   Nurse Communication Mobility status        Time: [id number] OT Time Calculation (min): 33 min  Charges: OT General Charges $OT Visit: 1 Visit OT Treatments $Self Care/Home Management : 23-37 mins  Anwen Cannedy MSOT, OTR/L Acute Rehab Pager: [phone] Office: [phone]   Theodoro GristCharis M Donnel Venuto 04/26/2018, 2:58 PM

## 2018-04-26 NOTE — Progress Notes (Signed)
Nutrition Follow-up  DOCUMENTATION CODES:   Non-severe (moderate) malnutrition in context of chronic illness  INTERVENTION:  Continue Ensure Enlive po BID, each supplement provides 350 kcal and 20 grams of protein.  Encourage adequate PO intake.   NUTRITION DIAGNOSIS:   Moderate Malnutrition related to chronic illness(HTN) as evidenced by mild fat depletion, moderate fat depletion, mild muscle depletion, moderate muscle depletion; ongoing  GOAL:   Patient will meet greater than or equal to 90% of their needsl progressing  MONITOR:   PO intake, Supplement acceptance, Labs, Weight trends, I & O's, Skin  REASON FOR ASSESSMENT:   Consult, Ventilator Enteral/tube feeding initiation and management  ASSESSMENT:   Pt with PMH of HTN, amyloid angiopathy, and prior ICH admitted 2/2 for large R IPH.  2/7 Extubated. Cortrak NGT removed 2/11. Tube feeding discontinued.   Pt has been tolerating diet. Wife has been encouraging pt po intake at meals. Pt currently has Ensure ordered and has been consuming them. RD to continue with current orders to aid in caloric and protein needs. Pt encouraged to eat his food at meals and to drink his supplements.    Labs and medications reviewed.   Diet Order:   Diet Order            Diet regular Room service appropriate? Yes; Fluid consistency: Thin  Diet effective now              EDUCATION NEEDS:   No education needs have been identified at this time  Skin:  Skin Assessment: Reviewed RN Assessment  Last BM:  2/11  Height:   Ht Readings from Last 1 Encounters:  04/17/18 5\' 7"  (1.702 m)    Weight:   Wt Readings from Last 1 Encounters:  04/24/18 70.2 kg    Ideal Body Weight:  67.2 kg  BMI:  Body mass index is 24.24 kg/m.  Estimated Nutritional Needs:   Kcal:  1850-2050  Protein:  85-105 grams  Fluid:  1.8 - 2 L/day    Roslyn Smiling, MS, RD, LDN Pager # [phone] After hours/ weekend pager # [phone]

## 2018-04-26 NOTE — Progress Notes (Signed)
STROKE TEAM PROGRESS NOTE   INTERVAL HISTORY No acute distress, wife at bedside helping to feed him  Vitals:   04/26/18 0016 04/26/18 0342 04/26/18 0804 04/26/18 1124  BP: 127/72 135/78 138/86 (!) 145/87  Pulse: 85 89 79 82  Resp: 18 17 17 18   Temp: 99.1 F (37.3 C) 99.2 F (37.3 C) 99 F (37.2 C) 98.4 F (36.9 C)  TempSrc: Oral Oral    SpO2: 98% 96% 97% 95%  Weight:      Height:        CBC:  Recent Labs  Lab 04/25/18 0410 04/26/18 0517  WBC 9.7 13.9*  NEUTROABS 7.1 11.2*  HGB 11.3* 12.0*  HCT 34.8* 37.7*  MCV 83.3 83.8  PLT 148* 216    Basic Metabolic Panel:  Recent Labs  Lab 04/23/18 0401 04/24/18 0434 04/26/18 0517  NA 153* 152* 142  K 4.0 3.2* 4.2  CL 116* 119* 111  CO2 23 24 25   GLUCOSE 172* 194* 137*  BUN 63* 67* 20  CREATININE 2.05* 1.45* 1.02  CALCIUM 9.5 8.9 8.9  MG 2.7*  --   --   PHOS 3.9  --   --     IMAGING past 24h No results found.  PHYSICAL EXAM- unchanged  General - Well nourished, well developed, disorientated to place. Cardiovascular - Regular rate and rhythm  Neuro - lethargic, awakes to voice, orientated to self, age, people, but not to place, eyes open, able to follow simple commands. Able to repeat simple sentences, but not able to name. Moderate dysarthria, paucity of speech, mild perseveration. Eyes in right gaze preference, not crossing midline, blinking to visual threat on the right, but not to the left, tracking objects on the right visual field, PERRL. Left facial droop.  Tongue midline in mouth. Right UE and LE at least 4/5. LUE proximal 0/5, bicep 3-/5, finger movement 3/5 and LLE proximal slight withdraw to pain, distally 3/5 with toe movement. DTR 1+ and no babinski. Sensation symmetrical subjectively, coordination not cooperative and gait not tested.   ASSESSMENT/PLAN Luis Johnson is a 63 y.o. 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   ICH:  Large right temporoparietal hemorrhage with known CAA and HTN  Code Stroke CT head 04/16/18 large R hemisphere multilobar ICH, vol 112 mL with significant right to left shift 8 mm and uncal herniation.   CT head repeat stable large right ICH, vol 123 mL. Stable right to left shift 7 mm.    MRI  Large right temporal occipital hematoma with midline shift no hydrocephalus. Extensive chronic microhemorrhages. Old left occipital hemorrhage.  MRA  No large vessel stenosis or occlusion. 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  Repeat CT head 2/10 w/ interval dispersion of hmg, stable volume, 28mm shift  2D Echo EF >65%.   LDL 103  HgbA1c 5.5  Heparin subcu for VTE prophylaxis  aspirin 81 mg daily prior to admission, now on No antithrombotic given hemorrhage and CAA.  Therapy recommendations: CIR -> wife works at [employer] [facility] [employer] and would like him placed there  Disposition: Pending. SW working on  Medically ready for d/c when SNF bed available  History of stroke and CAA  10/2014 admitted for left sided numbness, blurry vision, headache.  CT showed left occipital ICH, MRI showed left occipital ICH with right occipital old ICH.  MRA no LVO.  Carotid Doppler and 2D echo negative.  LDL 231 and A1c 6.1  05/2017 admitted for transient bilateral vision loss and headache.  CT and MRI showed right occipital SAH.  Clinical course consistent with CAA  Respiratory distress  Intubated on admission  Extubated 04/21/2018  Patient tolerating well  Improved secretion  Chest x-ray showed stable vascular congestion status post lasix IV 40   Cytotoxic cerebral edema  Induced hypernatremia  CT shows 7 to 8 mm shift and uncal herniation  On 3% protocol to decrease cerebral edema, now off  Sodium currently 520 707 1152  Allow sodium slowly drift down  CT repeat decreasing edema, shift now 81mm.  Dysphagia, resolved  Treated w/ tube feeding   Passed MBS with SLP recommending reg diet with  thin liquis  d/c coretrak tube   Febrile and leukocytosis, resolved  Tmax 102.7 ->101.4-> afebrile  WBC 10.4->8.5-> 10.1->9.7  Zosyn started 04/22/2018, stopped 04/23/2018  Tylenol PRN  Hypertensive emergency  Treated with Cleviprex in ICU  BP better 110-120s  On labetalol 100 tid, and verapamil 40 q 8  No hydralazine due to tachycardia  Hold off ACE inhibitor due to AKI  BP goal < 160   Hyperlipidemia  Home meds: Pravachol 80  LDL 103  Hold statin given ICH  Consider resumption of statin at discharge  Other Stroke Risk Factors  Former cigarette smoker, quit 24 years ago   Family hx stroke (brother, Mother had Alzheimer's and died of ICH)  Other issues  AKI -creatinine 2.81-1.53-1.04-2.08->1.45 - on free water (200 q 6)  Tachycardia - D/C hydralazine  Agitation improved. On seroquel 12.5 q hs  Hospital day # 10  Got approval for SNF today, agree with plan above   To contact Stroke Continuity provider, please refer to [email or web address]. After hours, contact General Neurology

## 2018-04-26 NOTE — Progress Notes (Signed)
Physical Therapy Treatment Patient Details Name: Luis Johnson MRN: [id number] DOB: 08-28-1955 Today's Date: 04/26/2018    History of Present Illness 63 yo admitted with decreased responsiveness from home with large parenchymal intracerebral hemorrhage in the right parietotemporal area. INtubated for airway protection 2/2-2/7. PMhx: HTN, ICH, CVA, amyloid beta related angiitis    PT Comments    PROM completed to Lt LE. Patient continues to have a very difficult time with all mobility. Max assist for bed mobility and transfers. Ambulation unsafe at this time as patient requires max assist just to perform sit to stand and stand pivot transfer to chair. Patient transferred to chair from bed with lift pad underneath him so nursing can perform [facility] lift transfer back to bed. Wife present throughout session. Current recommendations remain appropriate. PT will continue to follow.     Follow Up Recommendations  CIR;Supervision/Assistance - 24 hour     Equipment Recommendations  Wheelchair (measurements PT);Wheelchair cushion (measurements PT)    Recommendations for Other Services       Precautions / Restrictions Precautions Precautions: Fall Precaution Comments: cortrak, right gaze, left HP, SBP <160 Restrictions Weight Bearing Restrictions: No    Mobility  Bed Mobility Overal bed mobility: Needs Assistance Bed Mobility: Sidelying to Sit;Rolling Rolling: Max assist Sidelying to sit: Max assist          Transfers Overall transfer level: Needs assistance Equipment used: 1 person hand held assist Transfers: Stand Pivot Transfers;Sit to/from Stand Sit to Stand: Max assist Stand pivot transfers: Max assist       General transfer comment: partial stand pivot to drop arm recliner with lift pad under patient for lift transfer back to bed by nursing when ready.  Ambulation/Gait             General Gait Details: unable   Stairs             Wheelchair Mobility     Modified Rankin (Stroke Patients Only) Modified Rankin (Stroke Patients Only) Pre-Morbid Rankin Score: No symptoms Modified Rankin: Severe disability     Balance Overall balance assessment: Needs assistance Sitting-balance support: Feet supported Sitting balance-Leahy Scale: Zero Sitting balance - Comments: L lateral and posterior lean in sitting EOB   Standing balance support: Single extremity supported;During functional activity Standing balance-Leahy Scale: Zero                              Cognition Arousal/Alertness: Lethargic Behavior During Therapy: Flat affect Overall Cognitive Status: Impaired/Different from baseline                                        Exercises General Exercises - Lower Extremity Hip ABduction/ADduction: PROM;Left;Supine;10 reps Toe Raises: PROM;Left;Supine;10 reps Heel Raises: PROM;Left;Supine;10 reps    General Comments        Pertinent Vitals/Pain Pain Assessment: No/denies pain    Home Living                      Prior Function            PT Goals (current goals can now be found in the care plan section) Acute Rehab PT Goals Patient Stated Goal: return home PT Goal Formulation: With patient Time For Goal Achievement: 05/05/18 Potential to Achieve Goals: Fair Progress towards PT goals: Not progressing toward goals - comment(lethargy)  Frequency    Min 4X/week      PT Plan Current plan remains appropriate    Co-evaluation              AM-PAC PT "6 Clicks" Mobility   Outcome Measure  Help needed turning from your back to your side while in a flat bed without using bedrails?: Total Help needed moving from lying on your back to sitting on the side of a flat bed without using bedrails?: Total Help needed moving to and from a bed to a chair (including a wheelchair)?: Total Help needed standing up from a chair using your arms (e.g., wheelchair or bedside chair)?: Total Help  needed to walk in hospital room?: Total Help needed climbing 3-5 steps with a railing? : Total 6 Click Score: 6    End of Session Equipment Utilized During Treatment: Gait belt Activity Tolerance: Patient limited by fatigue Patient left: in chair;with family/visitor present Nurse Communication: Mobility status PT Visit Diagnosis: Unsteadiness on feet (R26.81);Muscle weakness (generalized) (M62.81);Other abnormalities of gait and mobility (R26.89);Other symptoms and signs involving the nervous system (S85.462)     Time: 7035-0093 PT Time Calculation (min) (ACUTE ONLY): 25 min  Charges:  $Therapeutic Exercise: 8-22 mins $Therapeutic Activity: 8-22 mins                     Katina Dung. Hartnett-Rands, MS, PT [place] [facility] #[id number] 04/26/2018, 12:54 PM

## 2018-04-27 LAB — CBC WITH DIFFERENTIAL/PLATELET
ABS IMMATURE GRANULOCYTES: 0.15 10*3/uL — AB (ref 0.00–0.07)
Basophils Absolute: 0 10*3/uL (ref 0.0–0.1)
Basophils Relative: 0 %
Eosinophils Absolute: 0.1 10*3/uL (ref 0.0–0.5)
Eosinophils Relative: 1 %
HCT: 36.4 % — ABNORMAL LOW (ref 39.0–52.0)
Hemoglobin: 11.8 g/dL — ABNORMAL LOW (ref 13.0–17.0)
Immature Granulocytes: 1 %
Lymphocytes Relative: 9 %
Lymphs Abs: 1.2 10*3/uL (ref 0.7–4.0)
MCH: 26.8 pg (ref 26.0–34.0)
MCHC: 32.4 g/dL (ref 30.0–36.0)
MCV: 82.7 fL (ref 80.0–100.0)
MONO ABS: 1.2 10*3/uL — AB (ref 0.1–1.0)
MONOS PCT: 9 %
NEUTROS ABS: 10.3 10*3/uL — AB (ref 1.7–7.7)
Neutrophils Relative %: 80 %
Platelets: 295 10*3/uL (ref 150–400)
RBC: 4.4 MIL/uL (ref 4.22–5.81)
RDW: 15.6 % — ABNORMAL HIGH (ref 11.5–15.5)
WBC: 12.9 10*3/uL — ABNORMAL HIGH (ref 4.0–10.5)
nRBC: 0 % (ref 0.0–0.2)

## 2018-04-27 LAB — CULTURE, BLOOD (ROUTINE X 2)
Culture: NO GROWTH
Culture: NO GROWTH
Special Requests: ADEQUATE
Special Requests: ADEQUATE

## 2018-04-27 LAB — GLUCOSE, CAPILLARY
Glucose-Capillary: 122 mg/dL — ABNORMAL HIGH (ref 70–99)
Glucose-Capillary: 123 mg/dL — ABNORMAL HIGH (ref 70–99)
Glucose-Capillary: 128 mg/dL — ABNORMAL HIGH (ref 70–99)

## 2018-04-27 LAB — BASIC METABOLIC PANEL
Anion gap: 12 (ref 5–15)
BUN: 19 mg/dL (ref 8–23)
CO2: 22 mmol/L (ref 22–32)
Calcium: 9.4 mg/dL (ref 8.9–10.3)
Chloride: 108 mmol/L (ref 98–111)
Creatinine, Ser: 0.95 mg/dL (ref 0.61–1.24)
GFR calc Af Amer: >60 mL/min (ref >60)
GLUCOSE: 121 mg/dL — AB (ref 70–99)
POTASSIUM: 4.5 mmol/L (ref 3.5–5.1)
Sodium: 142 mmol/L (ref 135–145)

## 2018-04-27 LAB — TRIGLYCERIDES: Triglycerides: 154 mg/dL — ABNORMAL HIGH (ref <150)

## 2018-04-27 MED ORDER — LABETALOL HCL 100 MG PO TABS: 100.0000 mg | ORAL_TABLET | Freq: Three times a day (TID) | ORAL | Status: AC

## 2018-04-27 MED ORDER — PANTOPRAZOLE SODIUM 40 MG PO PACK: 40.0000 mg | PACK | Freq: Every day | ORAL | Status: AC

## 2018-04-27 MED ORDER — VERAPAMIL HCL 40 MG PO TABS: 40.0000 mg | ORAL_TABLET | Freq: Three times a day (TID) | ORAL | Status: DC

## 2018-04-27 MED ORDER — ADULT MULTIVITAMIN W/MINERALS CH: 1.0000 | ORAL_TABLET | Freq: Every day | ORAL | Status: AC

## 2018-04-27 MED ORDER — SENNOSIDES-DOCUSATE SODIUM 8.6-50 MG PO TABS: 1.0000 | ORAL_TABLET | Freq: Two times a day (BID) | ORAL | Status: DC

## 2018-04-27 MED ORDER — QUETIAPINE FUMARATE 25 MG PO TABS: 12.5000 mg | ORAL_TABLET | Freq: Every day | ORAL | 2 refills | Status: DC

## 2018-04-27 MED ORDER — IPRATROPIUM-ALBUTEROL 0.5-2.5 (3) MG/3ML IN SOLN: 3.0000 mL | Freq: Four times a day (QID) | RESPIRATORY_TRACT | Status: AC | PRN

## 2018-04-27 MED ORDER — ENSURE ENLIVE PO LIQD: 237.0000 mL | Freq: Two times a day (BID) | ORAL | 12 refills | Status: AC

## 2018-04-27 NOTE — Progress Notes (Signed)
Patient discharging to [employer] [employer] All belongings sent with wife. Nurse attempted to call report  X 2, no answer.

## 2018-04-27 NOTE — Progress Notes (Signed)
  Speech Language Pathology Treatment: Cognitive-Linquistic;Dysphagia  Patient Details Name: Luis Johnson MRN: [id number] DOB: 1956/03/10 Today's Date: 04/27/2018 Time: 5102-5852 SLP Time Calculation (min) (ACUTE ONLY): 11 min  Assessment / Plan / Recommendation Clinical Impression  Pt preparing for D/C to [facility].  Wife present.  He is tolerating a regular diet with thin liquids with assist for self-feeding.  Today he is quite lethargic, but was observed to drink thin liquids without deficit, no overt  s/s of aspiration.  He is oriented to person, but not place/time/situation despite max prompts for external cues.  Pt requires max assist for sustained attention to dressing, drinking, and constant verbal cues to respond verbally to questions.  He will benefit from SLP intervention at SNF to address cognitive-communication impairments.    HPI HPI: 63 yo admitted with decreased responsiveness from home with large parenchymal intracerebral hemorrhage in the right parietotemporal area. INtubated for airway protection 2/2-2/7. PMhx: HTN, ICH, CVA, amyloid beta related angiitis      SLP Plan  Discharge SLP treatment due to (comment) d/c to SNF       Recommendations  Diet recommendations: Regular;Thin liquid Liquids provided via: Cup;Straw Supervision: Staff to assist with self feeding Compensations: Minimize environmental distractions;Slow rate;Small sips/bites                Follow up Recommendations: Skilled Nursing facility SLP Visit Diagnosis: Cognitive communication deficit (R41.841);Dysphagia, oropharyngeal phase (R13.12) Plan: Discharge SLP treatment due to (comment)       GO                Blenda Mounts Laurice 04/27/2018, 11:08 AM Marchelle Folks L. Samson Frederic, MA CCC/SLP Acute Rehabilitation Services Office number [phone] Pager [phone]

## 2018-04-27 NOTE — Clinical Social Work Placement (Signed)
Nurse to call report to [phone], Room 224     CLINICAL SOCIAL WORK PLACEMENT  NOTE  Date:  04/27/2018  Patient Details  Name: Luis Johnson MRN: [id number] Date of Birth: 10/11/1955  Clinical Social Work is seeking post-discharge placement for this patient at the Skilled  Nursing Facility level of care (*CSW will initial, date and re-position this form in  chart as items are completed):  Yes   Patient/family provided with [facility] Clinical Social Work Department's list of facilities offering this level of care within the geographic area requested by the patient (or if unable, by the patient's family).  Yes   Patient/family informed of their freedom to choose among providers that offer the needed level of care, that participate in Medicare, Medicaid or managed care program needed by the patient, have an available bed and are willing to accept the patient.  Yes   Patient/family informed of [facility]'s ownership interest in [facility] and [facility], as well as of the fact that they are under no obligation to receive care at these facilities.  PASRR submitted to EDS on 04/24/18     PASRR number received on 04/27/18     Existing PASRR number confirmed on       FL2 transmitted to all facilities in geographic area requested by pt/family on 04/24/18     FL2 transmitted to all facilities within larger geographic area on       Patient informed that his/her managed care company has contracts with or will negotiate with certain facilities, including the following:        Yes   Patient/family informed of bed offers received.  Patient chooses bed at [facility]     Physician recommends and patient chooses bed at      Patient to be transferred to [facility] on 04/27/18.  Patient to be transferred to facility by [facility]AR     Patient family notified on 04/27/18 of transfer.  Name of family member notified:  Wife     PHYSICIAN        Additional Comment:    _______________________________________________ Baldemar Lenis, LCSW 04/27/2018, 10:48 AM

## 2018-05-01 ENCOUNTER — Non-Acute Institutional Stay (SKILLED_NURSING_FACILITY): Payer: Medicare Other | Admitting: Internal Medicine

## 2018-05-01 ENCOUNTER — Encounter: Payer: Self-pay | Admitting: Internal Medicine

## 2018-05-01 DIAGNOSIS — I611 Nontraumatic intracerebral hemorrhage in hemisphere, cortical: Secondary | ICD-10-CM | POA: Diagnosis not present

## 2018-05-01 DIAGNOSIS — I10 Essential (primary) hypertension: Secondary | ICD-10-CM | POA: Diagnosis not present

## 2018-05-01 NOTE — Assessment & Plan Note (Signed)
05/01/2018 repeat blood pressure 82/61 Beta-blocker 100 mg every 8 hours will be held if systolic is less than 100

## 2018-05-01 NOTE — Progress Notes (Signed)
NURSING HOME LOCATION:  [facility] ROOM NUMBER:  [id number]  PCP:  [facility] [place], [place] Neurology: [facility]  This is a comprehensive admission note to [facility] performed on this date less than 30 days from date of admission. Included are preadmission medical/surgical history; reconciled medication list; family history; social history and comprehensive review of systems.  Corrections and additions to the records were documented. Comprehensive physical exam was also performed. Additionally a clinical summary was entered for each active diagnosis pertinent to this admission in the Problem List to enhance continuity of care.  HPI: Patient was hospitalized 2/2-2/13/2020 with a large right hemisphere multilobar intracerebral hemorrhage, consistent with complication of cerebral amyloid angiopathy.  Approximate volume 112 mL with significant right-left shift of 8 mm with early uncal herniation. The patient had been complaining of a headache since 4:30 PM on 2/1.  At approximately 5 PM he lay on the floor & would not get up.  The morning of admission the patient's wife called EMS because of concern for his altered mental status.  EMS documented flaccidity on the left side.  In the ED GCS scale was 11.  Patient was intubated on admission and remained intubated until 04/21/2018.  Course was complicated by hypernatremia and dysphagia.  Initially he received tube feedings but passed the MBS with SLP recommending regular diet with thin liquids.  He initially received Zosyn for temperatures up to 102.7.  White count was minimally elevated at 10,400.  Tylenol as needed was recommended for central fever.  He received Cleviprex in the ICU for hypertensive emergency.  AKI precluded the use of ACE inhibitor.  Creatinine dropped from 2.45 to a value of 0.95 at discharge.  Seroquel 12.5 mg at bedtime was prescribed for agitation.  Ensure  Enlive was initiated for moderate  malnutrition.  Pravastatin 80 mg was held because of the ICH with resumption at discharge. 81 mg aspirin prophylaxis was discontinued at admission.  Aspirin anticoagulation was contraindicated lifetime because of the intracranial hemorrhage as per Neurology. 2/13 labs: glucose 121, and hemoglobin 11.8/hematocrit 36.4.  Indices were normochromic, normocytic. His wife works at this SNF and he was discharged here at her request.  Past medical and surgical history: He has a prior history of ICH, amyloid beta related angiitis and inflammation, essential hypertension, and non-aneurysmal bleed in the past.  He has had several interventional radiology vascular procedures as well as arterial biopsies.  Social history: Former smoker, quit 1995.  Nondrinker.  Family history: Reviewed   Review of systems: Staff reports that the  patient complained of back pain today.  There was no injury.  He was noted to have tachypnea with respiratory rate of 23 and heart rate of 111.  Blood pressure was normal at 115/82.  O2 sats were 87% on RA. 3 L of nasal oxygen were required to maintain O2 sats of 91%.   According to his wife  the patient was able to move the toes and fingers of the left extremities at the time of discharge from the hospital.  When I saw the patient he was nonverbal and profoundly somnolent.  Apparently according to staff,somnolence has been a intermittently recurrent issue.  The patient did have PT/OT earlier today.  No other medical history could be obtained.  Physical exam:  Pertinent or positive findings: As noted he was sleeping soundly when I entered the room.  He would not open his eyes or mouth for exam.  When his eyes were opened  passively, there was disconjugate gaze.  His wife was able to get him to open his mouth partially.  He is missing multiple teeth. He is Transport planner.  The left nasolabial fold is decreased compared to the right.  Chest was surprisingly clear.  Heart rhythm is regular.Abdomen  protuberant.  Repeat pulse check was 82 respiratory rate 16.  O2 sats were 94% on oxygen.  Blood pressure was 82/61. He did move his right upper extremity and right lower extremity to some extent.  There was no voluntary muscular activity on the left.  When a limb was lifted & loosed,it dropped to the bed.  There was increased tone in the lower extremities.  Pedal pulses were decreased.  With noxious stimulus to the right chest, he raised the right hand to this area.  There was no response to a straight leg raising bilaterally.  General appearance: Adequately nourished; no acute distress, increased work of breathing is present.   Lymphatic: No lymphadenopathy about the head, neck, axilla. Eyes: No conjunctival inflammation or lid edema is present. There is no scleral icterus. Ears:  External ear exam shows no significant lesions or deformities.   Nose:  External nasal examination shows no deformity or inflammation. Nasal mucosa are pink and moist without lesions, exudates Oral exam: Lips and gums are healthy appearing. Neck:  No thyromegaly, masses, tenderness noted.    Heart:  No gallop, murmur, click, rub with VS as noted above.  Lungs:  without wheezes, rhonchi, rales, rubs. Abdomen:  Abdomen is soft and nontender with no organomegaly, hernias, masses. GU: Deferred  Extremities:  No cyanosis, clubbing, edema. Neurologic exam: Balance, Rhomberg, finger to nose testing could not be completed due to clinical state Skin: Warm & dry w/o tenting. No significant lesions or rash.  See clinical summary under each active problem in the Problem List with associated updated therapeutic plan

## 2018-05-01 NOTE — Progress Notes (Signed)
This encounter was created in error - please disregard.

## 2018-05-01 NOTE — Assessment & Plan Note (Addendum)
PT/OT at [facility] F/U with [facility]

## 2018-05-02 ENCOUNTER — Encounter (HOSPITAL_COMMUNITY): Payer: Self-pay | Admitting: Family Medicine

## 2018-05-02 ENCOUNTER — Encounter: Payer: Self-pay | Admitting: Internal Medicine

## 2018-05-02 ENCOUNTER — Inpatient Hospital Stay (HOSPITAL_COMMUNITY)
Admission: EM | Admit: 2018-05-02 | Discharge: 2018-05-14 | DRG: 175 | Disposition: E | Payer: Medicare Other | Attending: Family Medicine | Admitting: Family Medicine

## 2018-05-02 ENCOUNTER — Emergency Department (HOSPITAL_COMMUNITY): Payer: Medicare Other

## 2018-05-02 ENCOUNTER — Encounter (HOSPITAL_COMMUNITY): Payer: Self-pay | Admitting: Emergency Medicine

## 2018-05-02 ENCOUNTER — Other Ambulatory Visit: Payer: Self-pay

## 2018-05-02 ENCOUNTER — Non-Acute Institutional Stay (SKILLED_NURSING_FACILITY): Payer: Medicare Other | Admitting: Internal Medicine

## 2018-05-02 DIAGNOSIS — R0600 Dyspnea, unspecified: Secondary | ICD-10-CM

## 2018-05-02 DIAGNOSIS — J9601 Acute respiratory failure with hypoxia: Secondary | ICD-10-CM | POA: Diagnosis present

## 2018-05-02 DIAGNOSIS — R0602 Shortness of breath: Secondary | ICD-10-CM | POA: Diagnosis present

## 2018-05-02 DIAGNOSIS — R0603 Acute respiratory distress: Secondary | ICD-10-CM

## 2018-05-02 DIAGNOSIS — E785 Hyperlipidemia, unspecified: Secondary | ICD-10-CM | POA: Diagnosis present

## 2018-05-02 DIAGNOSIS — Z91012 Allergy to eggs: Secondary | ICD-10-CM

## 2018-05-02 DIAGNOSIS — Z8249 Family history of ischemic heart disease and other diseases of the circulatory system: Secondary | ICD-10-CM | POA: Diagnosis not present

## 2018-05-02 DIAGNOSIS — K219 Gastro-esophageal reflux disease without esophagitis: Secondary | ICD-10-CM | POA: Diagnosis present

## 2018-05-02 DIAGNOSIS — F329 Major depressive disorder, single episode, unspecified: Secondary | ICD-10-CM | POA: Diagnosis present

## 2018-05-02 DIAGNOSIS — I10 Essential (primary) hypertension: Secondary | ICD-10-CM | POA: Diagnosis present

## 2018-05-02 DIAGNOSIS — R059 Cough, unspecified: Secondary | ICD-10-CM

## 2018-05-02 DIAGNOSIS — E854 Organ-limited amyloidosis: Secondary | ICD-10-CM | POA: Diagnosis present

## 2018-05-02 DIAGNOSIS — I2602 Saddle embolus of pulmonary artery with acute cor pulmonale: Secondary | ICD-10-CM | POA: Diagnosis present

## 2018-05-02 DIAGNOSIS — Z515 Encounter for palliative care: Secondary | ICD-10-CM | POA: Diagnosis not present

## 2018-05-02 DIAGNOSIS — Z820 Family history of epilepsy and other diseases of the nervous system: Secondary | ICD-10-CM | POA: Diagnosis not present

## 2018-05-02 DIAGNOSIS — I69359 Hemiplegia and hemiparesis following cerebral infarction affecting unspecified side: Secondary | ICD-10-CM | POA: Diagnosis not present

## 2018-05-02 DIAGNOSIS — I2692 Saddle embolus of pulmonary artery without acute cor pulmonale: Secondary | ICD-10-CM | POA: Diagnosis present

## 2018-05-02 DIAGNOSIS — Z79899 Other long term (current) drug therapy: Secondary | ICD-10-CM

## 2018-05-02 DIAGNOSIS — Z91018 Allergy to other foods: Secondary | ICD-10-CM

## 2018-05-02 DIAGNOSIS — N179 Acute kidney failure, unspecified: Secondary | ICD-10-CM | POA: Diagnosis present

## 2018-05-02 DIAGNOSIS — J189 Pneumonia, unspecified organism: Secondary | ICD-10-CM | POA: Diagnosis present

## 2018-05-02 DIAGNOSIS — I611 Nontraumatic intracerebral hemorrhage in hemisphere, cortical: Secondary | ICD-10-CM | POA: Diagnosis not present

## 2018-05-02 DIAGNOSIS — Z66 Do not resuscitate: Secondary | ICD-10-CM | POA: Diagnosis present

## 2018-05-02 DIAGNOSIS — R05 Cough: Secondary | ICD-10-CM

## 2018-05-02 DIAGNOSIS — Z823 Family history of stroke: Secondary | ICD-10-CM

## 2018-05-02 DIAGNOSIS — J439 Emphysema, unspecified: Secondary | ICD-10-CM | POA: Diagnosis present

## 2018-05-02 DIAGNOSIS — R0902 Hypoxemia: Secondary | ICD-10-CM

## 2018-05-02 DIAGNOSIS — I680 Cerebral amyloid angiopathy: Secondary | ICD-10-CM | POA: Diagnosis present

## 2018-05-02 DIAGNOSIS — I248 Other forms of acute ischemic heart disease: Secondary | ICD-10-CM | POA: Diagnosis present

## 2018-05-02 DIAGNOSIS — Z87891 Personal history of nicotine dependence: Secondary | ICD-10-CM | POA: Diagnosis not present

## 2018-05-02 DIAGNOSIS — Y95 Nosocomial condition: Secondary | ICD-10-CM | POA: Diagnosis present

## 2018-05-02 DIAGNOSIS — R0609 Other forms of dyspnea: Secondary | ICD-10-CM | POA: Diagnosis not present

## 2018-05-02 DIAGNOSIS — I2510 Atherosclerotic heart disease of native coronary artery without angina pectoris: Secondary | ICD-10-CM | POA: Diagnosis present

## 2018-05-02 DIAGNOSIS — I69154 Hemiplegia and hemiparesis following nontraumatic intracerebral hemorrhage affecting left non-dominant side: Secondary | ICD-10-CM | POA: Diagnosis not present

## 2018-05-02 DIAGNOSIS — I619 Nontraumatic intracerebral hemorrhage, unspecified: Secondary | ICD-10-CM | POA: Diagnosis present

## 2018-05-02 DIAGNOSIS — G936 Cerebral edema: Secondary | ICD-10-CM | POA: Diagnosis present

## 2018-05-02 HISTORY — DX: Acute respiratory failure with hypoxia: J96.01

## 2018-05-02 LAB — COMPREHENSIVE METABOLIC PANEL
ALT: 129 U/L — ABNORMAL HIGH (ref 0–44)
AST: 58 U/L — ABNORMAL HIGH (ref 15–41)
Albumin: 2.6 g/dL — ABNORMAL LOW (ref 3.5–5.0)
Alkaline Phosphatase: 129 U/L — ABNORMAL HIGH (ref 38–126)
Anion gap: 16 — ABNORMAL HIGH (ref 5–15)
BUN: 58 mg/dL — ABNORMAL HIGH (ref 8–23)
CO2: 18 mmol/L — ABNORMAL LOW (ref 22–32)
CREATININE: 1.91 mg/dL — AB (ref 0.61–1.24)
Calcium: 10.1 mg/dL (ref 8.9–10.3)
Chloride: 103 mmol/L (ref 98–111)
GFR calc Af Amer: 42 mL/min — ABNORMAL LOW (ref >60)
GFR calc non Af Amer: 36 mL/min — ABNORMAL LOW (ref >60)
Glucose, Bld: 188 mg/dL — ABNORMAL HIGH (ref 70–99)
Potassium: 4.9 mmol/L (ref 3.5–5.1)
Sodium: 137 mmol/L (ref 135–145)
Total Bilirubin: 0.9 mg/dL (ref 0.3–1.2)
Total Protein: 7.7 g/dL (ref 6.5–8.1)

## 2018-05-02 LAB — I-STAT TROPONIN, ED: Troponin i, poc: 0.23 ng/mL (ref 0.00–0.08)

## 2018-05-02 LAB — CBC WITH DIFFERENTIAL/PLATELET
Abs Immature Granulocytes: 0.12 10*3/uL — ABNORMAL HIGH (ref 0.00–0.07)
Basophils Absolute: 0 10*3/uL (ref 0.0–0.1)
Basophils Relative: 0 %
Eosinophils Absolute: 0 10*3/uL (ref 0.0–0.5)
Eosinophils Relative: 0 %
HCT: 42.5 % (ref 39.0–52.0)
HEMOGLOBIN: 13.5 g/dL (ref 13.0–17.0)
Immature Granulocytes: 1 %
Lymphocytes Relative: 4 %
Lymphs Abs: 0.5 10*3/uL — ABNORMAL LOW (ref 0.7–4.0)
MCH: 26.6 pg (ref 26.0–34.0)
MCHC: 31.8 g/dL (ref 30.0–36.0)
MCV: 83.7 fL (ref 80.0–100.0)
Monocytes Absolute: 0.4 10*3/uL (ref 0.1–1.0)
Monocytes Relative: 3 %
Neutro Abs: 13.3 10*3/uL — ABNORMAL HIGH (ref 1.7–7.7)
Neutrophils Relative %: 92 %
Platelets: 382 10*3/uL (ref 150–400)
RBC: 5.08 MIL/uL (ref 4.22–5.81)
RDW: 15.8 % — ABNORMAL HIGH (ref 11.5–15.5)
WBC: 14.4 10*3/uL — ABNORMAL HIGH (ref 4.0–10.5)
nRBC: 0.1 % (ref 0.0–0.2)

## 2018-05-02 LAB — LACTIC ACID, PLASMA
Lactic Acid, Venous: 3 mmol/L (ref 0.5–1.9)
Lactic Acid, Venous: 1.3 mmol/L (ref 0.5–1.9)

## 2018-05-02 LAB — LIPASE, BLOOD: Lipase: 34 U/L (ref 11–51)

## 2018-05-02 MED ORDER — SODIUM CHLORIDE 0.9 % IV SOLN: 2.0000 g | Freq: Once | INTRAVENOUS | Status: DC

## 2018-05-02 MED ORDER — IOPAMIDOL (ISOVUE-370) INJECTION 76%
75.0000 mL | Freq: Once | INTRAVENOUS | Status: AC | PRN
  Administered 2018-05-02: 75 mL via INTRAVENOUS

## 2018-05-02 MED ORDER — VANCOMYCIN HCL IN DEXTROSE 1-5 GM/200ML-% IV SOLN: 1000.0000 mg | Freq: Once | INTRAVENOUS | Status: DC

## 2018-05-02 MED ORDER — VANCOMYCIN HCL 10 G IV SOLR
1500.0000 mg | Freq: Once | INTRAVENOUS | Status: AC
  Administered 2018-05-02: 1500 mg via INTRAVENOUS
  Filled 2018-05-02: qty 1500

## 2018-05-02 MED ORDER — MORPHINE SULFATE (PF) 2 MG/ML IV SOLN
2.0000 mg | INTRAVENOUS | Status: DC | PRN
  Administered 2018-05-02 – 2018-05-03 (×3): 2 mg via INTRAVENOUS
  Filled 2018-05-02 (×3): qty 1

## 2018-05-02 MED ORDER — SODIUM CHLORIDE 0.9 % IV SOLN
INTRAVENOUS | Status: DC
  Administered 2018-05-02 – 2018-05-03 (×3): via INTRAVENOUS

## 2018-05-02 MED ORDER — SODIUM CHLORIDE 0.9 % IV SOLN
2.0000 g | INTRAVENOUS | Status: DC
  Administered 2018-05-02: 2 g via INTRAVENOUS
  Filled 2018-05-02 (×2): qty 2

## 2018-05-02 MED ORDER — IOPAMIDOL (ISOVUE-370) INJECTION 76%
INTRAVENOUS | Status: AC
  Filled 2018-05-02: qty 100

## 2018-05-02 MED ORDER — SODIUM CHLORIDE 0.9 % IV BOLUS
1000.0000 mL | Freq: Once | INTRAVENOUS | Status: AC
  Administered 2018-05-02: 1000 mL via INTRAVENOUS

## 2018-05-02 MED ORDER — MORPHINE SULFATE (PF) 2 MG/ML IV SOLN
INTRAVENOUS | Status: AC
  Administered 2018-05-02: 2 mg via INTRAMUSCULAR
  Filled 2018-05-02: qty 1

## 2018-05-02 MED ORDER — VANCOMYCIN HCL IN DEXTROSE 750-5 MG/150ML-% IV SOLN
750.0000 mg | INTRAVENOUS | Status: DC
  Filled 2018-05-02: qty 150

## 2018-05-02 MED ORDER — MORPHINE SULFATE (PF) 2 MG/ML IV SOLN
2.0000 mg | Freq: Once | INTRAVENOUS | Status: AC
  Administered 2018-05-02: 2 mg via INTRAVENOUS

## 2018-05-02 NOTE — Consult Note (Signed)
NAME:  Luis Johnson, MRN:  [id number], DOB:  1955-05-12, LOS: 0 ADMISSION DATE:  01/16/19, CONSULTATION DATE:  04/17/18 REFERRING MD: Lynden Oxfordegeler, Christopher CHIEF COMPLAINT:  PE, Pneumonia, respiratory distress.   Brief History   Luis Polesony Barradas is a 63 y.o. male past medical history significant for hypertension, hyperlipidemia, COPD, and recent hemorrhagic stroke ( discharged to rehab  2/15)  who presents from his rehab facility for respiratory distress.  Patient brought in by EMS . Family realize patient was having difficulty breathing and called EMS, who noted  his oxygen saturations were in the 60s on arrival. Sats improved only slightly on Non-re-breather. nonrebreather . He presented with respiratory distress, tachycardia, wheezing and rhonchi with desaturations requiring BiPAP. CTA was + for PE and pneumonia. PCCM have been asked to consult for assistance with treatment for pneumonia and PE.  History of present illness   Luis Polesony Kasinger is a 63 y.o. male past medical history significant for hypertension, hyperlipidemia, COPD, and recent hemorrhagic stroke ( discharged to rehab  2/15)  who presents from his rehab facility for respiratory distress. He has had decreased mobility .  Patient brought in by EMS . Family realize patient was having difficulty breathing and called EMS, who noted  his oxygen saturations were in the 60s on arrival. Sats improved only slightly on Non-re-breather. He presented to the ED  with respiratory distress, tachycardia, wheezing and rhonchi with desaturations requiring BiPAP. He has improved on BiPAP. He did tell EMS he was having chest pain while en route.CTA was + for PE and pneumonia. PCCM have been asked to consult for assistance with treatment for pneumonia and PE.  CT Head on admission was still positive for intraparenchymal hemorrhage centered in the right parietal lobe. There is similar edema and sulcal effacement with approximately 6 mm right-to-left midline shift, not  significantly changed compared to prior examination. No evidence of new hemorrhage. Additionally CXR was + for new opacity at the left lung base suspicious for pneumonia, and labs were + for leukocytosis ( 14.4) Lactic acid was elevated initially at 3.0, but has responded to fluids and repeat was 1.3.. Anticoagulation and EKOS were ruled out as options for care due to his recent hemorraghic bleed, and continued blood noted on CT head today.. Filter placement can be considered by IR. PCCM have been consulted to assist with management of pneumonia and hypoxemia.   Past Medical History  ICH, SAH, HTN, amyloid angiopathy, recent hemorrhagic stroke 04/2018.  Significant Hospital Events   2/18 >> Admission  Consults:  PCCM.  Procedures:  BiPAP 2/18  Significant Diagnostic Tests:  2/18>>CT Head  Interval decrease in volume and attenuation of a intraparenchymal hemorrhage centered in the right parietal lobe. There is similar edema and sulcal effacement with approximately 6 mm right-to-left midline shift, not significantly changed compared to prior examination. No evidence of new hemorrhage.  01/16/19 CTA Positive examination for pulmonary embolism with saddle embolus and a large burden of lobar and more distal embolism present throughout. There is near occlusive embolus present in the right intralobar artery and more distally in the lobar and segmental arteries of the bilateral lower lobes. Embolus is present in all lobes although at the segmental level and more distally in the upper lobes.  There is enlargement of the RV LV ratio to approximately 1.5, concerning for right heart strain.  There is subpleural ground-glass opacity of the left greater than right lung bases, concerning for developing pulmonary infarctions distal to occlusive embolus.   Echo 04/17/2018  Left ventricle has hyperdynamic systolic function of >65%. The cavity size is normal.  Echo   Micro Data:    05-21-18 Blood Cultures>>  Antimicrobials:  Maxipime May 21, 2018>> Vanc 21-May-2018>>  Interim history/subjective:  Appears comfortable on BiPAP, sats have improved, WOB has improved.  Objective:  Blood pressure (!) 124/91, pulse (!) 106, temperature 98.3 F (36.8 C), temperature source Axillary, resp. rate (!) 23, SpO2 94 %.       No intake or output data in the 24 hours ending 21-May-2018 1530 There were no vitals filed for this visit.  Examination: General: Adult male, in NAD, on BiPAP. Neuro: Arouses to stimulation, follows few commands, decreased strength left side, lethargic HEENT: [place]/AT. Sclerae anicteric.  BiPAP mask Cardiovascular: S1, S2, RRR, no M/R/G.  Lungs:Bilateral chest excursion noted, few rhonchi noted on auscultation  Abdomen: BS x 4, soft, NT/ND.  Musculoskeletal: No gross deformities, no edema.  Skin: Intact, warm, no rashes or lesions.  Assessment & Plan:   Acute Respiratory insufficiency - due to new PE and new opacity at the left lung base - BiPAP for now, continue current settings - NPO - Titrate oxygen and BiPAP settings  for synchrony and sats > 94% - When more stable consider short periods off BiPap for rest  - Aggressive Bronchial hygiene. 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 Unable to anticoagulate 2/2 recent  intraparenchymal hemorrhage centered in the right parietal lobe IR have ruled out EKOS as an option Plan: Support with Bipap  LE Doppler studies Saturation goal > 92% Consider placement of IVC filter as unable to use anticoagulant therapy if doppler studies are + for DVT Consider embolectomy  Echo to evaluate Right heart Strain Further imaging over time to evaluate  resolution of PE as anticoagulation is not an option  There is a need for goals of care discussion with  this patient, as he has multi-system organ failure with limited options for  treatment of PE.     Best Practice:  Diet: NPO.  Start TF's. Pain/Anxiety/Delirium protocol (if indicated): Propofol gtt / Fetanyl PRN.  RASS goal 0 to -1. VAP protocol (if indicated): In place. DVT prophylaxis: SCD's. GI prophylaxis: PPI. Glucose control: SSI. Mobility: Bedrest. Code Status: Full.  Family Communication: None available. Disposition: ICU.  Labs   CBC: Recent Labs  Lab 04/26/18 0517 04/27/18 0559 May 21, 2018 0957  WBC 13.9* 12.9* 14.4*  NEUTROABS 11.2* 10.3* 13.3*  HGB 12.0* 11.8* 13.5  HCT 37.7* 36.4* 42.5  MCV 83.8 82.7 83.7  PLT 216 295 382   Basic Metabolic Panel: Recent Labs  Lab 04/26/18 0517 04/27/18 0559 2018/05/21 0957  NA 142 142 137  K 4.2 4.5 4.9  CL 111 108 103  CO2 25 22 18*  GLUCOSE 137* 121* 188*  BUN 20 19 58*  CREATININE 1.02 0.95 1.91*  CALCIUM 8.9 9.4 10.1   GFR: Estimated Creatinine Clearance: 32.7 mL/min (A) (by C-G formula based on SCr of 1.91 mg/dL (H)). Recent Labs  Lab 04/26/18 0517 04/27/18 0559 2018/05/21 0941 05-21-2018 0957 2018-05-21 1210  WBC 13.9* 12.9*  --  14.4*  --   LATICACIDVEN  --   --  3.0*  --  1.3   Liver Function Tests: Recent Labs  Lab 05-21-18 0957  AST 58*  ALT 129*  ALKPHOS 129*  BILITOT 0.9  PROT 7.7  ALBUMIN 2.6*   Recent Labs  Lab  05/08/2018 0957  LIPASE 34   No results for input(s): AMMONIA in the last 168 hours. ABG    Component Value Date/Time   PHART 7.376 04/16/2018 1053   PCO2ART 48.2 (H) 04/16/2018 1053   PO2ART 372.0 (H) 04/16/2018 1053   HCO3 28.3 (H) 04/16/2018 1053   TCO2 30 04/16/2018 1053   O2SAT 100.0 04/16/2018 1053    Coagulation Profile: No results for input(s): INR, PROTIME in the last 168 hours. Cardiac Enzymes: No results for input(s): CKTOTAL, CKMB, CKMBINDEX, TROPONINI in the last 168 hours. HbA1C: Hgb A1c MFr Bld  Date/Time Value Ref Range Status  04/22/2018 06:47 AM 6.1 (H) 4.8 - 5.6 % Final    Comment:    (NOTE) Pre diabetes:           5.7%-6.4% Diabetes:              >6.4% Glycemic control for   <7.0% adults with diabetes   06/01/2017 05:51 AM 5.5 4.8 - 5.6 % Final    Comment:    (NOTE) Pre diabetes:          5.7%-6.4% Diabetes:              >6.4% Glycemic control for   <7.0% adults with diabetes    CBG: Recent Labs  Lab 04/26/18 1655 04/26/18 2018 04/27/18 0002 04/27/18 0344 04/27/18 0723  GLUCAP 109* 105* 122* 128* 123*    Review of Systems:   Unable to obtain as pt is lethargic and on BiPAP  Past medical history  He,  has a past medical history of Hypertension and Stroke (HCC).   Surgical History    Past Surgical History:  Procedure Laterality Date  . ARTERY BIOPSY Bilateral 06/03/2017   Procedure: BILATERAL TEMPORAL ARTERY BIOPSY;  Surgeon: Fransisco Hertz, MD;  Location: [facility] OR;  Service: Vascular;  Laterality: Bilateral;  . IR 3D INDEPENDENT WKST  06/02/2017  . IR ANGIO INTRA EXTRACRAN SEL COM CAROTID INNOMINATE BILAT MOD SED  06/02/2017  . IR ANGIO VERTEBRAL SEL VERTEBRAL BILAT MOD SED  06/02/2017  . SHOULDER SURGERY       Social History   reports that he quit smoking about 24 years ago. He has never used smokeless tobacco. He reports that he does not drink alcohol or use drugs.   Family history   His family history includes Alzheimer's disease in his mother; Heart attack in his brother; Heart failure in his mother; Intracerebral hemorrhage in his mother; Seizures in his mother; Stroke in his brother.   Allergies Allergies  Allergen Reactions  . Eggs Or Egg-Derived Products Anaphylaxis and Swelling    Throat and body swells (can only tolerated boiled eggs)  . Acetic Acid Other (See Comments)    Pt had initial headache and believes it led to brain bleeds and blindness  . 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 Other Other (See Comments)    Headaches lead to blindness oftentimes (starts with eyes fluttering, then progresses to stuttering)      Home meds  Prior to Admission medications   Medication Sig Start Date End Date Taking? Authorizing Provider  acetaminophen (TYLENOL) 325 MG tablet Take 1,000 mg by mouth 3 (three) times daily as needed for mild pain.    Yes [provider]  acetaminophen-codeine (TYLENOL #3) 300-30 MG tablet Take 1 tablet by mouth every 8 (eight) hours as needed. Shoulder [pain   Yes [provider]  albuterol-ipratropium (COMBIVENT) 18-103 MCG/ACT inhaler Inhale 1 puff into the lungs every 4 (four) hours as needed for wheezing or shortness of breath.   Yes [provider]  aspirin EC 81 MG tablet Take 81 mg by mouth once a week. On Sunday   Yes [provider]  Cholecalciferol 50 MCG (2000 UT) TABS Take 1 tablet by mouth daily.   Yes [provider]  divalproex (DEPAKOTE ER) 500 MG 24 hr tablet Take 1 tablet (500 mg total) by mouth daily. Patient taking differently: Take 500 mg by mouth 2 (two) times daily.  12/23/17  Yes George Hugh, NP  EPINEPHrine 0.3 mg/0.3 mL IJ SOAJ injection Inject 0.3 mg into the muscle daily as needed (anaphylaxis).    Yes [provider]  escitalopram (LEXAPRO) 10 MG tablet Take 10 mg by mouth daily.   Yes [provider]  fluticasone (FLONASE) 50 MCG/ACT nasal spray Place 2 sprays into both nostrils daily as needed for allergies. 12/08/17  Yes [provider]  lisinopril-hydrochlorothiazide (PRINZIDE,ZESTORETIC) 20-12.5 MG tablet Take 1 tablet by mouth daily.   Yes [provider]  mometasone (ASMANEX) 220 MCG/INH inhaler Inhale 2 puffs into the lungs 2 (two) times daily.   Yes [provider]  Multiple Vitamin (MULTIVITAMIN) tablet Take 1 tablet by mouth daily.   Yes [provider]  Omega-3 Fatty Acids (FISH OIL) 1000 MG CPDR Take 1,000 tablets by mouth daily.   Yes [provider]  pantoprazole (PROTONIX) 40 MG tablet Take 1 tablet (40 mg total) by mouth daily.  06/03/17 06/03/18 Yes Standley Brooking, MD  pravastatin (PRAVACHOL) 80 MG tablet Take 80 mg by mouth daily. 12/13/17  Yes [provider]  REFRESH TEARS 0.5 % SOLN Place 1 drop into both eyes daily as needed for dry eyes. 04/12/18  Yes [provider]  verapamil (CALAN-SR) 120 MG CR tablet Take 120 mg by mouth 2 (two) times daily. 03/29/18  Yes [provider]    Critical care time: 35 min.    Bevelyn Ngo, AGACNP-BC [facility] Pulmonary/Critical Care Medicine Pager # [phone] After 4 pm please call [phone]  May 16, 2018, 3:30 PM

## 2018-05-02 NOTE — Progress Notes (Signed)
Patient transported from ED to 2W14 on BIPAP w/o complication.  Jacqulynn Cadet RRT

## 2018-05-02 NOTE — Progress Notes (Signed)
Attempted to wean patient off Bipap patient was not off 15 seconds before he begin to drop his sats and wob increased.  I placed patient back on Bipap patient should remain on Bipap for the rest of the night and possibly  wean off tomorrow if tolerated.

## 2018-05-02 NOTE — ED Notes (Signed)
Condom cath is hooked up.

## 2018-05-02 NOTE — ED Provider Notes (Signed)
[facility] EMERGENCY DEPARTMENT Provider Note   CSN: [id number] Arrival date & time: 04/26/2018  0930    History   Chief Complaint Chief Complaint  Patient presents with  . Respiratory Distress    HPI Luis Johnson is a 63 y.o. male.     The history is provided by the patient and medical records. No language interpreter was used.  Shortness of Breath  Severity:  Severe Onset quality:  Unable to specify Duration:  1 day Timing:  Constant Progression:  Unchanged Chronicity:  New Relieved by:  Nothing Worsened by:  Nothing Ineffective treatments:  None tried Associated symptoms: chest pain   Associated symptoms: no abdominal pain, no cough, no diaphoresis, no fever, no headaches, no rash and no syncope   Risk factors: no hx of PE/DVT     Past Medical History:  Diagnosis Date  . Hypertension   . Stroke [facility])     Patient Active Problem List   Diagnosis Date Noted  . History of hemorrhagic stroke  (HCC) 04/26/2018  . Cytotoxic cerebral edema (HCC) 04/26/2018  . Hypernatremia, induced 04/26/2018  . Hypertensive emergency 04/26/2018  . Family history of stroke 04/26/2018  . Agitation 04/26/2018  . Malnutrition of moderate degree 04/18/2018  . Respiratory failure (HCC)   . Generalized headaches 06/29/2017  . History of temporal artery biopsy 06/29/2017  . Vision loss 06/01/2017  . Depression 05/31/2017  . HLD (hyperlipidemia) 05/31/2017  . Prostatitis, acute 02/09/2016  . HTN (hypertension) 02/09/2016  . COPD (chronic obstructive pulmonary disease) (HCC) 02/09/2016  . AKI (acute kidney injury) (HCC) 02/09/2016  . Transaminitis 02/09/2016  . Sepsis (HCC)   . UTI (urinary tract infection) 02/08/2016  . Cerebral amyloid angiopathy (HCC) 11/27/2014  . 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 ICH (intracerebral hemorrhage) (HCC) 11/11/2014    Past Surgical History:  Procedure Laterality Date  . ARTERY BIOPSY Bilateral 06/03/2017   Procedure:  BILATERAL TEMPORAL ARTERY BIOPSY;  Surgeon: Fransisco Hertz, MD;  Location: [facility] OR;  Service: Vascular;  Laterality: Bilateral;  . IR 3D INDEPENDENT WKST  06/02/2017  . IR ANGIO INTRA EXTRACRAN SEL COM CAROTID INNOMINATE BILAT MOD SED  06/02/2017  . IR ANGIO VERTEBRAL SEL VERTEBRAL BILAT MOD SED  06/02/2017  . SHOULDER SURGERY          Home Medications    Prior to Admission medications   Medication Sig Start Date End Date Taking? Authorizing Provider  escitalopram (LEXAPRO) 10 MG tablet Take 10 mg by mouth daily.    [provider]  feeding supplement, ENSURE ENLIVE, (ENSURE ENLIVE) LIQD Take 237 mLs by mouth 2 (two) times daily between meals. 04/27/18   Layne Benton, NP  ipratropium-albuterol (DUONEB) 0.5-2.5 (3) MG/3ML SOLN Take 3 mLs by nebulization every 6 (six) hours as needed. 04/27/18   Layne Benton, NP  labetalol (NORMODYNE) 100 MG tablet Take 1 tablet (100 mg total) by mouth 3 (three) times daily. 04/27/18   Layne Benton, NP  Multiple Vitamin (MULTIVITAMIN WITH MINERALS) TABS tablet Take 1 tablet by mouth daily. 04/28/18   Layne Benton, NP  pantoprazole sodium (PROTONIX) 40 mg/20 mL PACK Take 20 mLs (40 mg total) by mouth daily. 04/28/18   Layne Benton, NP  pravastatin (PRAVACHOL) 80 MG tablet Take 80 mg by mouth daily. For 12/13/17   [provider]  QUEtiapine (SEROQUEL) 25 MG tablet Take 25 mg by mouth at bedtime. For depression    [provider]  senna-docusate (  SENOKOT-S) 8.6-50 MG tablet Take 1 tablet by mouth 2 (two) times daily. For constipation    [provider]  verapamil (CALAN) 40 MG tablet Take 40 mg by mouth every 8 (eight) hours. For Hypertension    [provider]    Family History Family History  Problem Relation Age of Onset  . Seizures Mother   . Heart failure Mother   . Intracerebral hemorrhage Mother   . Alzheimer's disease Mother   . Stroke Brother   . Heart attack Brother     Social History Social  History   Tobacco Use  . Smoking status: Former Smoker    Last attempt to quit: 01/21/1994    Years since quitting: 24.2  . Smokeless tobacco: Never Used  Substance Use Topics  . Alcohol use: No  . Drug use: No     Allergies   Eggs or egg-derived products; Acetic acid; Tea; and Other   Review of Systems Review of Systems  Unable to perform ROS: Severe respiratory distress  Constitutional: Negative for diaphoresis and fever.  Respiratory: Positive for shortness of breath. Negative for cough.   Cardiovascular: Positive for chest pain. Negative for syncope.  Gastrointestinal: Negative for abdominal pain.  Skin: Negative for rash.  Neurological: Negative for headaches.     Physical Exam Updated Vital Signs BP (!) 124/91   Pulse (!) 131   Temp 98.3 F (36.8 C) (Axillary)   Resp (!) 33   SpO2 96%   Physical Exam Vitals signs and nursing note reviewed.  Constitutional:      General: He is in acute distress.     Appearance: He is well-developed. He is not ill-appearing, toxic-appearing or diaphoretic.  HENT:     Head: Normocephalic and atraumatic.     Nose: No congestion or rhinorrhea.     Mouth/Throat:     Pharynx: No oropharyngeal exudate or posterior oropharyngeal erythema.  Eyes:     Conjunctiva/sclera: Conjunctivae normal.     Pupils: Pupils are equal, round, and reactive to light.  Neck:     Musculoskeletal: Neck supple. No muscular tenderness.  Cardiovascular:     Rate and Rhythm: Regular rhythm. Tachycardia present.     Pulses: Normal pulses.     Heart sounds: No murmur.  Pulmonary:     Effort: Respiratory distress present.     Breath sounds: Wheezing and rhonchi present.  Chest:     Chest wall: No tenderness.  Abdominal:     Palpations: Abdomen is soft.     Tenderness: There is no abdominal tenderness.  Musculoskeletal:        General: No tenderness.  Skin:    General: Skin is warm and dry.  Neurological:     Mental Status: He is alert.      Motor: Weakness present.      ED Treatments / Results  Labs (all labs ordered are listed, but only abnormal results are displayed) Labs Reviewed  CBC WITH DIFFERENTIAL/PLATELET - Abnormal; Notable for the following components:      Result Value   WBC 14.4 (*)    RDW 15.8 (*)    Neutro Abs 13.3 (*)    Lymphs Abs 0.5 (*)    Abs Immature Granulocytes 0.12 (*)    All other components within normal limits  COMPREHENSIVE METABOLIC PANEL - Abnormal; Notable for the following components:   CO2 18 (*)    Glucose, Bld 188 (*)    BUN 58 (*)    Creatinine, Ser 1.91 (*)  Albumin 2.6 (*)    AST 58 (*)    ALT 129 (*)    Alkaline Phosphatase 129 (*)    GFR calc non Af Amer 36 (*)    GFR calc Af Amer 42 (*)    Anion gap 16 (*)    All other components within normal limits  LACTIC ACID, PLASMA - Abnormal; Notable for the following components:   Lactic Acid, Venous 3.0 (*)    All other components within normal limits  URINE CULTURE  CULTURE, BLOOD (ROUTINE X 2)  CULTURE, BLOOD (ROUTINE X 2)  LIPASE, BLOOD  LACTIC ACID, PLASMA  URINALYSIS, ROUTINE W REFLEX MICROSCOPIC  I-STAT TROPONIN, ED  I-STAT TROPONIN, ED    EKG EKG Interpretation  Date/Time:  Tuesday May 02 2018 09:41:44 EST Ventricular Rate:  129 PR Interval:  144 QRS Duration: 80 QT Interval:  304 QTC Calculation: 445 R Axis:   92 Text Interpretation:  Sinus tachycardia Rightward axis Pulmonary disease pattern Abnormal ECG When compared to prior, faster rate, t waves now more upright in leads 2,3,AVL, AVF, V4-V6.  No STEMI Confirmed by Theda Belfastegeler, Chris ([id number]) on 04/26/2018 9:44:09 AM   Radiology Ct Head Wo Contrast  Result Date: 04/23/2018 CLINICAL DATA:  Follow-up intracranial hemorrhage EXAM: CT HEAD WITHOUT CONTRAST TECHNIQUE: Contiguous axial images were obtained from the base of the skull through the vertex without intravenous contrast. COMPARISON:  CT brain, 04/24/2018 FINDINGS: Brain: Interval decrease in  volume and attenuation of a intraparenchymal hemorrhage centered in the right parietal lobe. There is similar edema and sulcal effacement with approximately 6 mm right-to-left midline shift, not significantly changed compared to prior examination. No evidence of new hemorrhage. Unchanged extensive periventricular white matter hypodensity. Probable encephalomalacia of the left occipital lobe. Vascular: No hyperdense vessel or unexpected calcification. Skull: Normal. Negative for fracture or focal lesion. Sinuses/Orbits: No acute finding. Other: None. IMPRESSION: 1. Interval decrease in volume and attenuation of a intraparenchymal hemorrhage centered in the right parietal lobe. There is similar edema and sulcal effacement with approximately 6 mm right-to-left midline shift, not significantly changed compared to prior examination. No evidence of new hemorrhage. 2. Underlying small-vessel white matter disease and probable encephalomalacia of the left occipital lobe. Electronically Signed   By: Lauralyn PrimesAlex  Bibbey M.D.   On: 05/09/2018 14:31   Ct Angio Chest Pe W And/or Wo Contrast  Result Date: 04/15/2018 CLINICAL DATA:  Shortness of breath EXAM: CT ANGIOGRAPHY CHEST WITH CONTRAST TECHNIQUE: Multidetector CT imaging of the chest was performed using the standard protocol during bolus administration of intravenous contrast. Multiplanar CT image reconstructions and MIPs were obtained to evaluate the vascular anatomy. CONTRAST:  75mL ISOVUE-370 IOPAMIDOL (ISOVUE-370) INJECTION 76% COMPARISON:  Same day chest radiograph FINDINGS: Cardiovascular: Positive examination for pulmonary embolism with saddle embolus and a large burden of lobar and more distal embolism present throughout. There is near occlusive embolus present in the right intralobar artery and more distally in the lobar and segmental arteries of the bilateral lower lobes. Embolus is present in all lobes although at the segmental level and more distally in the upper  lobes. There is enlargement of the RV LV ratio to approximately 1.5. No pericardial effusion. Scattered coronary artery calcifications. Mediastinum/Nodes: No enlarged mediastinal, hilar, or axillary lymph nodes. Thyroid gland, trachea, and esophagus demonstrate no significant findings. Lungs/Pleura: There is subpleural ground-glass opacity of the left greater than right lung bases, concerning for developing pulmonary infarctions distal to occlusive embolus. No pleural effusion or pneumothorax. Mild underlying emphysema. Upper  Abdomen: No acute abnormality. Musculoskeletal: No chest wall abnormality. No acute or significant osseous findings. Review of the MIP images confirms the above findings. IMPRESSION: 1. Positive examination for pulmonary embolism with saddle embolus and a large burden of lobar and more distal embolism present throughout. There is near occlusive embolus present in the right intralobar artery and more distally in the lobar and segmental arteries of the bilateral lower lobes. Embolus is present in all lobes although at the segmental level and more distally in the upper lobes. 2. There is enlargement of the RV LV ratio to approximately 1.5, concerning for right heart strain. 3. There is subpleural ground-glass opacity of the left greater than right lung bases, concerning for developing pulmonary infarctions distal to occlusive embolus. 4.  Emphysema. 5.  Coronary artery disease. These results were called by telephone at the time of interpretation on 05/03/2018 at 2:23 pm to Dr. Lynden Oxford , who verbally acknowledged these results. Electronically Signed   By: Lauralyn Primes M.D.   On: 04/17/2018 14:23   Dg Chest Portable 1 View  Result Date: 05/09/2018 CLINICAL DATA:  63 year old male with a history of stroke and hypertension EXAM: PORTABLE CHEST 1 VIEW COMPARISON:  04/23/2018 FINDINGS: Cardiomediastinal silhouette unchanged in size and contour. New opacity at the left lung base in the  retrocardiac region with blunting of left costophrenic angle. No pneumothorax. Crowding of the central vasculature. No evidence of interlobular septal thickening. Interval removal of right IJ central venous catheter. Scoliotic curvature. IMPRESSION: New opacity at the left lung base, potentially consolidation or atelectasis. Pleural effusion not excluded. Interval removal of right IJ central catheter. Electronically Signed   By: Gilmer Mor D.O.   On: 04/26/2018 10:21    Procedures Procedures (including critical care time)  CRITICAL CARE Performed by: Canary Brim Verbie Babic Total critical care time: 60 minutes Critical care time was exclusive of separately billable procedures and treating other patients. Critical care was necessary to treat or prevent imminent or life-threatening deterioration. Critical care was time spent personally by me on the following activities: development of treatment plan with patient and/or surrogate as well as nursing, discussions with consultants, evaluation of patient's response to treatment, examination of patient, obtaining history from patient or surrogate, ordering and performing treatments and interventions, ordering and review of laboratory studies, ordering and review of radiographic studies, pulse oximetry and re-evaluation of patient's condition.    Medications Ordered in ED Medications  iopamidol (ISOVUE-370) 76 % injection (has no administration in time range)  ceFEPIme (MAXIPIME) 2 g in sodium chloride 0.9 % 100 mL IVPB (0 g Intravenous Stopped 04/30/2018 1320)  vancomycin (VANCOCIN) IVPB 750 mg/150 ml premix (has no administration in time range)  iopamidol (ISOVUE-370) 76 % injection (has no administration in time range)  sodium chloride 0.9 % bolus 1,000 mL (0 mLs Intravenous Stopped 05/03/2018 1108)  vancomycin (VANCOCIN) 1,500 mg in sodium chloride 0.9 % 500 mL IVPB (0 mg Intravenous Stopped 04/30/2018 1419)  sodium chloride 0.9 % bolus 1,000 mL (0 mLs  Intravenous Stopped 05/06/2018 1509)  iopamidol (ISOVUE-370) 76 % injection 75 mL (75 mLs Intravenous Contrast Given 04/18/2018 1411)     Initial Impression / Assessment and Plan / ED Course  I have reviewed the triage vital signs and the nursing notes.  Pertinent labs & imaging results that were available during my care of the patient were reviewed by me and considered in my medical decision making (see chart for details).        Luis Money  Johnson is a 63 y.o. male with a past medical history significant for hypertension, hyperlipidemia, COPD, and recent hemorrhagic stroke who presents from his rehab facility for respiratory distress.  Patient brought in by EMS she reports that family realize patient was having difficulty breathing and called EMS.  EMS says that his oxygen saturations were in the 60s on arrival.  On nonrebreather it only slightly improved.  On BiPAP it has begun to improve and is now in the 90s on BiPAP.  Patient complained EMS that he was having chest pain although he denies it on arrival.  Patient denies fevers, chills, congestion, or cough.  He denies abdominal pain or or back pain.  He is on BiPAP but is shaking his head yes no to questions.  On exam, lungs have coarseness in all lung fields.  Also some wheezing.  Chest and abdomen nontender.  No murmur.  Patient has decreased grip strength and decreased strength on left leg.  Patient following all commands.  Pupils are symmetric and reactive bilaterally.  Exam limited due to respiratory distress.  Clinically I am concerned about pulmonary embolism given the patient's decreased mobility, recent admission, and new tachycardia, hypoxia, and reported chest discomfort.  He will have a CT PE study to also look for pneumonia given recent admission.  Initial temperature was afebrile however will check rectal temp.  Patient will be given fluids for tachycardia initially.  Patient will have head CT given recent hypertensive hemorrhagic stroke and  change in respiratory status.  Anticipate admission.  10:53 AM Portable chest x-ray shows pneumonia.  White count elevated at 14.4.  Lactic acid elevated.  Given patient's vital signs and these findings, patient made a code sepsis and will be started on broad-spectrum antibiotics for pneumonia.  Patient still awaiting other labs and imaging to rule out PE.  Wife arrived and is able to provide more story.  She reports that he has had cough for the last few days.  Suspect pneumonia.  Patient will be admitted for further management of pneumonia.  2:22 PM I personally reviewed the CT scan to look for pulmonary embolism and a large saddle PE was discovered.  Patient had recent hemorrhagic stroke 2 weeks ago thus, he is not a candidate for heparinization at this time.  We will call critical care for further management recommendations.  Anticipate admission to ICU.  2:51 PM Critical care since came see the patient and they feel that as he is not intubated at this time, vital signs are stable, they feel he is appropriate for a stepdown bed with the family medicine service with them in consultation.  I spoke with interventional radiology who did not feel there was any thrombectomy or lytic treatment appropriate for him given the recent head bleed.  Patient be admitted to family medicine for further management.  Critical care recommended palliative consultation.  Family medicine will discuss with critical care the best place for the patient be admitted however patient will be admitted.  Final Clinical Impressions(s) / ED Diagnoses   Final diagnoses:  HCAP (healthcare-associated pneumonia)  Respiratory distress  Hypoxia  Shortness of breath  Cough  Acute saddle pulmonary embolism with acute cor pulmonale [facility])    ED Discharge Orders    None      Clinical Impression: 1. HCAP (healthcare-associated pneumonia)   2. Respiratory distress   3. Hypoxia   4. Shortness of breath   5. Cough    6. Acute saddle pulmonary embolism with acute cor pulmonale (  HCC)     Disposition: Admit  This note was prepared with assistance of Dragon voice recognition software. Occasional wrong-word or sound-a-like substitutions may have occurred due to the inherent limitations of voice recognition software.     Anessa Charley, Canary Brim, MD 05/08/2018 [phone]

## 2018-05-02 NOTE — ED Notes (Signed)
Critical I-stat troponin results reported to Teagler, MD and Selena Batten, RN via face to face @ 1529  I-stat troponin results: 0.23

## 2018-05-02 NOTE — ED Notes (Signed)
This RN accompanied pt to CT, pt brought back to room, MD notified re: CT chest results

## 2018-05-02 NOTE — Progress Notes (Signed)
Location:    [facility] Room Number: 224/A Place of Service:  SNF [id number]) Provider:  Edmon Crape  Clinic, [facility] Sink  Patient Care Team: Clinic, Lenn Sink as PCP - General  Extended Emergency Contact Information Primary Emergency Contact: [facility] Address: [address]          [place], [place] [address] [place] Home Phone: [phone] Relation: Spouse  Code Status:  Full Code Goals of care: Advanced Directive information Advanced Directives 05/01/2018  Does Patient Have a Medical Advance Directive? Yes  Type of Advance Directive (No Data)  Does patient want to make changes to medical advance directive? No - Patient declined  Copy of Healthcare Power of Attorney in Chart? -  Would patient like information on creating a medical advance directive? No - Patient declined     Chief Complaint  Patient presents with  . Acute Visit    Respiratory distress    HPI:  Pt is a 63 y.o. male seen today for an acute visit for  Respiratory distress.  Patient is a recent admission to facility with a history of an intracerebral hemorrhage  Natremia and dysphasia initially received tube feedings but passed modified barium swallow with speech therapy recommending regular diet with thin liquids.  He also received Zosyn for temperature of up to 102.7.  A did receive Cleviprex in the ICU for hypertensive emergency.  He also had an elevated creatinine of 2.54 but was 0.95 at discharge.  He was admitted here his wife actually works here at [employer].  .  Apparently this morning he developed respiratory distress with increased rate of breathing-labored breathing and significant hypoxia with O2 stats going into the 50s-it was difficult to get his saturation above the 60s even with oxygen supplementation  Patient is able to speak minimally but appears to not be complaining of chest pain but again he is not speaking  much he is alert.        Past Medical History:  Diagnosis Date  . Hypertension   . Stroke [facility])    Past Surgical History:  Procedure Laterality Date  . ARTERY BIOPSY Bilateral 06/03/2017   Procedure: BILATERAL TEMPORAL ARTERY BIOPSY;  Surgeon: Fransisco Hertz, MD;  Location: [facility] OR;  Service: Vascular;  Laterality: Bilateral;  . IR 3D INDEPENDENT WKST  06/02/2017  . IR ANGIO INTRA EXTRACRAN SEL COM CAROTID INNOMINATE BILAT MOD SED  06/02/2017  . IR ANGIO VERTEBRAL SEL VERTEBRAL BILAT MOD SED  06/02/2017  . SHOULDER SURGERY      Allergies  Allergen Reactions  . Eggs Or Egg-Derived Products Anaphylaxis and Swelling    Throat and body swells (can only tolerated boiled eggs)  . Acetic Acid Other (See Comments)    Pt had initial headache and believes it led to brain bleeds and blindness  . 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 Other Other (See Comments)    Headaches lead to blindness oftentimes (starts with eyes fluttering, then progresses to stuttering)    Facility-Administered Encounter Medications as of 04/17/2018  Medication  . ceFEPIme (MAXIPIME) 2 g in sodium chloride 0.9 % 100 mL IVPB  . iopamidol (ISOVUE-370) 76 % injection  . iopamidol (ISOVUE-370) 76 % injection  . sodium chloride 0.9 % bolus 1,000 mL  . [START ON 05/03/2018] vancomycin (VANCOCIN) IVPB 750 mg/150 ml premix   Outpatient Encounter Medications as of 05/03/2018  Medication Sig  . bisacodyl (DULCOLAX) 10 MG suppository Place 10 mg rectally daily as needed for mild constipation or moderate constipation. If not relieved bu MOM 1 dose in 24 hours  . escitalopram (LEXAPRO) 10 MG tablet Take 10 mg by mouth daily.  . feeding supplement, ENSURE ENLIVE, (ENSURE ENLIVE) LIQD Take 237 mLs by mouth 2 (two) times daily between meals.  Marland Kitchen ipratropium-albuterol (DUONEB) 0.5-2.5 (3) MG/3ML SOLN Take 3 mLs by nebulization every 6 (six) hours as needed.  . labetalol (NORMODYNE)  100 MG tablet Take 1 tablet (100 mg total) by mouth 3 (three) times daily.  . magnesium hydroxide (MILK OF MAGNESIA) 400 MG/5ML suspension Take 30 mLs by mouth daily as needed for mild constipation. I dose in 24 hours  . Multiple Vitamin (MULTIVITAMIN WITH MINERALS) TABS tablet Take 1 tablet by mouth daily.  Marland Kitchen OVER THE COUNTER MEDICATION Take by mouth See admin instructions. Magic cup  At 1400 and 2000 is a 4oz frozen cup that provides 290 calories and 9 grams of protein in four great flavors, and a No Sugar Added variety with 260 calories and 9 grams of protein. As an added benefit, it can be eaten frozen as an ice cream or after it thaws, a pudding - perfect for texture modified diets.  . pantoprazole sodium (PROTONIX) 40 mg/20 mL PACK Take 20 mLs (40 mg total) by mouth daily.  . pravastatin (PRAVACHOL) 80 MG tablet Take 80 mg by mouth daily. For  . QUEtiapine (SEROQUEL) 25 MG tablet Take 12.5 mg by mouth at bedtime. For depression   . senna-docusate (SENOKOT-S) 8.6-50 MG tablet Take 1 tablet by mouth 2 (two) times daily. For constipation  . Sodium Phosphates (ENEMA RE) Place 1 application rectally as needed. Saline 1 dose 24 hours  . verapamil (CALAN) 40 MG tablet Take 40 mg by mouth every 8 (eight) hours. For Hypertension    Review of Systems   This is very limited secondary to patient being in distress please see HPI   There is no immunization history on file for this patient. Pertinent  Health Maintenance Due  Topic Date Due  . INFLUENZA VACCINE  05/30/2018 (Originally 10/13/2017)  . COLONOSCOPY  05/30/2018 (Originally 02/10/2004)   Fall Risk  12/23/2017  Falls in the past year? No   Functional Status Survey:    Vitals:   04/17/2018 1403  BP: 110/60  Pulse: (!) 130  Resp: (!) 28  SpO2: (!) 58%  Height: 5\' 4"  (1.626 m)   Body mass index is 25.85 kg/m. Physical Exam  In general this is a frail appearing elderly male he is responsive but appears to be uncomfortable working   hard to breathe with increased respiratory rate and effort.  Skin is warm and dry is not diaphoretic.  Chest he does have diffuse coarse breath sounds and labored breathing  Heart is tachycardic I got rate between 120 and 135.  Abdomen is soft does not appear to be acutely tender although this is difficult to fully assess because of patient distress.  Musculoskeletal appears very weak apparently does have significant left-sided weakness   Neurologic he is alert does try to speak some but is working so hard to breathe is difficult to speak.  Appear anxious and somewhat distressed  Labs reviewed: Recent Labs    04/18/18 1700  04/19/18 0557  04/23/18 0401  04/26/18 0517 04/27/18 0559 04/25/2018 0957  NA  --    < > 159*   < > 153*   < >  142 142 137  K  --   --  3.0*   < > 4.0   < > 4.2 4.5 4.9  CL  --   --  124*   < > 116*   < > 111 108 103  CO2  --   --  25   < > 23   < > 25 22 18*  GLUCOSE  --   --  142*   < > 172*   < > 137* 121* 188*  BUN  --   --  13   < > 63*   < > 20 19 58*  CREATININE  --   --  0.83   < > 2.05*   < > 1.02 0.95 1.91*  CALCIUM  --   --  9.2   < > 9.5   < > 8.9 9.4 10.1  MG 2.8*  --  2.2  --  2.7*  --   --   --   --   PHOS 2.1*  --  3.1  --  3.9  --   --   --   --    < > = values in this interval not displayed.   Recent Labs    12/21/17 1644 04/16/18 0739 05/03/2018 0957  AST 36 27 58*  ALT 22 14 129*  ALKPHOS 58 44 129*  BILITOT 1.3* 1.4* 0.9  PROT 7.5 8.6* 7.7  ALBUMIN 3.8 4.1 2.6*   Recent Labs    04/26/18 0517 04/27/18 0559 05/03/2018 0957  WBC 13.9* 12.9* 14.4*  NEUTROABS 11.2* 10.3* 13.3*  HGB 12.0* 11.8* 13.5  HCT 37.7* 36.4* 42.5  MCV 83.8 82.7 83.7  PLT 216 295 382   Lab Results  Component Value Date   TSH 0.773 05/31/2017   Lab Results  Component Value Date   HGBA1C 6.1 (H) 04/22/2018   Lab Results  Component Value Date   CHOL 217 (H) 04/22/2018   HDL 33 (L) 04/22/2018   LDLCALC 154 (H) 04/22/2018   TRIG 154 (H)  04/27/2018   CHOLHDL 6.6 04/22/2018    Significant Diagnostic Results in last 30 days:  Ct Head Wo Contrast  Result Date: 04/24/2018 CLINICAL DATA:  63 y/o  M; intracranial hemorrhage for follow-up. EXAM: CT HEAD WITHOUT CONTRAST TECHNIQUE: Contiguous axial images were obtained from the base of the skull through the vertex without intravenous contrast. COMPARISON:  04/19/2018 CT head. FINDINGS: Brain: Interval partial dispersion hemorrhage within the right occipital temporal lobe with decreased attenuation. Stable volume of distribution, surrounding edema, and associated mass effect. Partial effacement of the right lateral ventricle and 5 mm right-to-left midline shift is stable. Stable small focus of encephalomalacia within the left occipital lobe. Stable nonspecific white matter hypodensities compatible with chronic microvascular ischemic changes and stable volume loss of the brain. No new acute intracranial abnormality identified. Vascular: Calcific atherosclerosis of the carotid siphons and vertebral arteries. No hyperdense vessel identified. Skull: Normal. Negative for fracture or focal lesion. Sinuses/Orbits: Right maxillary sinus internal bony septation with fluid level of the occluded cell, similar to prior study. Additional visible paranasal sinuses are normally aerated. Normal aeration of mastoid air cells. Orbits are unremarkable. Other: None. IMPRESSION: 1. Interval partial dispersion hemorrhage within the right occipital temporal lobe with decreased attenuation. Stable volume of distribution, surrounding edema, and 5 mm right-to-left midline shift. 2. No new intracranial abnormality identified. Electronically Signed   By: Mitzi HansenLance  Furusawa-Stratton M.D.   On: 04/24/2018 06:02   Ct Head Wo  Contrast  Result Date: 04/19/2018 CLINICAL DATA:  Follow-up examination for intracranial hemorrhage EXAM: CT HEAD WITHOUT CONTRAST TECHNIQUE: Contiguous axial images were obtained from the base of the skull  through the vertex without intravenous contrast. COMPARISON:  Prior CT from 04/16/2018 FINDINGS: Brain: Intraparenchymal hemorrhage centered at the right parieto-occipital region is slightly decreased in size now measuring 4.8 x 5.4 x 7.4 cm (estimated volume 96 cc, previously 123 cc). Surrounding vasogenic edema slightly improved. Persistent but improved right-to-left midline shift now measuring up to 5 mm, previously 7 mm. Improved effacement of the right lateral ventricle. No hydrocephalus or ventricular trapping. Basilar cisterns remain patent. No intraventricular extension of hemorrhage. Trace extra-axial hemorrhage overlying the right cerebral convexity grossly similar to previous MRI. Otherwise, appearance of the brain is stable. No interval large vessel territory infarct or other acute finding. Vascular: No hyperdense vessel. Calcified atherosclerosis at the skull base. Skull: Scalp soft tissues and calvarium demonstrate no acute finding. Sinuses/Orbits: Globes and orbital soft tissues within normal limits. Paranasal sinuses largely clear. No mastoid effusion. Other: None. IMPRESSION: 1. Interval decrease in size of right temporal occipital intraparenchymal hemorrhage, estimated volume 96 cc on today's exam, previously 123 cc on 04/16/2018. 2. Slightly improved associated edema and regional mass effect. Right-to-left midline shift now measures 5 mm, previously 7 mm. 3. No other new acute intracranial abnormality. Electronically Signed   By: Rise MuBenjamin  McClintock M.D.   On: 04/19/2018 04:25   Ct Head Wo Contrast  Result Date: 04/16/2018 CLINICAL DATA:  63 year old male with acute right hemisphere hemorrhage diagnosed on CT today following presentation with generalized weakness and confusion. History of abnormal brain MRI suspicious for an amyloid angiopathy. EXAM: CT HEAD WITHOUT CONTRAST TECHNIQUE: Contiguous axial images were obtained from the base of the skull through the vertex without intravenous  contrast. COMPARISON:  Head CT 0721 hours today and earlier. FINDINGS: Brain: Large area of intra-axial hemorrhage in the posterior right hemisphere with layering hematocrit levels encompasses an area of 88 x 50 x 56 millimeters for an estimated intra-axial blood volume of 123 milliliters. Size and configuration is stable from 0721 hours today. Surrounding edema and regional mass effect are stable with leftward midline shift of 7 millimeters and subtotal effacement of the right lateral ventricle. No intraventricular or extra-axial extension of blood is identified. Partial effacement of the ambient cisterns is stable. The remaining basilar cisterns are patent. Stable gray-white matter differentiation elsewhere. No definite acute ventricular enlargement. Vascular: Calcified atherosclerosis at the skull base. Skull: No acute osseous abnormality identified. Sinuses/Orbits: Stable and well pneumatized. Other: The patient is now intubated. No acute orbit or scalp soft tissue finding. IMPRESSION: 1. Stable large posterior right hemisphere intra-axial hemorrhage since 0721 hours today, which might be the sequelae of an amyloid angiopathy. Estimated blood volume 123 mL, with no intraventricular or extra-axial extension. 2. Stable associated mass effect with leftward midline shift of 7 mm and subtotal effacement of the right lateral ventricle. 3. No new intracranial abnormality. Electronically Signed   By: Odessa FlemingH  Hall M.D.   On: 04/16/2018 14:39   Mr Maxine GlennMra Head Wo Contrast  Result Date: 04/18/2018 CLINICAL DATA:  Headache, follow-up hemorrhage. History of hypertension, amyloid angiopathy and intracranial hemorrhage. EXAM: MRI HEAD WITHOUT CONTRAST MRA HEAD WITHOUT CONTRAST TECHNIQUE: Multiplanar, multiecho pulse sequences of the brain and surrounding structures were obtained without intravenous contrast. Angiographic images of the head were obtained using MRA technique without contrast. COMPARISON:  CT HEAD April 16, 2018 and  MRI head December 21, 2017  and MRA head May 31, 2017. FINDINGS: MRI HEAD FINDINGS INTRACRANIAL CONTENTS: Spurs reduced diffusion with susceptibility artifact corresponding to known RIGHT temporal occipital intraparenchymal hematoma, patchy T1 shortening. Surrounding vasogenic edema with 5 mm RIGHT to LEFT midline shift. Partially effaced RIGHT lateral ventricle without LEFT ventricular entrapment. Ex vacuo dilatation LEFT occipital horn. Old LEFT occipital lobe hemorrhage and encephalomalacia. 1 cm reduced diffusion and low ADC values RIGHT frontal lobe. Extensive supra and infratentorial chronic microhemorrhages with posterior predominance. Patchy supratentorial white matter FLAIR T2 hyperintensities. Prominent perivascular spaces with juxta cortical T2 hyperintense signal, unchanged. VASCULAR: Normal major intracranial vascular flow voids present at skull base. SKULL AND UPPER CERVICAL SPINE: No abnormal sellar expansion. No suspicious calvarial bone marrow signal. Craniocervical junction maintained. SINUSES/ORBITS: The mastoid air-cells and included paranasal sinuses are well-aerated.The included ocular globes and orbital contents are non-suspicious. OTHER: Life support lines in place. MRA HEAD FINDINGS ANTERIOR CIRCULATION: Normal flow related enhancement of the included cervical, petrous, cavernous and supraclinoid internal carotid arteries. Patent anterior communicating artery. Patent anterior and middle cerebral arteries. Mild stenosis RIGHT M 1 origin. 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 No large vessel occlusion, flow limiting stenosis. POSTERIOR CIRCULATION: Codominant vertebral arteries. Vertebrobasilar arteries are patent, with normal flow related enhancement of the main branch vessels. Patent posterior cerebral arteries. New mild stenosis LEFT P 2 segment. No large vessel occlusion, flow limiting stenosis,  aneurysm. ANATOMIC VARIANTS: None. Source images and MIP images were reviewed.  IMPRESSION: MRI HEAD: 1. Acute to subacute large RIGHT temporal occipital hematoma with 5 mm RIGHT-to-LEFT midline shift. No ventricular entrapment. 2. Extensive chronic microhemorrhages in advanced chronic small vessel ischemic changes seen with amyloid angiopathy/angitis. 3. Old LEFT occipital hemorrhage. MRA HEAD: 1. No emergent large vessel occlusion or flow-limiting stenosis. Mild stenosis RIGHT M1 and LEFT P2 segment. 2. 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 Electronically Signed   By: Awilda Metro M.D.   On: 04/18/2018 02:38   Mr Brain Wo Contrast  Result Date: 04/18/2018 CLINICAL DATA:  Headache, follow-up hemorrhage. History of hypertension, amyloid angiopathy and intracranial hemorrhage. EXAM: MRI HEAD WITHOUT CONTRAST MRA HEAD WITHOUT CONTRAST TECHNIQUE: Multiplanar, multiecho pulse sequences of the brain and surrounding structures were obtained without intravenous contrast. Angiographic images of the head were obtained using MRA technique without contrast. COMPARISON:  CT HEAD April 16, 2018 and MRI head December 21, 2017 and MRA head May 31, 2017. FINDINGS: MRI HEAD FINDINGS INTRACRANIAL CONTENTS: Spurs reduced diffusion with susceptibility artifact corresponding to known RIGHT temporal occipital intraparenchymal hematoma, patchy T1 shortening. Surrounding vasogenic edema with 5 mm RIGHT to LEFT midline shift. Partially effaced RIGHT lateral ventricle without LEFT ventricular entrapment. Ex vacuo dilatation LEFT occipital horn. Old LEFT occipital lobe hemorrhage and encephalomalacia. 1 cm reduced diffusion and low ADC values RIGHT frontal lobe. Extensive supra and infratentorial chronic microhemorrhages with posterior predominance. Patchy supratentorial white matter FLAIR T2 hyperintensities. Prominent perivascular spaces with juxta cortical T2 hyperintense signal, unchanged. VASCULAR: Normal major intracranial vascular flow voids present at skull base. SKULL AND UPPER CERVICAL  SPINE: No abnormal sellar expansion. No suspicious calvarial bone marrow signal. Craniocervical junction maintained. SINUSES/ORBITS: The mastoid air-cells and included paranasal sinuses are well-aerated.The included ocular globes and orbital contents are non-suspicious. OTHER: Life support lines in place. MRA HEAD FINDINGS ANTERIOR CIRCULATION: Normal flow related enhancement of the included cervical, petrous, cavernous and supraclinoid internal carotid arteries. Patent anterior communicating artery. Patent anterior and middle cerebral arteries. Mild stenosis RIGHT M 1 origin. Stable 3  mm RIGHT MCA bifurcation aneurysm. No large vessel occlusion, flow limiting stenosis. POSTERIOR CIRCULATION: Codominant vertebral arteries. Vertebrobasilar arteries are patent, with normal flow related enhancement of the main branch vessels. Patent posterior cerebral arteries. New mild stenosis LEFT P 2 segment. No large vessel occlusion, flow limiting stenosis,  aneurysm. ANATOMIC VARIANTS: None. Source images and MIP images were reviewed. IMPRESSION: MRI HEAD: 1. Acute to subacute large RIGHT temporal occipital hematoma with 5 mm RIGHT-to-LEFT midline shift. No ventricular entrapment. 2. Extensive chronic microhemorrhages in advanced chronic small vessel ischemic changes seen with amyloid angiopathy/angitis. 3. Old LEFT occipital hemorrhage. MRA HEAD: 1. No emergent large vessel occlusion or flow-limiting stenosis. Mild stenosis RIGHT M1 and LEFT P2 segment. 2. 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 Electronically Signed   By: Awilda Metro M.D.   On: 04/18/2018 02:38   Dg Chest Portable 1 View  Result Date: 04/23/2018 CLINICAL DATA:  63 year old male with a history of stroke and hypertension EXAM: PORTABLE CHEST 1 VIEW COMPARISON:  04/23/2018 FINDINGS: Cardiomediastinal silhouette unchanged in size and contour. New opacity at the left lung base in the retrocardiac region with blunting of left costophrenic angle.  No pneumothorax. Crowding of the central vasculature. No evidence of interlobular septal thickening. Interval removal of right IJ central venous catheter. Scoliotic curvature. IMPRESSION: New opacity at the left lung base, potentially consolidation or atelectasis. Pleural effusion not excluded. Interval removal of right IJ central catheter. Electronically Signed   By: Gilmer Mor D.O.   On: 05/05/2018 10:21   Dg Chest Port 1 View  Result Date: 04/23/2018 CLINICAL DATA:  CHF EXAM: PORTABLE CHEST 1 VIEW COMPARISON:  Yesterday FINDINGS: Feeding tube is in stable position below the diaphragm. Right IJ line with tip at the SVC. Generalized interstitial coarsening, unchanged. Borderline heart size accentuated by technique. IMPRESSION: Stable hardware positioning and interstitial opacity. Electronically Signed   By: Marnee Spring M.D.   On: 04/23/2018 11:07   Dg Chest Port 1 View  Result Date: 04/22/2018 CLINICAL DATA:  Fever EXAM: PORTABLE CHEST 1 VIEW COMPARISON:  Four days ago FINDINGS: Interval extubation and replacement of enteric tube. Right IJ line with tip at the SVC. Symmetric interstitial coarsening. No air bronchogram. Stable heart size. No effusion or pneumothorax. IMPRESSION: Interstitial coarsening with symmetry favoring vascular congestion. Aspiration is also considered given the history of fever. Electronically Signed   By: Marnee Spring M.D.   On: 04/22/2018 08:45   Portable Chest Xray  Result Date: 04/18/2018 CLINICAL DATA:  Respiratory failure EXAM: PORTABLE CHEST 1 VIEW COMPARISON:  04/16/2018 FINDINGS: Cardiac shadow is stable. Endotracheal tube, right jugular central line and gastric catheter are noted in satisfactory position. Lungs are well aerated bilaterally. No focal infiltrate or sizable effusion is seen. Left shoulder replacement is noted. IMPRESSION: Tubes and lines as described. No acute abnormality noted. Electronically Signed   By: Alcide Clever M.D.   On: 04/18/2018 08:22    Dg Chest Portable 1 View  Result Date: 04/16/2018 CLINICAL DATA:  ETT and OG tube placement EXAM: PORTABLE CHEST 1 VIEW COMPARISON:  04/16/2018 at 0839 hours FINDINGS: Endotracheal tube terminates 3 cm above the carina. Mild left basilar opacity, likely atelectasis. No frank interstitial edema. No pleural effusion or pneumothorax. The heart is normal in size. Right IJ venous catheter terminates at the cavoatrial junction. Enteric tube courses into the stomach. IMPRESSION: Endotracheal tube terminates 3 cm above the carina. Additional support apparatus as above. Electronically Signed   By: Roselie Awkward.D.  On: 04/16/2018 09:56   Dg Chest Portable 1 View  Result Date: 04/16/2018 CLINICAL DATA:  Central line placement EXAM: PORTABLE CHEST 1 VIEW COMPARISON:  02/08/2016 FINDINGS: Right jugular central venous catheter is in place with its tip at the cavoatrial junction. No pneumothorax. Normal heart size. Low lung volumes with bibasilar hypoaeration. Normal vascularity. No pleural effusion. IMPRESSION: Right jugular central venous catheter with its tip at the cavoatrial junction and no pneumothorax Low lung volumes. Electronically Signed   By: Jolaine Click M.D.   On: 04/16/2018 09:00   Dg Swallowing Func-speech Pathology  Result Date: 04/24/2018 Objective Swallowing Evaluation: Type of Study: MBS-Modified Barium Swallow Study  Patient Details Name: Luis Johnson MRN: [id number] Date of Birth: 1955-06-10 Today's Date: 04/24/2018 Time: SLP Start Time (ACUTE ONLY): 1015 -SLP Stop Time (ACUTE ONLY): 1040 SLP Time Calculation (min) (ACUTE ONLY): 25 min Past Medical History: Past Medical History: Diagnosis Date . Hypertension  . Stroke [facility])  Past Surgical History: Past Surgical History: Procedure Laterality Date . ARTERY BIOPSY Bilateral 06/03/2017  Procedure: BILATERAL TEMPORAL ARTERY BIOPSY;  Surgeon: Fransisco Hertz, MD;  Location: [facility];  Service: Vascular;  Laterality: Bilateral; . IR 3D INDEPENDENT WKST   06/02/2017 . IR ANGIO INTRA EXTRACRAN SEL COM CAROTID INNOMINATE BILAT MOD SED  06/02/2017 . IR ANGIO VERTEBRAL SEL VERTEBRAL BILAT MOD SED  06/02/2017 . SHOULDER SURGERY   HPI: 63 yo admitted with decreased responsiveness from home with large parenchymal intracerebral hemorrhage in the right parietotemporal area. INtubated for airway protection 2/2-2/7. PMhx: HTN, ICH, CVA, amyloid beta related angiitis  Subjective: pt confused but conversant Assessment / Plan / Recommendation CHL IP CLINICAL IMPRESSIONS 04/24/2018 Clinical Impression Pt presents with functional oropharyngeal abilities and no observed aspiration with nectar thick liquids and thin liquids. Pt is very impulsive and his cognitive deficits prevent him from following directions related to rate or bolus size. Therefore, after initial trials established safety with consumption, pt allowed to impulsively control rate and bolus under fluro with no observed aspiration.  Pt appropriate for regular diet with thin liquids (straw allowed), medicine whole wih thin liquids. Nursing assistance for self-feeding to visual and cognitive deficits.  SLP Visit Diagnosis Cognitive communication deficit (R41.841);Dysphagia, oropharyngeal phase (R13.12) Attention and concentration deficit following -- Frontal lobe and executive function deficit following -- Impact on safety and function No limitations   [facility] IP TREATMENT RECOMMENDATION 04/24/2018 Treatment Recommendations Therapy as outlined in treatment plan below   Prognosis 04/24/2018 Prognosis for Safe Diet Advancement Good Barriers to Reach Goals Severity of deficits;Cognitive deficits Barriers/Prognosis Comment -- [facility] IP DIET RECOMMENDATION 04/24/2018 SLP Diet Recommendations Regular solids;Thin liquid Liquid Administration via Cup;Straw Medication Administration Whole meds with liquid Compensations Minimize environmental distractions;Slow rate;Small sips/bites Postural Changes Seated upright at 90 degrees   [facility] IP OTHER  RECOMMENDATIONS 04/24/2018 Recommended Consults -- Oral Care Recommendations Oral care BID Other Recommendations --   [facility] IP FOLLOW UP RECOMMENDATIONS 04/24/2018 Follow up Recommendations Inpatient Rehab   [facility] IP FREQUENCY AND DURATION 04/24/2018 Speech Therapy Frequency (ACUTE ONLY) min 2x/week Treatment Duration 2 weeks      [facility]L IP ORAL PHASE 04/24/2018 Oral Phase WFL Oral - Pudding Teaspoon -- Oral - Pudding Cup -- Oral - Honey Teaspoon -- Oral - Honey Cup -- Oral - Nectar Teaspoon -- Oral - Nectar Cup -- Oral - Nectar Straw -- Oral - Thin Teaspoon -- Oral - Thin Cup -- Oral - Thin Straw -- Oral - Puree -- Oral - Mech Soft -- Oral -  Regular -- Oral - Multi-Consistency -- Oral - Pill -- Oral Phase - Comment --  [facility]L IP PHARYNGEAL PHASE 04/24/2018 Pharyngeal Phase WFL Pharyngeal- Pudding Teaspoon -- Pharyngeal -- Pharyngeal- Pudding Cup -- Pharyngeal -- Pharyngeal- Honey Teaspoon -- Pharyngeal -- Pharyngeal- Honey Cup -- Pharyngeal -- Pharyngeal- Nectar Teaspoon -- Pharyngeal -- Pharyngeal- Nectar Cup -- Pharyngeal -- Pharyngeal- Nectar Straw -- Pharyngeal -- Pharyngeal- Thin Teaspoon -- Pharyngeal -- Pharyngeal- Thin Cup -- Pharyngeal -- Pharyngeal- Thin Straw -- Pharyngeal -- Pharyngeal- Puree -- Pharyngeal -- Pharyngeal- Mechanical Soft -- Pharyngeal -- Pharyngeal- Regular -- Pharyngeal -- Pharyngeal- Multi-consistency -- Pharyngeal -- Pharyngeal- Pill -- Pharyngeal -- Pharyngeal Comment --  [facility]L IP CERVICAL ESOPHAGEAL PHASE 04/24/2018 Cervical Esophageal Phase WFL Pudding Teaspoon -- Pudding Cup -- Honey Teaspoon -- Honey Cup -- Nectar Teaspoon -- Nectar Cup -- Nectar Straw -- Thin Teaspoon -- Thin Cup -- Thin Straw -- Puree -- Mechanical Soft -- Regular -- Multi-consistency -- Pill -- Cervical Esophageal Comment -- Happi Overton 04/24/2018, 11:08 AM              Ct Head Code Stroke Wo Contrast  Result Date: 04/16/2018 CLINICAL DATA:  Code stroke. Confusion with generalized weakness. Suspected cerebral amyloid  angiopathy. EXAM: CT HEAD WITHOUT CONTRAST TECHNIQUE: Contiguous axial images were obtained from the base of the skull through the vertex without intravenous contrast. COMPARISON:  Multiple prior CT and MR imaging studies. FINDINGS: Brain: Acute RIGHT posterior temporal, occipital, and parietal hemorrhage, dissecting throughout the cortex and subcortical white matter, early midline shift. Cross-sectional measurements of 52 x 82 x 53 mm corresponds to an approximate volume of 112 mL, larger than the previous hemorrhages. Early uncal herniation. Slight brainstem rotation. Midline shift at the septum pellucidum level RIGHT-to-LEFT of 8 mm. Encephalomalacia LEFT occipital lobe from prior hemorrhage. No hydrocephalus. Vascular: No hyperdense vessel or unexpected calcification. Skull: Normal. Negative for fracture or focal lesion. Sinuses/Orbits: No acute finding. Other: None. ASPECTS [facility] Stroke Program Early CT Score) Not applicable. IMPRESSION: 1. Large acute RIGHT hemisphere multilobar intracerebral hemorrhage, consistent with complication of cerebral amyloid angiopathy. Approximate volume 112 mL. Significant RIGHT-to-LEFT shift of 8 mm with early uncal herniation. 2. ASPECTS is not applicable. Critical Value/emergent results were called by telephone at the time of interpretation on 04/16/2018 at 7:25 am to Dr. Wilford Corner, who verbally acknowledged these results. Electronically Signed   By: Elsie Stain M.D.   On: 04/16/2018 07:42    Assessment/Plan  #1 history of hypoxia and tachycardia and what appears to be respiratory distress  Patient appears to be in distress EMS was called.  They arrived shortly thereafter during the interim patient's oxygen saturations varied from the high 50s to the low 60s despite  oxygen supplementation.  He remained tachycardic increased respiratory rate.  At this point would suspect possibly a pulmonary process- pneumonia versus pulmonary embolism.  Again he is not  anticoagulated because of the history of recent ICH.  Again he will be going to the ER for expedient evaluation and treatment  [phone]

## 2018-05-02 NOTE — ED Notes (Signed)
Pt rectal temp. 99.7. Notified [place], Charity fundraiser.

## 2018-05-02 NOTE — ED Triage Notes (Signed)
Pt here from [facility] with resp distress arrived on cpap sats 61 % on Room air , 82% on cpap, pt has had a stroke this month

## 2018-05-02 NOTE — Progress Notes (Signed)
FPTS Interim Progress Note  S: Called by RN who reported patient taking off leads and lines.  O: BP 110/75 (BP Location: Right Arm)   Pulse (!) 108   Temp 98 F (36.7 C) (Oral)   Resp (!) 21   Ht 5\' 4"  (1.626 m)   Wt 75.1 kg   SpO2 98%   BMI 28.42 kg/m   Bipap in place.  Right arm with full ROM.  Alert, oriented to self and year. Appears anxious.   A/P: Attempted to re-orient patient with location and situation and why it is important to keep the bipap machine on at this time. He remembers the events of his stroke, but thought he was at [facility]. He did not remember being transferred back to [facility] and did not know why. He asked to let his wife know that he was here. Provided patient with reassurance that wife is aware of his location and status. Patient reports that he is not in any pain at this time.  Pt received 2mg  morphine at 1854 and 1911.  Will provide 2mg  morphine q3 hours PRN for restlessness.   Melene Plan, MD 04/20/2018, 10:27 PM PGY-1, [facility] Family Medicine Service pager [phone]

## 2018-05-02 NOTE — Plan of Care (Signed)
  Problem: Clinical Measurements: Goal: Will remain free from infection Outcome: Progressing Goal: Diagnostic test results will improve Outcome: Progressing Goal: Respiratory complications will improve Outcome: Progressing   Problem: Education: Goal: Knowledge of General Education information will improve Description Including pain rating scale, medication(s)/side effects and non-pharmacologic comfort measures Outcome: Not Progressing   Problem: Health Behavior/Discharge Planning: Goal: Ability to manage health-related needs will improve Outcome: Not Progressing   Problem: Clinical Measurements: Goal: Ability to maintain clinical measurements within normal limits will improve Outcome: Not Progressing

## 2018-05-02 NOTE — Progress Notes (Signed)
MD on call asked that Resp. Attempt weaning patient off. Patient did not tolerate it well. Patient became restless, distress and oxygen dropped in to low 80's in a minute. On call provider notified.

## 2018-05-02 NOTE — ED Notes (Signed)
[place], MD notified re: lactic acid 3.0, face to face communication

## 2018-05-02 NOTE — Progress Notes (Addendum)
Pharmacy Antibiotic Note  Luis Johnson is a 63 y.o. male admitted on 04/21/2018 with pneumonia.  Pharmacy has been consulted for cefepime and vancomycin dosing.  SCr elevated at 1.91 this admit.  Plan: Give vancomycin 1,500mg  IV x 1, then start vancomycin 750mg  IV Q24h Start cefepime 2g IV Q24h Monitor clinical picture, renal function, vanc levels prn F/U C&S, abx deescalation / LOT    Temp (24hrs), Avg:98 F (36.7 C), Min:97.8 F (36.6 C), Max:98.3 F (36.8 C)  Recent Labs  Lab 04/26/18 0517 04/27/18 0559 05/03/2018 0941 04/27/2018 0957  WBC 13.9* 12.9*  --  14.4*  CREATININE 1.02 0.95  --   --   LATICACIDVEN  --   --  3.0*  --     Estimated Creatinine Clearance: 65.8 mL/min (by C-G formula based on SCr of 0.95 mg/dL).    Allergies  Allergen Reactions  . Eggs Or Egg-Derived Products Anaphylaxis and Swelling    Throat and body swells (can only tolerated boiled eggs)  . Acetic Acid Other (See Comments)    Pt had initial headache and believes it led to brain bleeds and blindness  . 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 Other Other (See Comments)    Headaches lead to blindness oftentimes (starts with eyes fluttering, then progresses to stuttering)    Thank you for allowing pharmacy to be a part of this patient's care.  Armandina Stammer 04/15/2018 10:38 AM

## 2018-05-02 NOTE — Progress Notes (Signed)
FPTS Interim Progress Note  S:Went to see patient with Dr. Sydnee Cabal and Dr. Selena Batten to obtain baseline for patient status. Patient seemed very anxious. On Bipap currently. Multiple family members at bedside, wife will stay the night.   O: BP (!) 142/91 (BP Location: Right Arm)   Pulse (!) 113   Temp 98 F (36.7 C) (Axillary)   Resp (!) 27   Ht 5\' 4"  (1.626 m)   Wt 75.1 kg   SpO2 98%   BMI 28.42 kg/m     A/P: [facility] Patient with desaturations requiring bipap. Unable to receive anticoagulation as patient has recent hemorrhagic stroke. Will give trial off bipap to see if patient able to tolerate [place] as patient has been on bipap for several hours, concern for patient tiring out. Respiratory and RN made aware of plan. If patient desats will need to be placed back on bipap.   Will avoid benzos or other medications that could decrease respiratory drive.   CCM made aware of patient on admit. Patient made DNR on admission.   Oralia Manis, DO 2018/05/28, 7:58 PM PGY-2, [facility] Family Medicine Service pager [phone]

## 2018-05-02 NOTE — Progress Notes (Signed)
Patient transported to CT and back to ED room 015.

## 2018-05-02 NOTE — Patient Instructions (Signed)
See assessment and plan under each diagnosis in the problem list and acutely for this visit 

## 2018-05-02 NOTE — H&P (Signed)
Family Medicine Teaching [facility] Admission History and Physical Service Pager: [phone]  Patient name: Luis Johnson Medical record number: [id number] Date of birth: Apr 04, 1955 Age: 63 y.o. Gender: male  Primary Care Provider: [facility] Consultants: Palliative Code Status: DNR/DNI  Chief Complaint: Shortness of breath  Assessment and Plan: Tanya Running is a 63 y.o. male presenting with shortness of breath. PMH is significant for recent intracranial hemorrhagic stroke, hypertension, depression, COPD, hyperlipidemia, and respiratory failure  Shortness of breath secondary to PE: The patient's family was visiting him earlier this afternoon and noticed he was short of breath. Oxygen saturation at the time showed 61% on room air.  He was brought into the emergency department by EMS and hooked up to BiPAP where his oxygen saturations improved to 94%.  Blood pressures were within normal limits, patient was tachypneic between 98 and 130.  With lactic acid elevated at 3.0, chest x-ray showing left lung base opacity, and history of recent hospitalization, suspicion was high for pneumonia and patient was given vancomycin in the ED. patient is afebrile at 98.3 F on admission, but white blood cell count 14.4.  The patient had had a recent intracerebral hemorrhage and was discharged to rehabilitation at [facility] on 2/13 and has been relatively immobile since. CTA was performed and revealed pulmonary embolism with saddle embolus, embolus present in all lobes, and subpleural groundglass opacity of the left lung base concerning for developing pulmonary infarction.  Left lower extremity noted to be warm to the touch and measuring 3 cm greater in size than right lower extremity, suspicious for source of emboli. Troponin noted to be 0.23 on admission, most likely due to demand ischemia.  EKG showing sinus tachycardia without sign of STEMI. -Admit to med surge, attending Dr. McDiarmid -Palliative  care consult - establish goals of care -Cefepime for HCAP coverage -Patient n.p.o. -Cardiac monitoring, continuous pulse ox -IV maintenance fluids -Dulcolax suppository PRN severe constipation  AKI: Cr on admission noted to be 1.91, baseline around 0.95. -Do not plan to monitor -mIVFs ______________________________________________________________  Plan for management of the following chronic, stable conditions: -Hold meds while on BiPAP -We will follow-up after discussion with palliative care  HTN: BP 124/91, patient takes labetalol 100 mg 3 times daily and verapamil 40 mg every 8 hours at home  HLD: Patient takes pravastatin 80 mg daily  Depression: Patient takes Lexapro 10 mg daily and Seroquel 12.5 mg at bedtime  GERD: Patient takes 40 mg Protonix by mouth daily  FEN/GI: mIVFs, NPO Prophylaxis: None  Disposition: Admit to progressive, attending Dr. McDiarmid  History of Present Illness:  Luis Johnson is a 63 y.o. male presenting with shortness of breath. The patient's family was visiting him earlier this afternoon at [facility] when they noticed he was short of breath. He was found to be satting 61% on room air at [facility] his family called EMS. The patient had recently been hospitalized for intracerebral hemorrhage and discharged to rehabilitation at [facility] on 04/27/2018.  Review Of Systems: Per HPI with the following additions: None  Patient Active Problem List   Diagnosis Date Noted  . History of hemorrhagic stroke  (HCC) 04/26/2018  . Cytotoxic cerebral edema (HCC) 04/26/2018  . Hypernatremia, induced 04/26/2018  . Hypertensive emergency 04/26/2018  . Family history of stroke 04/26/2018  . Agitation 04/26/2018  . Malnutrition of moderate degree 04/18/2018  . Respiratory failure (HCC)   . Generalized headaches 06/29/2017  . History of temporal artery biopsy 06/29/2017  . Vision loss 06/01/2017  .  Depression 05/31/2017  . HLD (hyperlipidemia) 05/31/2017  .  Prostatitis, acute 02/09/2016  . HTN (hypertension) 02/09/2016  . COPD (chronic obstructive pulmonary disease) (HCC) 02/09/2016  . AKI (acute kidney injury) (HCC) 02/09/2016  . Transaminitis 02/09/2016  . Sepsis (HCC)   . UTI (urinary tract infection) 02/08/2016  . Cerebral amyloid angiopathy (HCC) 11/27/2014  . 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 ICH (intracerebral hemorrhage) (HCC) 11/11/2014   Past Medical History: Past Medical History:  Diagnosis Date  . Hypertension   . Stroke [facility])    Past Surgical History: Past Surgical History:  Procedure Laterality Date  . ARTERY BIOPSY Bilateral 06/03/2017   Procedure: BILATERAL TEMPORAL ARTERY BIOPSY;  Surgeon: Fransisco Hertz, MD;  Location: [facility] OR;  Service: Vascular;  Laterality: Bilateral;  . IR 3D INDEPENDENT WKST  06/02/2017  . IR ANGIO INTRA EXTRACRAN SEL COM CAROTID INNOMINATE BILAT MOD SED  06/02/2017  . IR ANGIO VERTEBRAL SEL VERTEBRAL BILAT MOD SED  06/02/2017  . SHOULDER SURGERY     Social History: Social History   Tobacco Use  . Smoking status: Former Smoker    Last attempt to quit: 01/21/1994    Years since quitting: 24.2  . Smokeless tobacco: Never Used  Substance Use Topics  . Alcohol use: No  . Drug use: No   Additional social history: none  Please also refer to relevant sections of EMR.  Family History: Family History  Problem Relation Age of Onset  . Seizures Mother   . Heart failure Mother   . Intracerebral hemorrhage Mother   . Alzheimer's disease Mother   . Stroke Brother   . Heart attack Brother    Allergies and Medications: Allergies  Allergen Reactions  . Eggs Or Egg-Derived Products Anaphylaxis and Swelling    Throat and body swells (can only tolerated boiled eggs)  . Acetic Acid Other (See Comments)    Pt had initial headache and believes it led to brain bleeds and blindness  . 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 Other Other (See Comments)     Headaches lead to blindness oftentimes (starts with eyes fluttering, then progresses to stuttering)   No current facility-administered medications on file prior to encounter.    Current Outpatient Medications on File Prior to Encounter  Medication Sig Dispense Refill  . bisacodyl (DULCOLAX) 10 MG suppository Place 10 mg rectally daily as needed for mild constipation or moderate constipation. If not relieved bu MOM 1 dose in 24 hours    . escitalopram (LEXAPRO) 10 MG tablet Take 10 mg by mouth daily.    . feeding supplement, ENSURE ENLIVE, (ENSURE ENLIVE) LIQD Take 237 mLs by mouth 2 (two) times daily between meals. 237 mL 12  . ipratropium-albuterol (DUONEB) 0.5-2.5 (3) MG/3ML SOLN Take 3 mLs by nebulization every 6 (six) hours as needed. 360 mL   . labetalol (NORMODYNE) 100 MG tablet Take 1 tablet (100 mg total) by mouth 3 (three) times daily.    . magnesium hydroxide (MILK OF MAGNESIA) 400 MG/5ML suspension Take 30 mLs by mouth daily as needed for mild constipation. I dose in 24 hours    . Multiple Vitamin (MULTIVITAMIN WITH MINERALS) TABS tablet Take 1 tablet by mouth daily.    Marland Kitchen OVER THE COUNTER MEDICATION Take by mouth See admin instructions. Magic cup  At 1400 and 2000 is a 4oz frozen cup that provides 290 calories and 9 grams of protein in four  great flavors, and a No Sugar Added variety with 260 calories and 9 grams of protein. As an added benefit, it can be eaten frozen as an ice cream or after it thaws, a pudding - perfect for texture modified diets.    . pantoprazole sodium (PROTONIX) 40 mg/20 mL PACK Take 20 mLs (40 mg total) by mouth daily. 30 each   . pravastatin (PRAVACHOL) 80 MG tablet Take 80 mg by mouth daily. For    . QUEtiapine (SEROQUEL) 25 MG tablet Take 12.5 mg by mouth at bedtime. For depression     . senna-docusate (SENOKOT-S) 8.6-50 MG tablet Take 1 tablet by mouth 2 (two) times daily. For constipation    . Sodium Phosphates (ENEMA RE) Place 1 application rectally as  needed. Saline 1 dose 24 hours    . verapamil (CALAN) 40 MG tablet Take 40 mg by mouth every 8 (eight) hours. For Hypertension     Objective: BP (!) 124/91   Pulse (!) 106   Temp 98.3 F (36.8 C) (Axillary)   Resp (!) 23   SpO2 94%   Physical Exam Constitutional:      Appearance: He is ill-appearing.     Comments: Intermittently sleepy  Eyes:     Extraocular Movements: Extraocular movements intact.  Cardiovascular:     Rate and Rhythm: Regular rhythm. Tachycardia present.     Pulses: Normal pulses.     Heart sounds: Normal heart sounds. No murmur. No friction rub. No gallop.   Pulmonary:     Comments: Connected to BiPAP, coarse breath sounds auscultated throughout Abdominal:     General: Bowel sounds are normal.  Musculoskeletal:     Left lower leg: Edema (3 cm larger in size than right, warm to touch) present.  Neurological:     Comments: 3/5 strength of left upper and lower extremity; 5/5 strength to right upper and lower extremity    Labs and Imaging: CBC BMET  Recent Labs  Lab 03/07/19 0957  WBC 14.4*  HGB 13.5  HCT 42.5  PLT 382   Recent Labs  Lab 03/07/19 0957  NA 137  K 4.9  CL 103  CO2 18*  BUN 58*  CREATININE 1.91*  GLUCOSE 188*  CALCIUM 10.1     Troponin: 0.23 Lactic Acid: 3.0 > 1.3 Lipase wnl  Ct Head Wo Contrast  Result Date: 30-Jan-2019 CLINICAL DATA:  Follow-up intracranial hemorrhage EXAM: CT HEAD WITHOUT CONTRAST TECHNIQUE: Contiguous axial images were obtained from the base of the skull through the vertex without intravenous contrast. COMPARISON:  CT brain, 04/24/2018 FINDINGS: Brain: Interval decrease in volume and attenuation of a intraparenchymal hemorrhage centered in the right parietal lobe. There is similar edema and sulcal effacement with approximately 6 mm right-to-left midline shift, not significantly changed compared to prior examination. No evidence of new hemorrhage. Unchanged extensive periventricular white matter hypodensity.  Probable encephalomalacia of the left occipital lobe. Vascular: No hyperdense vessel or unexpected calcification. Skull: Normal. Negative for fracture or focal lesion. Sinuses/Orbits: No acute finding. Other: None. IMPRESSION: 1. Interval decrease in volume and attenuation of a intraparenchymal hemorrhage centered in the right parietal lobe. There is similar edema and sulcal effacement with approximately 6 mm right-to-left midline shift, not significantly changed compared to prior examination. No evidence of new hemorrhage. 2. Underlying small-vessel white matter disease and probable encephalomalacia of the left occipital lobe. Electronically Signed   By: Lauralyn PrimesAlex  Bibbey M.D.   On: 03/07/2019 14:31   Ct Angio Chest Pe W And/or Wo  Contrast  Result Date: 04/17/2018 CLINICAL DATA:  Shortness of breath EXAM: CT ANGIOGRAPHY CHEST WITH CONTRAST TECHNIQUE: Multidetector CT imaging of the chest was performed using the standard protocol during bolus administration of intravenous contrast. Multiplanar CT image reconstructions and MIPs were obtained to evaluate the vascular anatomy. CONTRAST:  75mL ISOVUE-370 IOPAMIDOL (ISOVUE-370) INJECTION 76% COMPARISON:  Same day chest radiograph FINDINGS: Cardiovascular: Positive examination for pulmonary embolism with saddle embolus and a large burden of lobar and more distal embolism present throughout. There is near occlusive embolus present in the right intralobar artery and more distally in the lobar and segmental arteries of the bilateral lower lobes. Embolus is present in all lobes although at the segmental level and more distally in the upper lobes. There is enlargement of the RV LV ratio to approximately 1.5. No pericardial effusion. Scattered coronary artery calcifications. Mediastinum/Nodes: No enlarged mediastinal, hilar, or axillary lymph nodes. Thyroid gland, trachea, and esophagus demonstrate no significant findings. Lungs/Pleura: There is subpleural ground-glass opacity  of the left greater than right lung bases, concerning for developing pulmonary infarctions distal to occlusive embolus. No pleural effusion or pneumothorax. Mild underlying emphysema. Upper Abdomen: No acute abnormality. Musculoskeletal: No chest wall abnormality. No acute or significant osseous findings. Review of the MIP images confirms the above findings. IMPRESSION: 1. Positive examination for pulmonary embolism with saddle embolus and a large burden of lobar and more distal embolism present throughout. There is near occlusive embolus present in the right intralobar artery and more distally in the lobar and segmental arteries of the bilateral lower lobes. Embolus is present in all lobes although at the segmental level and more distally in the upper lobes. 2. There is enlargement of the RV LV ratio to approximately 1.5, concerning for right heart strain. 3. There is subpleural ground-glass opacity of the left greater than right lung bases, concerning for developing pulmonary infarctions distal to occlusive embolus. 4.  Emphysema. 5.  Coronary artery disease. These results were called by telephone at the time of interpretation on 05/10/2018 at 2:23 pm to Dr. Lynden Oxford , who verbally acknowledged these results. Electronically Signed   By: Lauralyn Primes M.D.   On: 04/28/2018 14:23   Dg Chest Portable 1 View  Result Date: 05/09/2018 CLINICAL DATA:  63 year old male with a history of stroke and hypertension EXAM: PORTABLE CHEST 1 VIEW COMPARISON:  04/23/2018 FINDINGS: Cardiomediastinal silhouette unchanged in size and contour. New opacity at the left lung base in the retrocardiac region with blunting of left costophrenic angle. No pneumothorax. Crowding of the central vasculature. No evidence of interlobular septal thickening. Interval removal of right IJ central venous catheter. Scoliotic curvature. IMPRESSION: New opacity at the left lung base, potentially consolidation or atelectasis. Pleural effusion  not excluded. Interval removal of right IJ central catheter. Electronically Signed   By: Gilmer Mor D.O.   On: 05/03/2018 10:21   Dollene Cleveland, DO 04/23/2018, 3:58 PM PGY-1, [place] [facility] FPTS Intern pager: [phone], text pages welcome

## 2018-05-03 ENCOUNTER — Encounter (HOSPITAL_COMMUNITY): Payer: Medicare Other

## 2018-05-03 DIAGNOSIS — I2692 Saddle embolus of pulmonary artery without acute cor pulmonale: Secondary | ICD-10-CM

## 2018-05-03 DIAGNOSIS — R0603 Acute respiratory distress: Secondary | ICD-10-CM

## 2018-05-03 DIAGNOSIS — Z66 Do not resuscitate: Secondary | ICD-10-CM

## 2018-05-03 DIAGNOSIS — Z515 Encounter for palliative care: Secondary | ICD-10-CM

## 2018-05-03 LAB — CBC
HCT: 34 % — ABNORMAL LOW (ref 39.0–52.0)
Hemoglobin: 11.1 g/dL — ABNORMAL LOW (ref 13.0–17.0)
MCH: 26.9 pg (ref 26.0–34.0)
MCHC: 32.6 g/dL (ref 30.0–36.0)
MCV: 82.5 fL (ref 80.0–100.0)
Platelets: 316 10*3/uL (ref 150–400)
RBC: 4.12 MIL/uL — ABNORMAL LOW (ref 4.22–5.81)
RDW: 15.8 % — ABNORMAL HIGH (ref 11.5–15.5)
WBC: 17.3 10*3/uL — ABNORMAL HIGH (ref 4.0–10.5)
nRBC: 0 % (ref 0.0–0.2)

## 2018-05-03 LAB — URINALYSIS, ROUTINE W REFLEX MICROSCOPIC
Bilirubin Urine: NEGATIVE
Glucose, UA: NEGATIVE mg/dL
Hgb urine dipstick: NEGATIVE
Ketones, ur: 5 mg/dL — AB
Leukocytes,Ua: NEGATIVE
NITRITE: NEGATIVE
Protein, ur: NEGATIVE mg/dL
Specific Gravity, Urine: 1.031 — ABNORMAL HIGH (ref 1.005–1.030)
pH: 5 (ref 5.0–8.0)

## 2018-05-03 LAB — BASIC METABOLIC PANEL
Anion gap: 8 (ref 5–15)
BUN: 46 mg/dL — ABNORMAL HIGH (ref 8–23)
CO2: 20 mmol/L — ABNORMAL LOW (ref 22–32)
Calcium: 9.3 mg/dL (ref 8.9–10.3)
Chloride: 114 mmol/L — ABNORMAL HIGH (ref 98–111)
Creatinine, Ser: 1.12 mg/dL (ref 0.61–1.24)
GFR calc Af Amer: >60 mL/min (ref >60)
GFR calc non Af Amer: >60 mL/min (ref >60)
Glucose, Bld: 95 mg/dL (ref 70–99)
Potassium: 5.2 mmol/L — ABNORMAL HIGH (ref 3.5–5.1)
SODIUM: 142 mmol/L (ref 135–145)

## 2018-05-03 MED ORDER — MORPHINE SULFATE (PF) 2 MG/ML IV SOLN
2.0000 mg | INTRAVENOUS | Status: DC | PRN
  Administered 2018-05-03 – 2018-05-04 (×7): 2 mg via INTRAVENOUS
  Filled 2018-05-03 (×8): qty 1

## 2018-05-03 MED ORDER — CHLORHEXIDINE GLUCONATE 0.12 % MT SOLN: 15.0000 mL | Freq: Two times a day (BID) | OROMUCOSAL | Status: DC

## 2018-05-03 MED ORDER — MORPHINE SULFATE (PF) 2 MG/ML IV SOLN
2.0000 mg | INTRAVENOUS | Status: DC | PRN
  Administered 2018-05-03 (×3): 2 mg via INTRAVENOUS
  Filled 2018-05-03 (×3): qty 1

## 2018-05-03 MED ORDER — VANCOMYCIN HCL 10 G IV SOLR
1250.0000 mg | INTRAVENOUS | Status: DC
  Filled 2018-05-03: qty 1250

## 2018-05-03 MED ORDER — ORAL CARE MOUTH RINSE: 15.0000 mL | Freq: Two times a day (BID) | OROMUCOSAL | Status: DC

## 2018-05-03 MED ORDER — LORAZEPAM 2 MG/ML IJ SOLN
1.0000 mg | INTRAMUSCULAR | Status: DC | PRN
  Administered 2018-05-03 – 2018-05-04 (×3): 1 mg via INTRAVENOUS
  Filled 2018-05-03 (×3): qty 1

## 2018-05-03 MED ORDER — GLYCOPYRROLATE 0.2 MG/ML IJ SOLN
0.4000 mg | Freq: Four times a day (QID) | INTRAMUSCULAR | Status: DC
  Administered 2018-05-03 – 2018-05-04 (×4): 0.4 mg via INTRAVENOUS
  Filled 2018-05-03 (×4): qty 2

## 2018-05-03 MED ORDER — SODIUM CHLORIDE 0.9 % IV SOLN
1.0000 g | Freq: Three times a day (TID) | INTRAVENOUS | Status: DC
  Administered 2018-05-03: 1 g via INTRAVENOUS
  Filled 2018-05-03: qty 1

## 2018-05-03 NOTE — Progress Notes (Signed)
NAME:  Sahmir Bassette, MRN:  [id number], DOB:  06-08-1955, LOS: 1 ADMISSION DATE:  05/21/18, CONSULTATION DATE:  04/17/18 REFERRING MD: Lynden Oxford CHIEF COMPLAINT:  PE, Pneumonia, respiratory distress.   Brief History   Dennison Labombard is a 63 y.o. male past medical history significant for hypertension, hyperlipidemia, COPD, and recent hemorrhagic stroke ( discharged to rehab  2/15)  who presents from his rehab facility for respiratory distress.  Patient brought in by EMS . Family realize patient was having difficulty breathing and called EMS, who noted  his oxygen saturations were in the 60s on arrival. Sats improved only slightly on Non-re-breather. nonrebreather . He presented with respiratory distress, tachycardia, wheezing and rhonchi with desaturations requiring BiPAP. CTA was + for PE and pneumonia. PCCM have been asked to consult for assistance with treatment for pneumonia and PE.  History of present illness   Rohn Welden is a 63 y.o. male past medical history significant for hypertension, hyperlipidemia, COPD, and recent hemorrhagic stroke ( discharged to rehab  2/15)  who presents from his rehab facility for respiratory distress. He has had decreased mobility .  Patient brought in by EMS . Family realize patient was having difficulty breathing and called EMS, who noted  his oxygen saturations were in the 60s on arrival. Sats improved only slightly on Non-re-breather. He presented to the ED  with respiratory distress, tachycardia, wheezing and rhonchi with desaturations requiring BiPAP. He has improved on BiPAP. He did tell EMS he was having chest pain while en route.CTA was + for PE and pneumonia. PCCM have been asked to consult for assistance with treatment for pneumonia and PE.  CT Head on admission was still positive for intraparenchymal hemorrhage centered in the right parietal lobe with similar edema and sulcal effacement, still 34mm R to L MLS which is unchanged.  Anticoagulation and  EKOS were ruled out as options for care due to his recent hemorraghic bleed, and continued blood noted on CT head today. Filter placement can be considered by IR.  PCCM have been consulted to assist with management of pneumonia and hypoxemia.   Goals of care addressed on admission and pt admitted as DNR status with BiPAP as needed for increased WOB and desaturations.  Past Medical History  ICH, SAH, HTN, amyloid angiopathy, recent hemorrhagic stroke 04/2018.  Significant Hospital Events   2/18 >> Admission  Consults:  PCCM.  Procedures:  BiPAP 2/18  Significant Diagnostic Tests:  2/18 CT Head > Interval decrease in volume and attenuation of a intraparenchymal hemorrhage centered in the right parietal lobe. There is similar edema and sulcal effacement with approximately 6 mm right-to-left midline shift, not significantly changed,  nonew hemorrhage. 05-21-2018 CTA > PE with saddle embolus and large lobar and more distal embolism throughout.  RV / LV ~ 1.5. GGO's L > R base. Echo 04/17/2018 > EF >65%. The cavity size is normal.  LE duplex 2/19 >   Micro Data:  2018/05/21 Blood Cultures>>  Antimicrobials:  Maxipime 21-May-2018>> Vanc 21-May-2018>>  Interim history/subjective:  On BiPAP at 70% FiO2 at 10/6. Sleeping comfortably.  Objective:  Blood pressure (!) 169/122, pulse (!) 110, temperature 98.3 F (36.8 C), temperature source Oral, resp. rate 18, height 5\' 4"  (1.626 m), weight 75.1 kg, SpO2 95 %.    FiO2 (%):  [70 %] 70 %   Intake/Output Summary (Last 24 hours) at 05/03/2018 0847 Last data filed at 05/03/2018 0534 Gross per 24 hour  Intake -  Output 560 ml  Net -560  ml   Filed Weights   05/03/2018 1829  Weight: 75.1 kg    Examination: General: Adult male, in NAD, on BiPAP. Neuro: Sleepy but arouses easily to voice, does not follow all commands HEENT: [place]/AT. Sclerae anicteric.  BiPAP mask Cardiovascular: S1, S2, RRR, no M/R/G.  Lungs:Bilateral chest excursion noted, few  rhonchi noted on auscultation  Abdomen: BS x 4, soft, NT/ND.  Musculoskeletal: No gross deformities, no edema.  Skin: Intact, warm, no rashes or lesions.  Assessment & Plan:   Acute hypoxic respiratory failure - due to new PE and new opacity at the left lung base concerning for HCAP - Continue BiPAP PRN for increased WOB. - Trial off BiPAP since he required most of the night.  Can use HFNC in place given desaturations - Morphine PRN air hunger - NPO until more awake - No anticoagulation given recent IPH.  Not a candidate for EKOS for same reasons. - F/u on LE duplex, will likely require IVC filter - Aggressive Bronchial hygiene - ABX as above and follow cultures - CXR intermittently  Goals of care. - Full DNR on admission.   Nothing further to add.  PCCM will sign off.  Please do not hesitate to call us back if we can be of any further assistance.   Best Practice:  Diet: NPO until more awake. Pain/Anxiety/Delirium protocol (if indicated): N/A VAP protocol (if indicated): N/A DVT prophylaxis: None GI prophylaxis: None Glucose control: None. Mobility: Bedrest. Code Status: DMR.  Family Communication: None available. Disposition: Progressive.   Rutherford Guys, PA [facility] Pager: [phone].  If no answer, [phone] 05/03/2018, 8:59 AM

## 2018-05-03 NOTE — Progress Notes (Signed)
Attempted pt on HFNC but pt did not tolerate.  HR increased to 125 RR increased to 30's and Sats decreased to mid 80's  Placed pt back on BiPAP  He is resting comfortably now

## 2018-05-03 NOTE — Consult Note (Signed)
Consultation Note Date: 05/03/2018   Patient Name: Luis Johnson  DOB: [birth year]  MRN: [id number]  Age / Sex: 63 y.o., male  PCP: [facility] Referring Physician: McDiarmid, Leighton Roach, MD  Reason for Consultation: Establishing goals of care and Psychosocial/spiritual support  HPI/Patient Profile: 63 y.o. male   admitted on 04/20/2018 with past medical history significant for hypertension, hyperlipidemia, COPD, and recent hemorrhagic stroke (discharged to rehab  2/15)  who presents from his rehab facility for respiratory distress.   Patient brought in by EMS . Family realize patient was having difficulty breathing and called EMS, who noted  his oxygen saturations were in the 60s on arrival. Sats improved only slightly on Non-re-breather. nonrebreather . He presented with respiratory distress, tachycardia, wheezing and rhonchi with desaturations requiring BiPAP.   CTA was + for PE and pneumonia. PCCM have been asked to consult for assistance with treatment for pneumonia and PE.  CT Head on admission was still positive for intraparenchymal hemorrhage centered in the right parietal lobe with similar edema and sulcal effacement, still 53mm R to L MLS which is unchanged.  Patient has continued to fail to thrive in spite of medical interventions.  Family faces treatment option decisions, advanced directive decisions, and anticipatory care needs.  Clinical Assessment and Goals of Care:  This NP Lorinda Creed reviewed medical records, received report from team, assessed the patient and then meet at the patient's bedside along with his wife, her sister/Martha, and daughter/Cheree  to discuss diagnosis, prognosis, GOC, EOL wishes disposition and options.  Concept of Hospice and Palliative Care were discussed  A detailed discussion was had today regarding advanced directives.  Concepts specific to code status,  artifical feeding and hydration, continued IV antibiotics and rehospitalization was had.  The difference between a aggressive medical intervention path  and a palliative comfort care path for this patient at this time was had.  Values and goals of care important to patient and family were attempted to be elicited.  Family was able to verbalize her understanding of the limited prognosis and their desire to shift to a full comfort path knowing that this is "the right thing to do"  Hard choices left for review  Natural trajectory and expectations at EOL were discussed.  Questions and concerns addressed.   Family encouraged to call with questions or concerns.    PMT will continue to support holistically.   NEXT OF KIN    SUMMARY OF RECOMMENDATIONS    Code Status/Advance Care Planning:  DNR    Symptom Management:   Dyspnea/Pain: Morphine 2 mg IV every 1 hr prn  Agitation: Ativan 1 mg IV every 4 hrs prn  Terminal secretions: Robinul    Palliative Prophylaxis:   Aspiration, Frequent Pain Assessment and Oral Care  Additional Recommendations (Limitations, Scope, Preferences):  Full Comfort Care  Psycho-social/Spiritual:   Desire for further Chaplaincy support:yes  Additional Recommendations: Education on Hospice and Grief/Bereavement Support  Prognosis:   Hours - Days  Discharge Planning: Anticipated Hospital Death  Primary Diagnoses: Present on Admission: . Saddle pulmonary embolus (HCC) . Acute respiratory failure with hypoxemia (HCC) . Cerebral amyloid angiopathy (HCC) . ICH (intracerebral hemorrhage) (HCC) . Acute cor pulmonale due to saddle embolus of pulmonary artery (HCC)   I have reviewed the medical record, interviewed the patient and family, and examined the patient. The following aspects are pertinent.  Past Medical History:  Diagnosis Date  . Acute respiratory failure with hypoxemia (HCC) 04/28/2018  . Hypertension   . Stroke [facility](HCC)    Social  History   Socioeconomic History  . Marital status: Married    Spouse name: Not on file  . Number of children: Not on file  . Years of education: Not on file  . Highest education level: Not on file  Occupational History  . Not on file  Social Needs  . Financial resource strain: Not on file  . Food insecurity:    Worry: Not on file    Inability: Not on file  . Transportation needs:    Medical: Not on file    Non-medical: Not on file  Tobacco Use  . Smoking status: Former Smoker    Last attempt to quit: 01/21/1994    Years since quitting: 24.2  . Smokeless tobacco: Never Used  Substance and Sexual Activity  . Alcohol use: No  . Drug use: No  . Sexual activity: Not on file  Lifestyle  . Physical activity:    Days per week: Not on file    Minutes per session: Not on file  . Stress: Not on file  Relationships  . Social connections:    Talks on phone: Not on file    Gets together: Not on file    Attends religious service: Not on file    Active member of club or organization: Not on file    Attends meetings of clubs or organizations: Not on file    Relationship status: Not on file  Other Topics Concern  . Not on file  Social History Narrative  . Not on file   Family History  Problem Relation Age of Onset  . Seizures Mother   . Heart failure Mother   . Intracerebral hemorrhage Mother   . Alzheimer's disease Mother   . Stroke Brother   . Heart attack Brother    Scheduled Meds: . chlorhexidine  15 mL Mouth Rinse BID  . mouth rinse  15 mL Mouth Rinse q12n4p   Continuous Infusions: . sodium chloride 125 mL/hr at 05/03/18 1044  . ceFEPime (MAXIPIME) IV    . vancomycin     PRN Meds:.morphine injection Medications Prior to Admission:  Prior to Admission medications   Medication Sig Start Date End Date Taking? Authorizing Provider  bisacodyl (DULCOLAX) 10 MG suppository Place 10 mg rectally daily as needed for mild constipation or moderate constipation. If not  relieved bu MOM 1 dose in 24 hours   Yes [provider]  escitalopram (LEXAPRO) 10 MG tablet Take 10 mg by mouth daily.   Yes [provider]  feeding supplement, ENSURE ENLIVE, (ENSURE ENLIVE) LIQD Take 237 mLs by mouth 2 (two) times daily between meals. 04/27/18  Yes Layne BentonBiby, Sharon L, NP  ipratropium-albuterol (DUONEB) 0.5-2.5 (3) MG/3ML SOLN Take 3 mLs by nebulization every 6 (six) hours as needed. 04/27/18  Yes Layne BentonBiby, Sharon L, NP  labetalol (NORMODYNE) 100 MG tablet Take 1 tablet (100 mg total) by mouth 3 (three) times daily. 04/27/18  Yes Layne BentonBiby, Sharon L, NP  magnesium hydroxide (MILK  OF MAGNESIA) 400 MG/5ML suspension Take 30 mLs by mouth daily as needed for mild constipation. I dose in 24 hours   Yes [provider]  Multiple Vitamin (MULTIVITAMIN WITH MINERALS) TABS tablet Take 1 tablet by mouth daily. 04/28/18  Yes Layne BentonBiby, Sharon L, NP  OVER THE COUNTER MEDICATION Take by mouth See admin instructions. Magic cup  At 1400 and 2000 is a 4oz frozen cup that provides 290 calories and 9 grams of protein in four great flavors, and a No Sugar Added variety with 260 calories and 9 grams of protein. As an added benefit, it can be eaten frozen as an ice cream or after it thaws, a pudding - perfect for texture modified diets.   Yes [provider]  pantoprazole sodium (PROTONIX) 40 mg/20 mL PACK Take 20 mLs (40 mg total) by mouth daily. 04/28/18  Yes Layne BentonBiby, Sharon L, NP  pravastatin (PRAVACHOL) 80 MG tablet Take 80 mg by mouth daily. For 12/13/17  Yes [provider]  QUEtiapine (SEROQUEL) 25 MG tablet Take 12.5 mg by mouth at bedtime. For depression    Yes [provider]  senna-docusate (SENOKOT-S) 8.6-50 MG tablet Take 1 tablet by mouth 2 (two) times daily. For constipation   Yes [provider]  Sodium Phosphates (ENEMA RE) Place 1 application rectally as needed. Saline 1 dose 24 hours   Yes [provider]  verapamil (CALAN) 40 MG tablet  Take 40 mg by mouth every 8 (eight) hours. For Hypertension   Yes [provider]   Allergies  Allergen Reactions  . Eggs Or Egg-Derived Products Anaphylaxis and Swelling    Throat and body swells (can only tolerated boiled eggs)  . Acetic Acid Other (See Comments)    Pt had initial headache and believes it led to brain bleeds and blindness  . 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 Other Other (See Comments)    Headaches lead to blindness oftentimes (starts with eyes fluttering, then progresses to stuttering)   Review of Systems  Unable to perform ROS: Acuity of condition    Physical Exam Constitutional:      Appearance: He is underweight. He is ill-appearing.     Interventions: Face mask in place.     Comments: - agitated and confused  Cardiovascular:     Rate and Rhythm: Tachycardia present.  Pulmonary:     Effort: Tachypnea present.     Breath sounds: Decreased breath sounds present.  Skin:    General: Skin is warm and dry.     Vital Signs: BP (!) 169/122   Pulse (!) 110   Temp 98.3 F (36.8 C) (Oral)   Resp 18   Ht 5\' 4"  (1.626 m)   Wt 75.1 kg   SpO2 92%   BMI 28.42 kg/m  Pain Scale: Faces   Pain Score: Asleep   SpO2: SpO2: 92 % O2 Device:SpO2: 92 % O2 Flow Rate: .   IO: Intake/output summary:   Intake/Output Summary (Last 24 hours) at 05/03/2018 1119 Last data filed at 05/03/2018 0534 Gross per 24 hour  Intake -  Output 560 ml  Net -560 ml    LBM: Last BM Date: (PTA) Baseline Weight: Weight: 75.1 kg Most recent weight: Weight: 75.1 kg     Palliative Assessment/Data:   20 %   Flowsheet Rows     Most Recent Value  Intake Tab  Unit at Time of Referral  ER  Palliative Care Primary Diagnosis  Neurology  Date Notified  May 10, 2018  Palliative Care Type  New Palliative care  Reason for referral  Clarify Goals of Care  Date of Admission  2018-05-10  # of days IP prior to Palliative referral  0    Clinical Assessment  Psychosocial & Spiritual Assessment  Palliative Care Outcomes      Discussed with Dr Dareen Piano  Time In: 0945 Time Out: 1100 Time Total: 75 minutes Greater than 50%  of this time was spent counseling and coordinating care related to the above assessment and plan.  Signed by: Lorinda Creed, NP   Please contact Palliative Medicine Team phone at [phone] for questions and concerns.  For individual provider: See Loretha Stapler

## 2018-05-03 NOTE — Progress Notes (Addendum)
Family Medicine Teaching Service Daily Progress Note Intern Pager: [phone]  Patient name: Luis Johnson Medical record number: [id number] Date of birth: October 23, 1955 Age: 63 y.o. Gender: male  Primary Care Provider: [facility] Consultants: Palliative, CCM Code Status: DNR  Pt Overview and Major Events to Date:  2/18 - Admitted  Assessment and Plan: Hodges Manspeaker is a 63 y.o. male presenting with shortness of breath. PMH is significant for recent intracranial hemorrhagic stroke, hypertension, depression, COPD, hyperlipidemia, and respiratory failure  Acute respiratory failure with hypoxemia due to to saddle pulmonary embolus, ?HCAP: Patient remains on BiPAP.  Satting 95%, heart rate around 110, afebrile.  Trial was performed to remove BiPAP and switched to nasal cannula overnight, but patient quickly desatted and was placed back on BiPAP. CTA showing pulmonary embolism with saddle embolus, embolus present in all lobes, and subpleural groundglass opacity of the left lung base concerning for developing pulmonary infarction.  Left lower extremity noted to be warm to the touch and measuring 3 cm greater in size than right lower extremity, suspicious for source of emboli. EKG showing sinus tachycardia without sign of STEMI. -Palliative care consult - patient and family wish for comfort care -Patient's wife needs notary  -Cefepime, Vancomycin for HCAP coverage -Patient n.p.o. while on BiPAP -IV maintenance fluids -Morphine, Ativan PRN Pain, agitation  HTN: BP 122-83 - 169/122, patient takes labetalol 100 mg 3 times daily and verapamil 40 mg every 8 hours at home -No treatment at this time  AKI, improved: Cr on admission noted to be 1.91, 1.12 on 2/19, baseline around 0.95. -Do not plan to monitor -mIVFs ______________________________________________________________  Plan for management of the following chronic, stable conditions: -Hold meds while on BiPAP -We will follow-up after  discussion with palliative care  HLD: Patient takes pravastatin 80 mg daily  Depression: Patient takes Lexapro 10 mg daily and Seroquel 12.5 mg at bedtime  GERD: Patient takes 40 mg Protonix by mouth daily  FEN/GI: mIVFs, NPO while on BiPAP Prophylaxis: None  Disposition: Progressive unit  Subjective:  Patient seen resting in bed this morning with BiPAP in place, wife and sister-in-law Johnny Bridge at bedside.  He is resting comfortably at this time, occasionally opens his eyes to speak.  Very difficult to understand due to BiPAP.  Objective: Temp:  [98 F (36.7 C)-98.3 F (36.8 C)] 98.3 F (36.8 C) (02/19 0700) Pulse Rate:  [95-131] 110 (02/19 0824) Resp:  [17-33] 18 (02/19 0824) BP: (99-169)/(60-122) 169/122 (02/19 0700) SpO2:  [58 %-100 %] 95 % (02/19 0824) FiO2 (%):  [70 %] 70 % (02/19 0824) Weight:  [75.1 kg] 75.1 kg (02/18 1829)  Physical Exam: General: Lying in bed, BiPAP in place Cardiovascular: Tachycardic to 110, Respiratory: Coarse breath sounds, fewer breath sounds in Left lung, remains on BiPAP Abdomen: bowel sounds 4 quadrants, nondistended Extremities: left lower extremity remains warm to touch and swollen; no signs of edema or DVT in R lower extremity  Laboratory: Recent Labs  Lab 04/27/18 0559 2018-05-13 0957 05/03/18 0334  WBC 12.9* 14.4* 17.3*  HGB 11.8* 13.5 11.1*  HCT 36.4* 42.5 34.0*  PLT 295 382 316   Recent Labs  Lab 04/27/18 0559 05/13/18 0957 05/03/18 0334  NA 142 137 142  K 4.5 4.9 5.2*  CL 108 103 114*  CO2 22 18* 20*  BUN 19 58* 46*  CREATININE 0.95 1.91* 1.12  CALCIUM 9.4 10.1 9.3  PROT  --  7.7  --   BILITOT  --  0.9  --  ALKPHOS  --  129*  --   ALT  --  129*  --   AST  --  58*  --   GLUCOSE 121* 188* 95   Urinalysis    Component Value Date/Time   COLORURINE YELLOW 05/09/2018 0942   APPEARANCEUR CLEAR 05/01/2018 0942   LABSPEC 1.031 (H) 04/21/2018 0942   PHURINE 5.0 05/10/2018 0942   GLUCOSEU NEGATIVE 04/26/2018 0942    HGBUR NEGATIVE 04/24/2018 0942   BILIRUBINUR NEGATIVE 04/24/2018 0942   KETONESUR 5 (A) 04/25/2018 0942   PROTEINUR NEGATIVE 04/30/2018 0942   UROBILINOGEN 0.2 11/12/2014 1015   NITRITE NEGATIVE 04/19/2018 0942   LEUKOCYTESUR NEGATIVE 05/09/2018 0942   Imaging/Diagnostic Tests: Ct Head Wo Contrast Result Date: 05/12/2018 IMPRESSION: 1. Interval decrease in volume and attenuation of a intraparenchymal hemorrhage centered in the right parietal lobe. There is similar edema and sulcal effacement with approximately 6 mm right-to-left midline shift, not significantly changed compared to prior examination. No evidence of new hemorrhage. 2. Underlying small-vessel white matter disease and probable encephalomalacia of the left occipital lobe. Electronically Signed   By: Lauralyn Primes M.D.   On: 04/18/2018 14:31   Ct Angio Chest Pe W And/or Wo Contrast Result Date: 05/03/2018  IMPRESSION: 1. Positive examination for pulmonary embolism with saddle embolus and a large burden of lobar and more distal embolism present throughout. There is near occlusive embolus present in the right intralobar artery and more distally in the lobar and segmental arteries of the bilateral lower lobes. Embolus is present in all lobes although at the segmental level and more distally in the upper lobes. 2. There is enlargement of the RV LV ratio to approximately 1.5, concerning for right heart strain. 3. There is subpleural ground-glass opacity of the left greater than right lung bases, concerning for developing pulmonary infarctions distal to occlusive embolus. 4.  Emphysema. 5.  Coronary artery disease. These results were called by telephone at the time of interpretation on 04/23/2018 at 2:23 pm to Dr. Lynden Oxford , who verbally acknowledged these results. Electronically Signed   By: Lauralyn Primes M.D.   On: 05/11/2018 14:23   Dg Chest Portable 1 View Result Date: 04/26/2018 IMPRESSION: New opacity at the left lung base,  potentially consolidation or atelectasis. Pleural effusion not excluded. Interval removal of right IJ central catheter. Electronically Signed   By: Gilmer Mor D.O.   On: 05/09/2018 10:21   Dollene Cleveland, DO 05/03/2018, 9:33 AM PGY-1, [facility] FPTS Intern pager: [phone], text pages welcome

## 2018-05-03 NOTE — Progress Notes (Signed)
Pt taken off of BiPAP and placed on 2lpm. Pt is pallative care. No distress noted.

## 2018-05-03 NOTE — Progress Notes (Signed)
   05/03/18 1300  Clinical Encounter Type  Visited With Patient and family together  Visit Type Initial;Spiritual support;Patient actively dying  Referral From Palliative care team  Spiritual Encounters  Spiritual Needs Emotional;Grief support;Prayer  Stress Factors  Patient Stress Factors Other (Comment) (actively dying)  Family Stress Factors Major life changes;Loss of control   Responded to PMT consult request.  Met w/ pt (who was talking w/ everyone), wife, and 3 other family members at bedside.  They were praying and praising God.  Introduced myself, compassionate presence, led in prayer at their okay, drawing on input from all of them, including pt, for a peaceful passing and for God's answers to prayers thus far.  Pt noted "I am not now [in pain] b/c I have Jesus."  Affirmed their sense of comfort and hope found in their faith.  Apprised pt and family that they make ask for a chaplain to return again if desired.  All expressed gratitude.  Myra Gianotti resident, [phone]

## 2018-05-03 NOTE — Progress Notes (Signed)
Pharmacy Antibiotic Note  Luis Johnson is a 63 y.o. male admitted on 2018/05/23 with pneumonia.  Pharmacy has been consulted for cefepime and vancomycin dosing.  SCr decreased to 1.12. WBC trending up.   Vancomycin 1250 mg IV Q 24 hrs. Goal AUC 400-550. Expected AUC: 455.9 SCr used: 1.12  Plan: Vancomycin 1250mg  IV Q24h Start Cefepime 1g IV Q8h Monitor clinical picture, renal function, vanc levels prn F/U C&S, abx deescalation / LOT    Height: 5\' 4"  (162.6 cm) Weight: 165 lb 9.1 oz (75.1 kg) IBW/kg (Calculated) : 59.2  Temp (24hrs), Avg:98.1 F (36.7 C), Min:98 F (36.7 C), Max:98.3 F (36.8 C)  Recent Labs  Lab 04/27/18 0559 2018-05-23 0941 23-May-2018 0957 05-23-18 1210 05/03/18 0334  WBC 12.9*  --  14.4*  --  17.3*  CREATININE 0.95  --  1.91*  --  1.12  LATICACIDVEN  --  3.0*  --  1.3  --     Estimated Creatinine Clearance: 61.8 mL/min (by C-G formula based on SCr of 1.12 mg/dL).    Allergies  Allergen Reactions  . Eggs Or Egg-Derived Products Anaphylaxis and Swelling    Throat and body swells (can only tolerated boiled eggs)  . Acetic Acid Other (See Comments)    Pt had initial headache and believes it led to brain bleeds and blindness  . 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 Other Other (See Comments)    Headaches lead to blindness oftentimes (starts with eyes fluttering, then progresses to stuttering)    Thank you for allowing pharmacy to be a part of this patient's care.  Gwynneth Albright, Ilda Basset D PGY1 Pharmacy Resident  Phone [phone] 05/03/2018   7:34 AM

## 2018-05-03 NOTE — Progress Notes (Signed)
Patient is restless , agitated, HR sustained in the 130's and said, "he can,t breath". Wife called the nurse. RRT called and on call provider notified. Order received for Morphine Q 2 hrs as needed. Wife voiced to this nurse that "he just want him to rest'.

## 2018-05-04 ENCOUNTER — Encounter (HOSPITAL_COMMUNITY): Payer: Medicare Other

## 2018-05-04 DIAGNOSIS — R0600 Dyspnea, unspecified: Secondary | ICD-10-CM

## 2018-05-04 DIAGNOSIS — R0609 Other forms of dyspnea: Secondary | ICD-10-CM

## 2018-05-04 DIAGNOSIS — Z515 Encounter for palliative care: Secondary | ICD-10-CM

## 2018-05-04 LAB — URINE CULTURE: Culture: NO GROWTH

## 2018-05-04 MED ORDER — MORPHINE SULFATE (PF) 2 MG/ML IV SOLN
2.0000 mg | INTRAVENOUS | Status: AC
  Administered 2018-05-04: 2 mg via INTRAVENOUS

## 2018-05-04 MED ORDER — LORAZEPAM 2 MG/ML IJ SOLN
1.0000 mg | INTRAMUSCULAR | Status: DC | PRN
  Administered 2018-05-04 (×3): 1 mg via INTRAVENOUS
  Filled 2018-05-04 (×3): qty 1

## 2018-05-04 MED ORDER — MORPHINE BOLUS VIA INFUSION
1.0000 mg | INTRAVENOUS | Status: DC | PRN
  Administered 2018-05-04 (×2): 1 mg via INTRAVENOUS
  Filled 2018-05-04: qty 1

## 2018-05-04 MED ORDER — MORPHINE 100MG IN NS 100ML (1MG/ML) PREMIX INFUSION
1.0000 mg/h | INTRAVENOUS | Status: DC
  Administered 2018-05-04: 6 mg/h via INTRAVENOUS
  Administered 2018-05-04: 1 mg/h via INTRAVENOUS
  Filled 2018-05-04: qty 100

## 2018-05-04 MED ORDER — WHITE PETROLATUM EX OINT
TOPICAL_OINTMENT | CUTANEOUS | Status: AC
  Filled 2018-05-04: qty 28.35

## 2018-05-04 MED ORDER — BISACODYL 10 MG RE SUPP: 10.0000 mg | Freq: Every day | RECTAL | Status: DC | PRN

## 2018-05-07 LAB — CULTURE, BLOOD (ROUTINE X 2)
Culture: NO GROWTH
Culture: NO GROWTH
Special Requests: ADEQUATE

## 2018-05-14 NOTE — Progress Notes (Addendum)
Pt passed at 1835 witnessed by wife, [name], per RN report from day shift.

## 2018-05-14 NOTE — Progress Notes (Signed)
Family Medicine Teaching Service Daily Progress Note Intern Pager: [phone]  Patient name: Luis Johnson Medical record number: [id number] Date of birth: 08-Jul-1955 Age: 63 y.o. Gender: male  Primary Care Provider: [facility] Consultants: Palliative, CCM Code Status: DNR  Pt Overview and Major Events to Date:  2/18 - Admitted  Assessment and Plan: Luis Johnson a 62 y.o.malepresenting with shortness of breath. PMH is significant forrecent intracranial hemorrhagic stroke, hypertension, depression, COPD, hyperlipidemia, and respiratory failure  Acute respiratory failure with hypoxemia due to to saddle pulmonary embolus, ?HCAP:  Patient now on high flow nasal cannula. CTA showing pulmonary embolism with saddle embolus, embolus present in all lobes,and subpleural groundglass opacity of the left lung base concerning for developing pulmonary infarction.  -Palliative care consult - patient and family wish for comfort care -Stop Antibiotics  -Stop IV maintenance fluids -Morphine PRN Pain -Ativan PRN Agitation -Diet Ad lib  HTN:BP 169/122, patient takes labetalol 100 mg 3 times daily and verapamil 40 mg every 8 hoursat home -No treatment at this time  AKI, improved: Cr on admission noted to be 1.91, 1.12 on 2/19, baseline around 0.95. -Do not plan to monitor ______________________________________________________________  Plan for management of the following chronic, stable conditions: -Hold meds while on BiPAP -We will follow-up after discussion with palliative care  GOT:LXBWIOM takes pravastatin 80 mg daily  Depression: Patient takes Lexapro 10 mg daily and Seroquel 12.5 mg at bedtime  GERD: Patient takes 40 mg Protonix by mouth daily  FEN/GI:mIVFs,NPO while on BiPAP Prophylaxis:None  Disposition: Anticipated hospital death  Subjective:  Patient resting in bed, moving all 4 extremities spontaneously.  Nasal cannula is in place.  No family at  bedside at this time.  Patient unable to answer questions or express concerns.  Objective: Pulse Rate:  [110] 110 (02/19 0824) Resp:  [18] 18 (02/19 0824) SpO2:  [92 %-95 %] 92 % (02/19 0900) FiO2 (%):  [50 %-70 %] 50 % (02/19 1105) Physical Exam: General: No apparent distress, frail appearing male Cardiovascular: Regular rate rhythm, S1-S2 present, no murmurs, rubs, gallops Respiratory: CTA bilaterally Abdomen: Normal bowel sounds auscultated in 4 quadrants Extremities: 2+ nonpitting edema to left lower extremity, no edema to right lower extremity  Laboratory: Recent Labs  Lab 04/17/2018 0957 05/03/18 0334  WBC 14.4* 17.3*  HGB 13.5 11.1*  HCT 42.5 34.0*  PLT 382 316   Recent Labs  Lab 05/12/2018 0957 05/03/18 0334  NA 137 142  K 4.9 5.2*  CL 103 114*  CO2 18* 20*  BUN 58* 46*  CREATININE 1.91* 1.12  CALCIUM 10.1 9.3  PROT 7.7  --   BILITOT 0.9  --   ALKPHOS 129*  --   ALT 129*  --   AST 58*  --   GLUCOSE 188* 95   Imaging/Diagnostic Tests: No new imaging  Dollene Cleveland, DO 06/01/2018, 7:20 AM PGY-1, [facility] FPTS Intern pager: [phone], text pages welcome

## 2018-05-14 NOTE — Progress Notes (Signed)
20-May-2018 Morphine 30 cc was wasted between [employer] and Eber Jones Rn at [employer].

## 2018-05-14 NOTE — Death Summary Note (Signed)
Family Medicine Teaching [facility] Death Summary  Patient name: Luis Johnson Medical record number: [id number] Date of birth: May 24, 1955 Age: 63 y.o. Gender: male Date of Admission: 05-29-18  Date of Death: June 01, 2018 Admitting Physician: Leighton Roach McDiarmid, MD  Primary Care Provider: [facility] Consultants: CCM, palliative  Indication for Hospitalization: Shortness of breath  Diagnoses/Problem List:  Acute respiratory failure with hypoxemia secondary to saddle pulmonary embolism Acute cor pulmonale Suspected left leg DVT Cerebral amyloid angiopathy Intracerebral hemorrhage AKI Hypertension Hyperlipidemia Depression GERD  Disposition: In-hospital death  Brief Hospital Course:  Luis Johnson was admitted for acute shortness of breath, found to have saddle pulmonary embolism on CTA chest.  The patient was placed on BiPAP in the emergency department.  Due to the patient's recent history of intracerebral hemorrhage the patient was not a candidate for pharmacological therapy to address the pulmonary embolism due to increased risk of bleed.  The patient and the patient's family decided for comfort care.  The patient was taken off of BiPAP and placed on nasal cannula.  Patient passed away at 1835 on 2018/05/31.  Significant Procedures: None  Significant Labs and Imaging:  Recent Labs  Lab May 29, 2018 0957 05/03/18 0334  WBC 14.4* 17.3*  HGB 13.5 11.1*  HCT 42.5 34.0*  PLT 382 316   Recent Labs  Lab May 29, 2018 0957 05/03/18 0334  NA 137 142  K 4.9 5.2*  CL 103 114*  CO2 18* 20*  GLUCOSE 188* 95  BUN 58* 46*  CREATININE 1.91* 1.12  CALCIUM 10.1 9.3  ALKPHOS 129*  --   AST 58*  --   ALT 129*  --   ALBUMIN 2.6*  --    Ct Head Wo Contrast Result Date: 2018/05/29 CLINICAL DATA:  Follow-up intracranial hemorrhage EXAM: CT HEAD WITHOUT CONTRAST TECHNIQUE: Contiguous axial images were obtained from the base of the skull through the vertex without intravenous  contrast. COMPARISON:  CT brain, 04/24/2018 FINDINGS: Brain: Interval decrease in volume and attenuation of a intraparenchymal hemorrhage centered in the right parietal lobe. There is similar edema and sulcal effacement with approximately 6 mm right-to-left midline shift, not significantly changed compared to prior examination. No evidence of new hemorrhage. Unchanged extensive periventricular white matter hypodensity. Probable encephalomalacia of the left occipital lobe. Vascular: No hyperdense vessel or unexpected calcification. Skull: Normal. Negative for fracture or focal lesion. Sinuses/Orbits: No acute finding. Other: None. IMPRESSION: 1. Interval decrease in volume and attenuation of a intraparenchymal hemorrhage centered in the right parietal lobe. There is similar edema and sulcal effacement with approximately 6 mm right-to-left midline shift, not significantly changed compared to prior examination. No evidence of new hemorrhage. 2. Underlying small-vessel white matter disease and probable encephalomalacia of the left occipital lobe. Electronically Signed   By: Lauralyn Primes M.D.   On: 05/29/2018 14:31   Ct Angio Chest Pe W And/or Wo Contrast Result Date: May 29, 2018 CLINICAL DATA:  Shortness of breath EXAM: CT ANGIOGRAPHY CHEST WITH CONTRAST TECHNIQUE: Multidetector CT imaging of the chest was performed using the standard protocol during bolus administration of intravenous contrast. Multiplanar CT image reconstructions and MIPs were obtained to evaluate the vascular anatomy. CONTRAST:  9mL ISOVUE-370 IOPAMIDOL (ISOVUE-370) INJECTION 76% COMPARISON:  Same day chest radiograph FINDINGS: Cardiovascular: Positive examination for pulmonary embolism with saddle embolus and a large burden of lobar and more distal embolism present throughout. There is near occlusive embolus present in the right intralobar artery and more distally in the lobar and segmental arteries of the bilateral lower lobes.  Embolus is  present in all lobes although at the segmental level and more distally in the upper lobes. There is enlargement of the RV LV ratio to approximately 1.5. No pericardial effusion. Scattered coronary artery calcifications. Mediastinum/Nodes: No enlarged mediastinal, hilar, or axillary lymph nodes. Thyroid gland, trachea, and esophagus demonstrate no significant findings. Lungs/Pleura: There is subpleural ground-glass opacity of the left greater than right lung bases, concerning for developing pulmonary infarctions distal to occlusive embolus. No pleural effusion or pneumothorax. Mild underlying emphysema. Upper Abdomen: No acute abnormality. Musculoskeletal: No chest wall abnormality. No acute or significant osseous findings. Review of the MIP images confirms the above findings. IMPRESSION: 1. Positive examination for pulmonary embolism with saddle embolus and a large burden of lobar and more distal embolism present throughout. There is near occlusive embolus present in the right intralobar artery and more distally in the lobar and segmental arteries of the bilateral lower lobes. Embolus is present in all lobes although at the segmental level and more distally in the upper lobes. 2. There is enlargement of the RV LV ratio to approximately 1.5, concerning for right heart strain. 3. There is subpleural ground-glass opacity of the left greater than right lung bases, concerning for developing pulmonary infarctions distal to occlusive embolus. 4.  Emphysema. 5.  Coronary artery disease. These results were called by telephone at the time of interpretation on 05-10-18 at 2:23 pm to Dr. Lynden Oxford , who verbally acknowledged these results. Electronically Signed   By: Lauralyn Primes M.D.   On: 05/10/18 14:23   Dg Chest Portable 1 View Result Date: 2018/05/10 CLINICAL DATA:  63 year old male with a history of stroke and hypertension EXAM: PORTABLE CHEST 1 VIEW COMPARISON:  04/23/2018 FINDINGS: Cardiomediastinal  silhouette unchanged in size and contour. New opacity at the left lung base in the retrocardiac region with blunting of left costophrenic angle. No pneumothorax. Crowding of the central vasculature. No evidence of interlobular septal thickening. Interval removal of right IJ central venous catheter. Scoliotic curvature. IMPRESSION: New opacity at the left lung base, potentially consolidation or atelectasis. Pleural effusion not excluded. Interval removal of right IJ central catheter. Electronically Signed   By: Gilmer Mor D.O.   On: 2018-05-10 10:21   Dollene Cleveland, DO 05/09/2018, 1:13 AM PGY-1, [facility]

## 2018-05-14 NOTE — Progress Notes (Signed)
Patient ID: Luis Johnson, male   DOB: Jul 26, 1955, 63 y.o.   MRN: [id number]  This NP visited patient at the bedside as a follow up to  yesterday's GOCs meeting for palliative medcine needs and emotional support.  Wife at bedside she is at peace with decision for full comfort and understands the limited prognosis.  Emotional support offered.  Focus of care is comfort and dignity. Prognosis is likely hours to days.  Patient appears generally uncomfortable with increased work of breathing and facial grimacing.  --Morphine drip started with boluses , place Foley catheter --Utilize prn Ativan  Questions and concerns addressed   Discussed with Dr Juanna Cao Family Medicine  Total time spent on the unit was 35 minutes  Greater than 50% of the time was spent in counseling and coordination of care  Lorinda Creed NP  Palliative Medicine Team Team Phone # [phone] Pager [phone]

## 2018-05-14 DEATH — deceased

## 2018-06-26 ENCOUNTER — Ambulatory Visit: Payer: Medicare Other | Admitting: Adult Health

## 2019-05-25 IMAGING — CT CT ANGIO CHEST
2 of 8 series · 18 of 46 positions shown · IV contrast (iopamidol)
Comparison: Same day chest radiograph

CLINICAL DATA: Shortness of breath

EXAM:
CT ANGIOGRAPHY CHEST WITH CONTRAST
TECHNIQUE: Multidetector CT imaging of the chest was performed using the
standard protocol during bolus administration of intravenous
contrast. Multiplanar CT image reconstructions and MIPs were
obtained to evaluate the vascular anatomy.
CONTRAST:  75mL [id number] IOPAMIDOL ([id number]) INJECTION 76%

[Series 6: thins · axial · 0.79mm/px · z∈[+917,+1144]mm · 15 of 251 slices shown]
[im 12/251  lung]
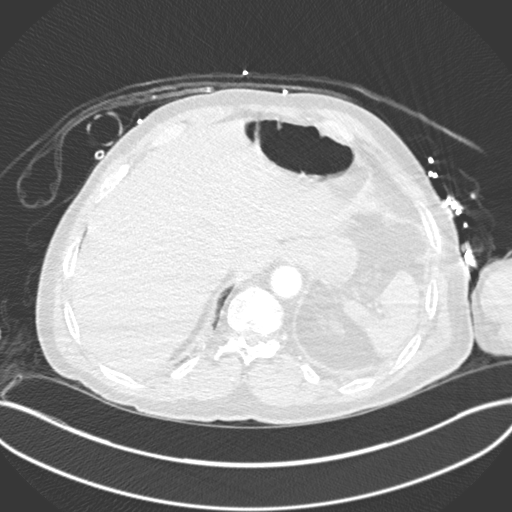
[im 35/251  soft-tissue]
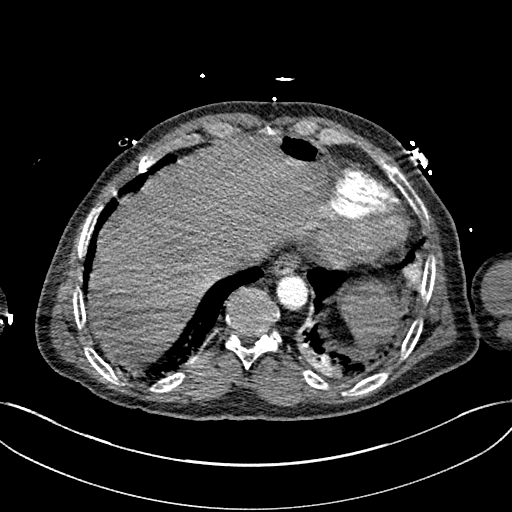
[im 46/251  lung]
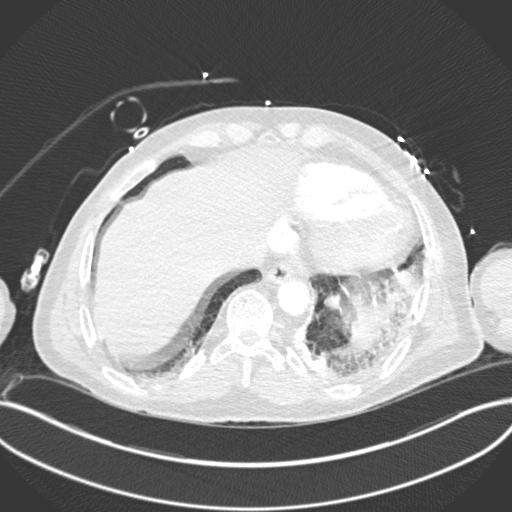
[im 57/251  soft-tissue]
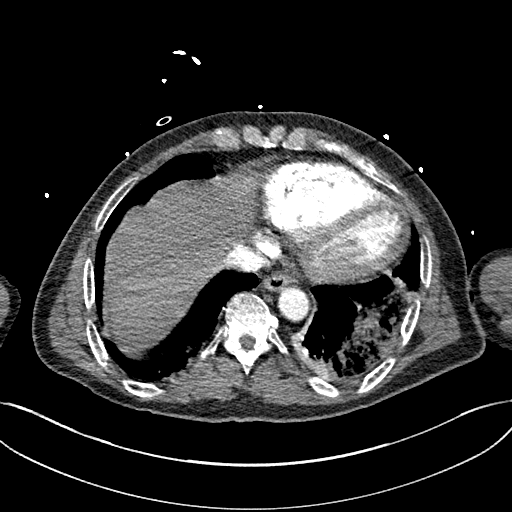
[im 80/251  lung]
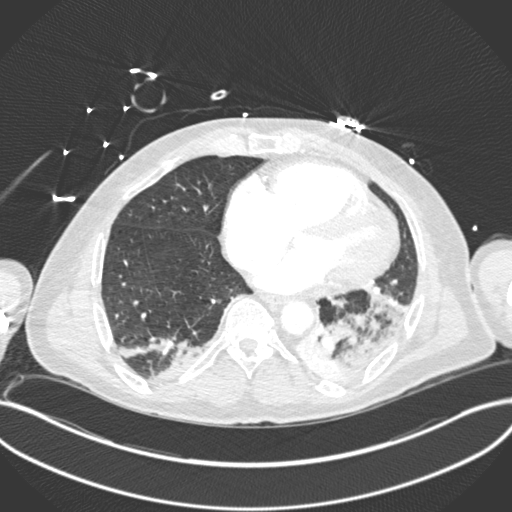
[im 91/251  soft-tissue]
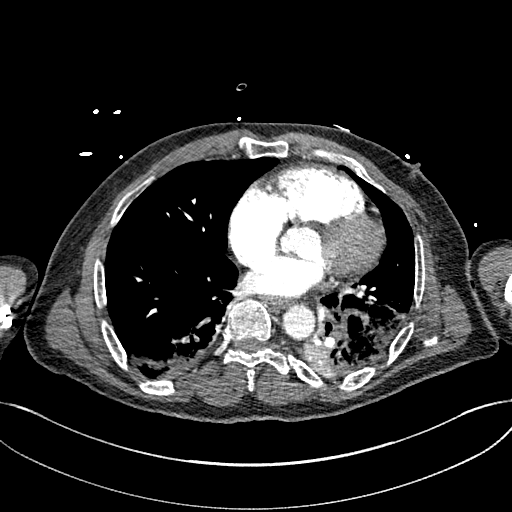
[im 114/251  lung]
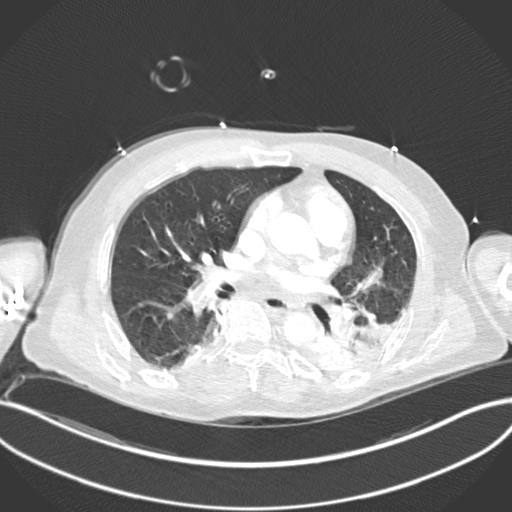
[im 126/251  soft-tissue]
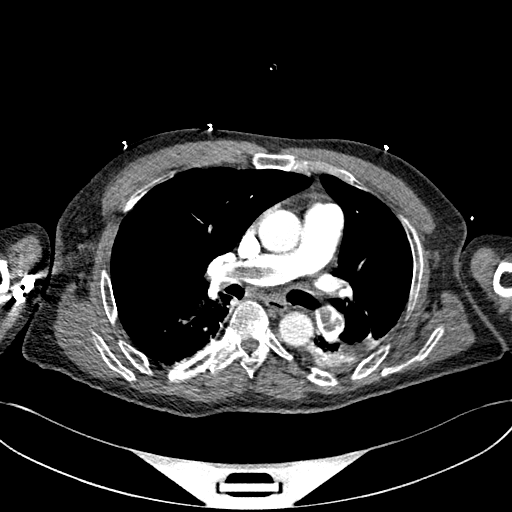
[im 137/251  lung]
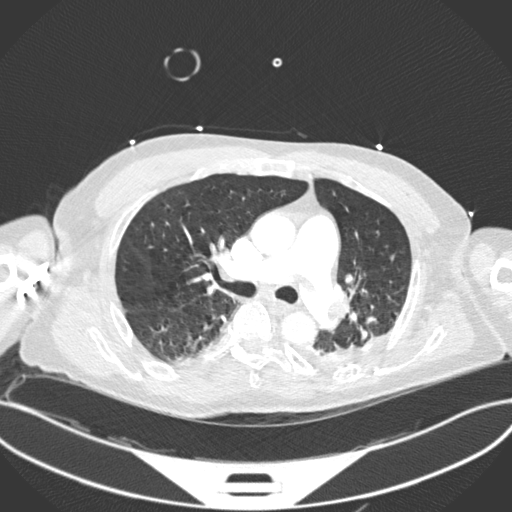
[im 160/251  soft-tissue]
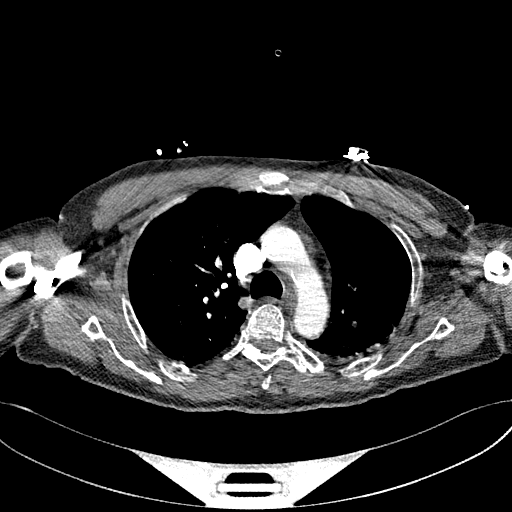
[im 171/251  lung]
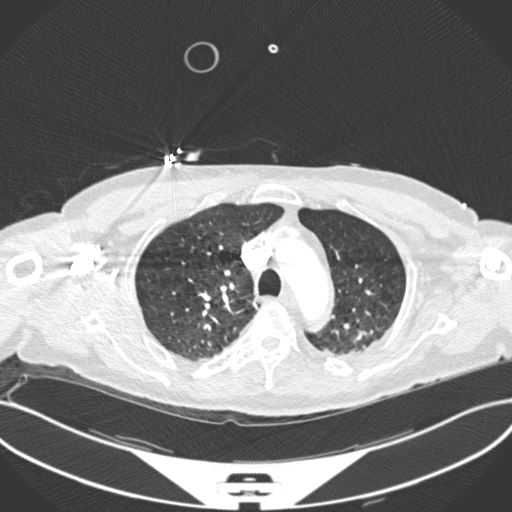
[im 194/251  soft-tissue]
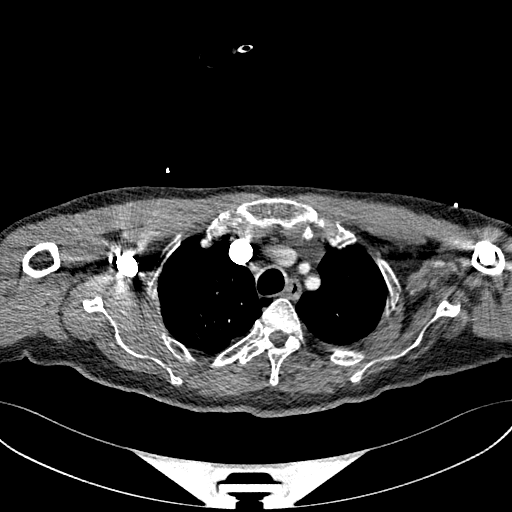
[im 205/251  lung]
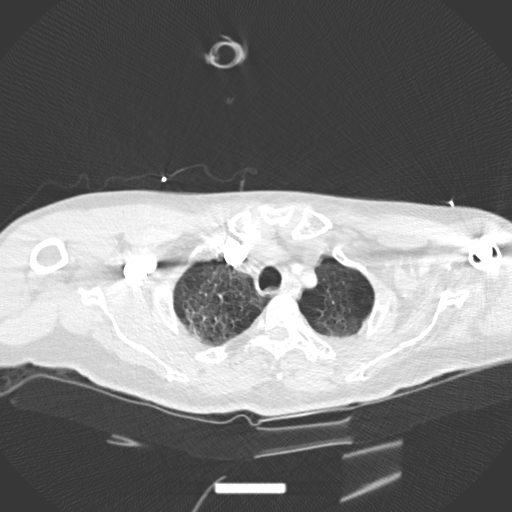
[im 216/251  soft-tissue]
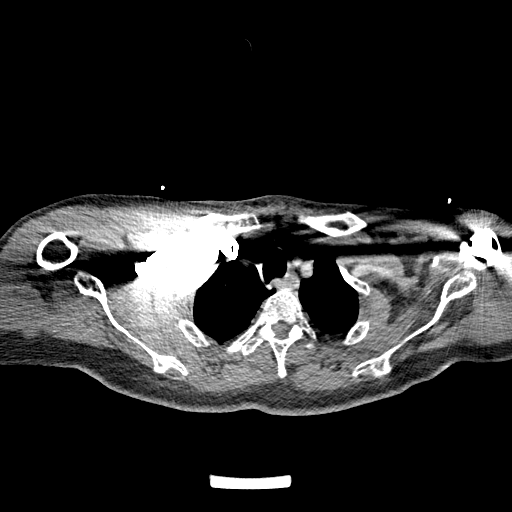
[im 239/251  lung]
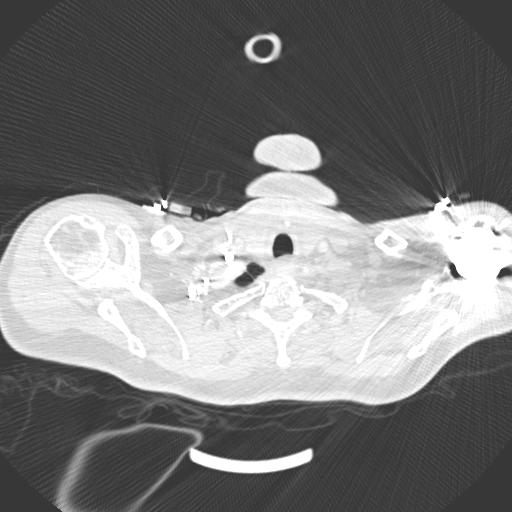

[Series 8: coronal mpr · coronal · 0.52mm/px · 3 of 151 slices shown]
[im 38/151  soft-tissue]
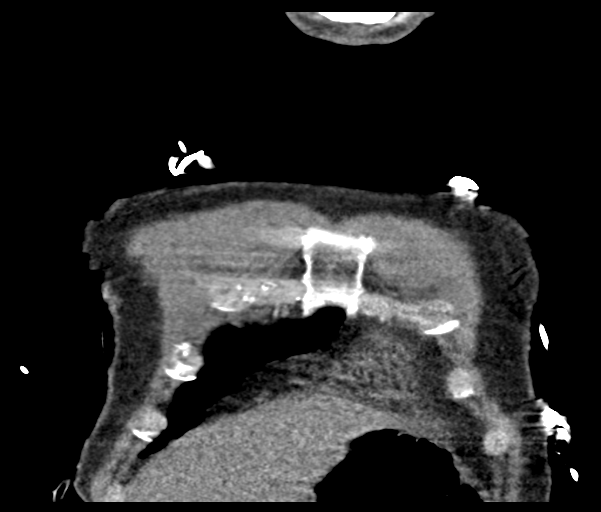
[im 76/151  soft-tissue]
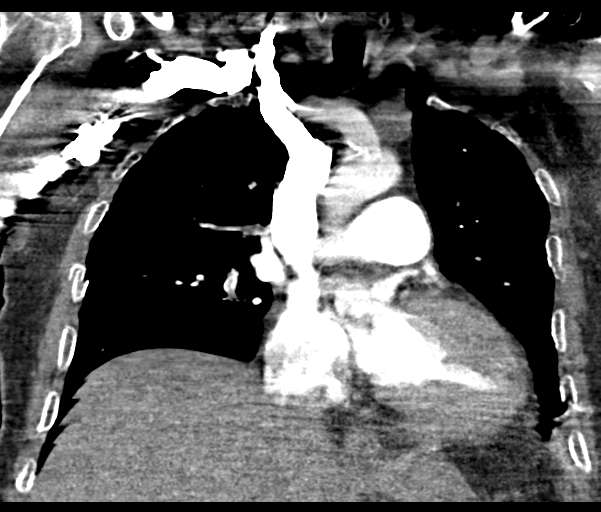
[im 113/151  soft-tissue]
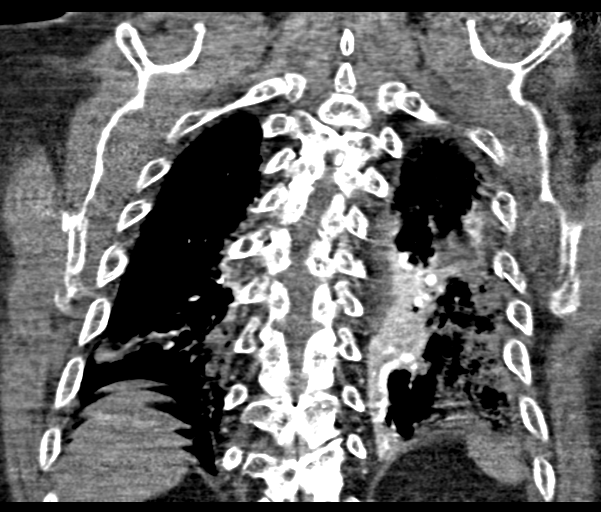

[18 of 46 positions shown; findings below may reference images not displayed]

FINDINGS: Cardiovascular: Positive examination for pulmonary embolism with
saddle embolus and a large burden of lobar and more distal embolism
present throughout. There is near occlusive embolus present in the
right intralobar artery and more distally in the lobar and segmental
arteries of the bilateral lower lobes. Embolus is present in all
lobes although at the segmental level and more distally in the upper
lobes. There is enlargement of the RV LV ratio to approximately 1.5.
No pericardial effusion. Scattered coronary artery calcifications.

Mediastinum/Nodes: No enlarged mediastinal, hilar, or axillary lymph
nodes. Thyroid gland, trachea, and esophagus demonstrate no
significant findings.

Lungs/Pleura: There is subpleural ground-glass opacity of the left
greater than right lung bases, concerning for developing pulmonary
infarctions distal to occlusive embolus. No pleural effusion or
pneumothorax. Mild underlying emphysema.

Upper Abdomen: No acute abnormality.

Musculoskeletal: No chest wall abnormality. No acute or significant
osseous findings.

Review of the MIP images confirms the above findings.
IMPRESSION: 1. Positive examination for pulmonary embolism with saddle embolus
and a large burden of lobar and more distal embolism present
throughout. There is near occlusive embolus present in the right
intralobar artery and more distally in the lobar and segmental
arteries of the bilateral lower lobes. Embolus is present in all
lobes although at the segmental level and more distally in the upper
lobes.

2. There is enlargement of the RV LV ratio to approximately 1.5,
concerning for right heart strain.

3. There is subpleural ground-glass opacity of the left greater than
right lung bases, concerning for developing pulmonary infarctions
distal to occlusive embolus.

4.  Emphysema.

5.  Coronary artery disease.

These results were called by telephone at the time of interpretation
on 05/02/2018 at [DATE] to Dr. SUBGHAT VALIZADA , who verbally
acknowledged these results.

## 2019-05-25 IMAGING — CT CT HEAD W/O CM
3 of 4 series · 14 of 47 positions shown, 16 images · non-contrast
Comparison: CT brain, 04/24/2018

CLINICAL DATA: Follow-up intracranial hemorrhage

EXAM:
CT HEAD WITHOUT CONTRAST
TECHNIQUE: Contiguous axial images were obtained from the base of the skull
through the vertex without intravenous contrast.

[Series 3: head 5.0 h30s · axial · 0.47mm/px · z∈[+1271,+1401]mm · 8 of 32 slices shown, 10 images]
[im 3/32  brain]
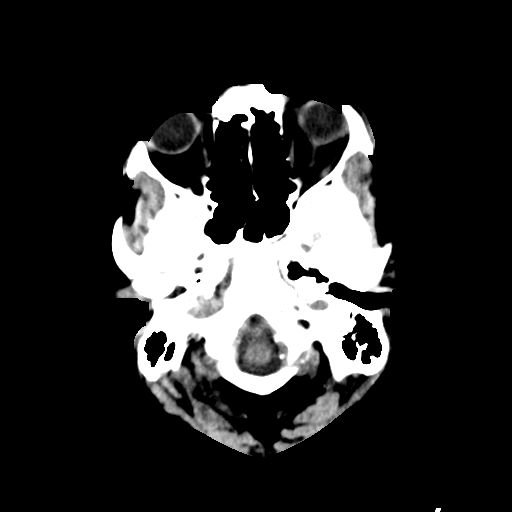
[im 3/32  bone]
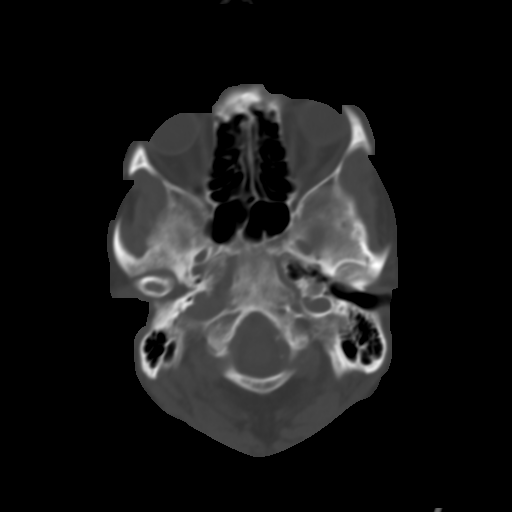
[im 7/32  brain]
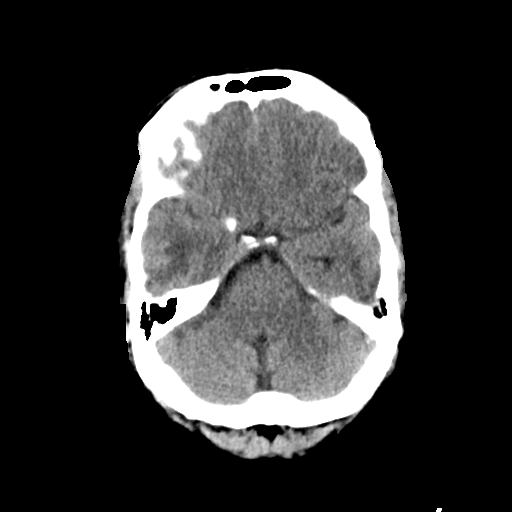
[im 12/32  brain]
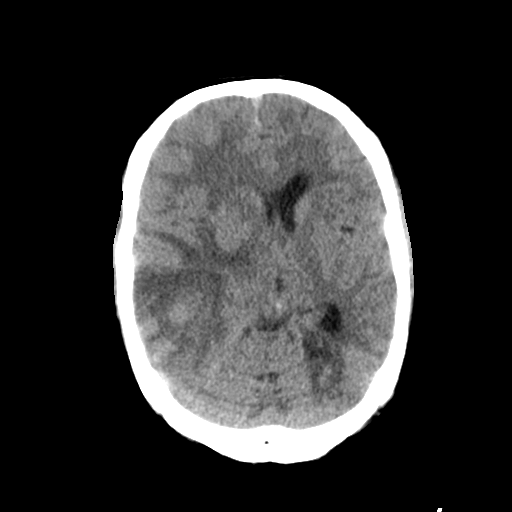
[im 14/32  brain]
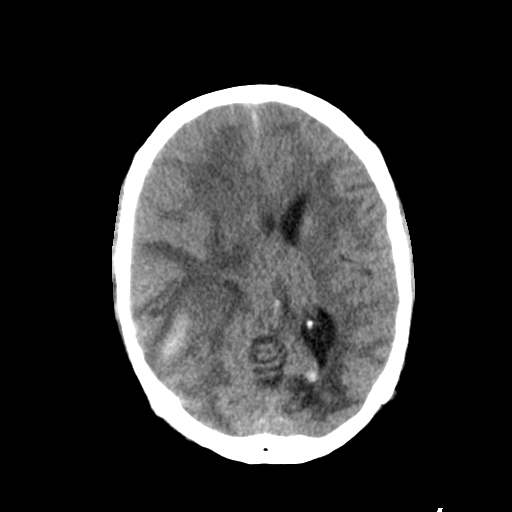
[im 18/32  brain]
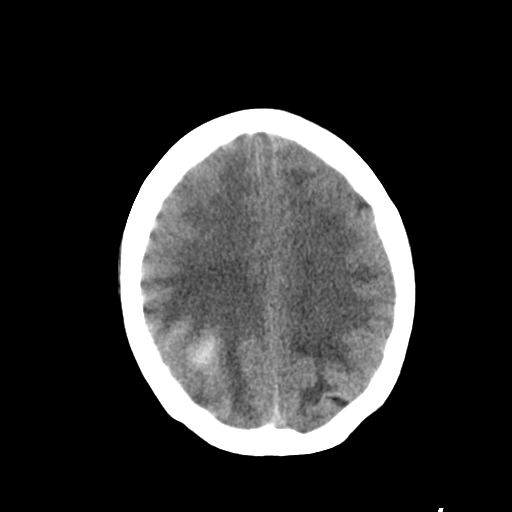
[im 18/32  bone]
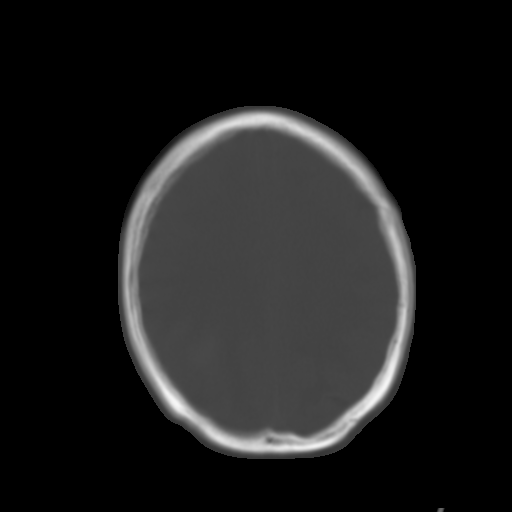
[im 20/32  brain]
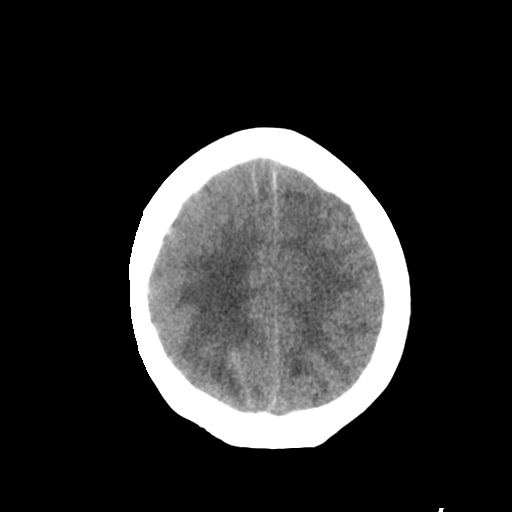
[im 25/32  brain]
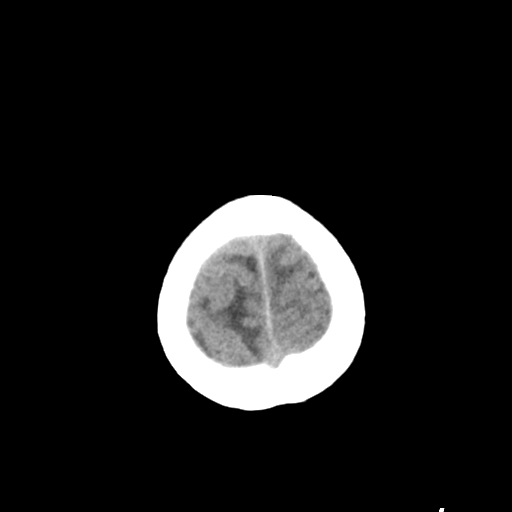
[im 29/32  brain]
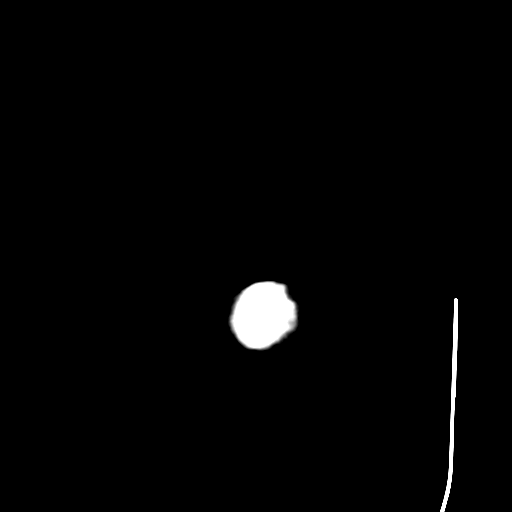

[Series 6: head 3.0 mpr cor · coronal · 0.31mm/px · 3 of 67 slices shown]
[im 23/67  brain]
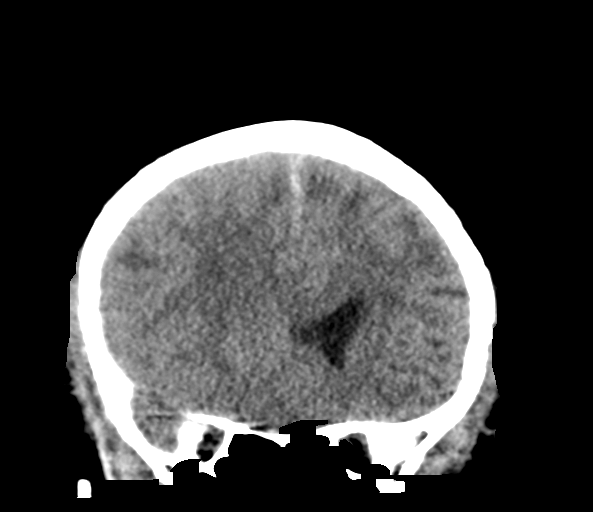
[im 30/67  brain]
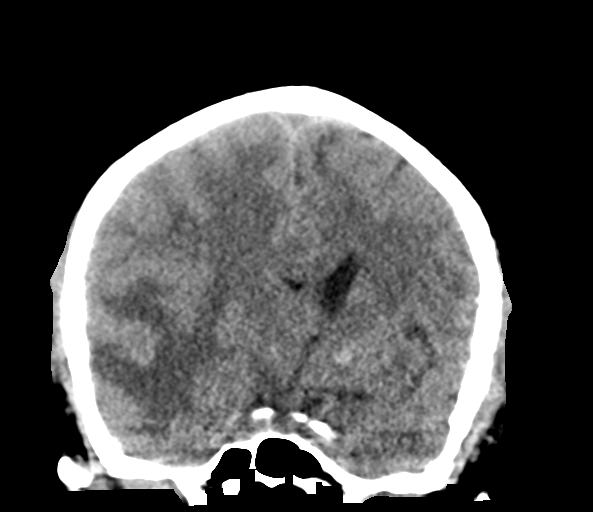
[im 37/67  brain]
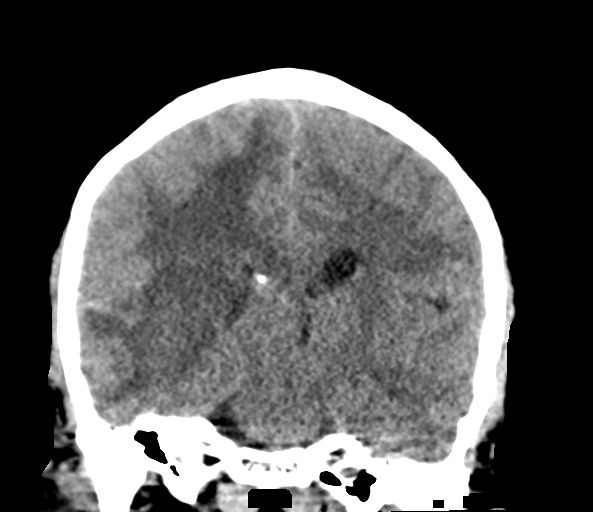

[Series 7: head 3.0 mpr sag · sagittal · 0.34mm/px · 3 of 61 slices shown]
[im 21/61  brain]
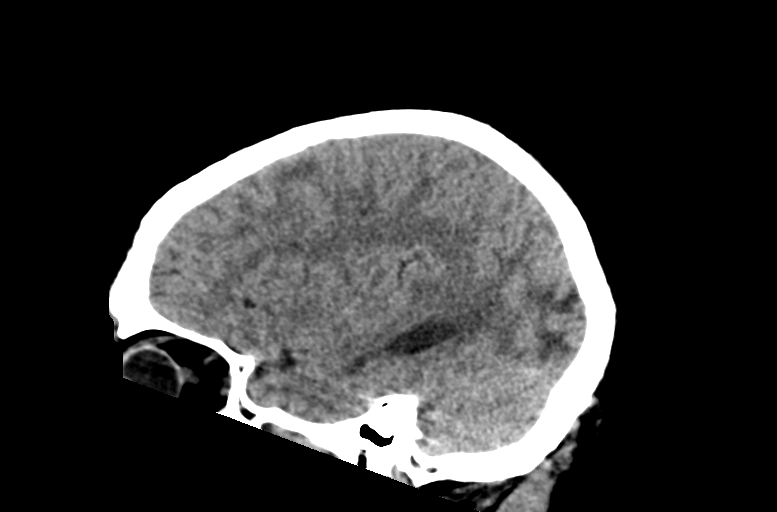
[im 31/61  brain]
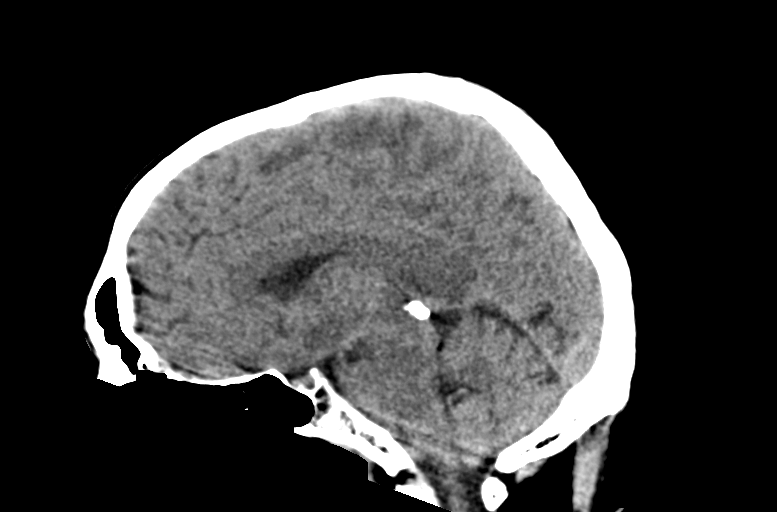
[im 41/61  brain]
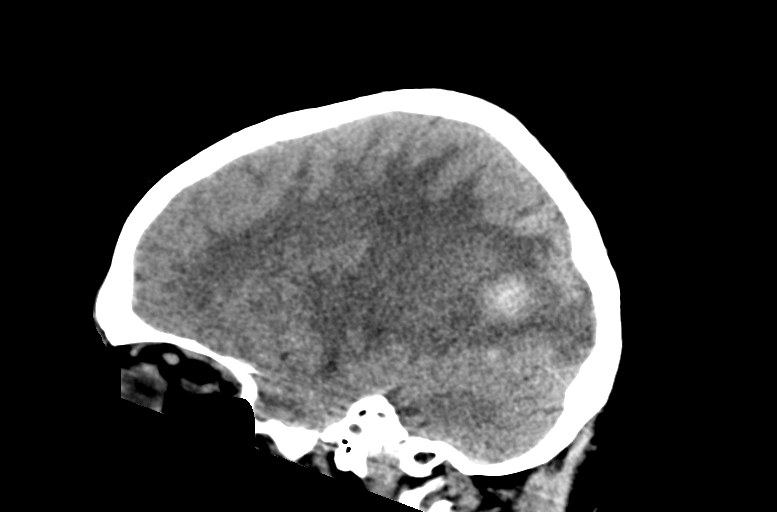

[14 of 47 positions shown; findings below may reference images not displayed]

FINDINGS: Brain: Interval decrease in volume and attenuation of a
intraparenchymal hemorrhage centered in the right parietal lobe.
There is similar edema and sulcal effacement with approximately 6 mm
right-to-left midline shift, not significantly changed compared to
prior examination. No evidence of new hemorrhage. Unchanged
extensive periventricular white matter hypodensity. Probable
encephalomalacia of the left occipital lobe.

Vascular: No hyperdense vessel or unexpected calcification.

Skull: Normal. Negative for fracture or focal lesion.

Sinuses/Orbits: No acute finding.

Other: None.
IMPRESSION: 1. Interval decrease in volume and attenuation of a intraparenchymal
hemorrhage centered in the right parietal lobe. There is similar
edema and sulcal effacement with approximately 6 mm right-to-left
midline shift, not significantly changed compared to prior
examination. No evidence of new hemorrhage.

2. Underlying small-vessel white matter disease and probable
encephalomalacia of the left occipital lobe.
